# Patient Record
Sex: Female | Born: 1960 | Race: White | Hispanic: No | Smoking: Current every day smoker
Health system: Southern US, Community
[De-identification: ages and names within clinical notes are randomized; demographics above are authoritative.]

## PROBLEM LIST (undated history)

## (undated) DIAGNOSIS — K7460 Unspecified cirrhosis of liver: Secondary | ICD-10-CM

## (undated) DIAGNOSIS — I509 Heart failure, unspecified: Secondary | ICD-10-CM

## (undated) DIAGNOSIS — J449 Chronic obstructive pulmonary disease, unspecified: Secondary | ICD-10-CM

## (undated) DIAGNOSIS — I10 Essential (primary) hypertension: Secondary | ICD-10-CM

## (undated) DIAGNOSIS — E119 Type 2 diabetes mellitus without complications: Secondary | ICD-10-CM

## (undated) DIAGNOSIS — B1920 Unspecified viral hepatitis C without hepatic coma: Secondary | ICD-10-CM

## (undated) DIAGNOSIS — I2510 Atherosclerotic heart disease of native coronary artery without angina pectoris: Secondary | ICD-10-CM

## (undated) HISTORY — PX: BACK SURGERY: SHX140

## (undated) HISTORY — PX: CARDIAC SURGERY: SHX584

---

## 2000-12-27 ENCOUNTER — Encounter: Payer: Self-pay | Admitting: Emergency Medicine

## 2000-12-27 ENCOUNTER — Emergency Department (HOSPITAL_COMMUNITY): Admission: EM | Admit: 2000-12-27 | Discharge: 2000-12-27 | Payer: Self-pay | Admitting: Emergency Medicine

## 2001-01-12 ENCOUNTER — Encounter: Payer: Self-pay | Admitting: Neurosurgery

## 2001-01-12 ENCOUNTER — Ambulatory Visit (HOSPITAL_COMMUNITY): Admission: RE | Admit: 2001-01-12 | Discharge: 2001-01-12 | Payer: Self-pay | Admitting: Neurosurgery

## 2001-05-03 ENCOUNTER — Emergency Department (HOSPITAL_COMMUNITY): Admission: EM | Admit: 2001-05-03 | Discharge: 2001-05-03 | Payer: Self-pay | Admitting: Emergency Medicine

## 2003-03-03 ENCOUNTER — Other Ambulatory Visit: Payer: Self-pay

## 2003-05-27 ENCOUNTER — Other Ambulatory Visit: Payer: Self-pay

## 2003-11-23 ENCOUNTER — Inpatient Hospital Stay (HOSPITAL_COMMUNITY): Admission: RE | Admit: 2003-11-23 | Discharge: 2003-11-24 | Payer: Self-pay | Admitting: Neurosurgery

## 2003-12-29 ENCOUNTER — Encounter: Admission: RE | Admit: 2003-12-29 | Discharge: 2003-12-29 | Payer: Self-pay | Admitting: Neurosurgery

## 2004-01-06 ENCOUNTER — Other Ambulatory Visit: Payer: Self-pay

## 2004-01-16 ENCOUNTER — Encounter: Admission: RE | Admit: 2004-01-16 | Discharge: 2004-01-16 | Payer: Self-pay | Admitting: Neurosurgery

## 2004-02-12 ENCOUNTER — Inpatient Hospital Stay (HOSPITAL_COMMUNITY): Admission: RE | Admit: 2004-02-12 | Discharge: 2004-02-14 | Payer: Self-pay | Admitting: Neurosurgery

## 2004-02-19 ENCOUNTER — Emergency Department: Payer: Self-pay | Admitting: Emergency Medicine

## 2004-04-05 ENCOUNTER — Encounter: Admission: RE | Admit: 2004-04-05 | Discharge: 2004-04-05 | Payer: Self-pay | Admitting: Neurosurgery

## 2004-04-26 ENCOUNTER — Emergency Department (HOSPITAL_COMMUNITY): Admission: EM | Admit: 2004-04-26 | Discharge: 2004-04-26 | Payer: Self-pay | Admitting: Emergency Medicine

## 2004-05-11 ENCOUNTER — Emergency Department: Payer: Self-pay | Admitting: Emergency Medicine

## 2004-05-30 ENCOUNTER — Encounter: Admission: RE | Admit: 2004-05-30 | Discharge: 2004-05-30 | Payer: Self-pay | Admitting: Neurosurgery

## 2004-06-28 ENCOUNTER — Ambulatory Visit: Payer: Self-pay | Admitting: Pain Medicine

## 2004-07-11 ENCOUNTER — Ambulatory Visit: Payer: Self-pay | Admitting: Internal Medicine

## 2004-07-16 ENCOUNTER — Emergency Department: Payer: Self-pay | Admitting: Internal Medicine

## 2004-08-16 ENCOUNTER — Ambulatory Visit: Payer: Self-pay | Admitting: Unknown Physician Specialty

## 2004-08-18 ENCOUNTER — Ambulatory Visit: Payer: Self-pay | Admitting: Unknown Physician Specialty

## 2004-10-02 ENCOUNTER — Encounter: Admission: RE | Admit: 2004-10-02 | Discharge: 2004-10-02 | Payer: Self-pay | Admitting: Neurosurgery

## 2004-10-06 ENCOUNTER — Emergency Department: Payer: Self-pay | Admitting: Emergency Medicine

## 2004-10-18 ENCOUNTER — Ambulatory Visit: Payer: Self-pay | Admitting: Gastroenterology

## 2004-10-24 ENCOUNTER — Encounter: Admission: RE | Admit: 2004-10-24 | Discharge: 2004-10-24 | Payer: Self-pay | Admitting: Neurosurgery

## 2005-02-21 ENCOUNTER — Emergency Department: Payer: Self-pay | Admitting: Emergency Medicine

## 2005-03-11 ENCOUNTER — Emergency Department: Payer: Self-pay | Admitting: Emergency Medicine

## 2005-03-14 ENCOUNTER — Emergency Department: Payer: Self-pay | Admitting: Internal Medicine

## 2005-06-05 ENCOUNTER — Emergency Department: Payer: Self-pay | Admitting: Emergency Medicine

## 2005-06-06 ENCOUNTER — Emergency Department: Payer: Self-pay | Admitting: Unknown Physician Specialty

## 2005-06-06 ENCOUNTER — Other Ambulatory Visit: Payer: Self-pay

## 2005-08-21 ENCOUNTER — Ambulatory Visit: Payer: Self-pay | Admitting: Internal Medicine

## 2005-11-07 ENCOUNTER — Emergency Department: Payer: Self-pay | Admitting: Emergency Medicine

## 2006-03-13 ENCOUNTER — Emergency Department: Payer: Self-pay | Admitting: Emergency Medicine

## 2006-04-11 ENCOUNTER — Ambulatory Visit: Payer: Self-pay | Admitting: Unknown Physician Specialty

## 2006-04-11 ENCOUNTER — Emergency Department: Payer: Self-pay | Admitting: Emergency Medicine

## 2006-04-23 ENCOUNTER — Ambulatory Visit: Payer: Self-pay | Admitting: General Surgery

## 2006-04-25 ENCOUNTER — Emergency Department: Payer: Self-pay | Admitting: Unknown Physician Specialty

## 2006-04-25 ENCOUNTER — Other Ambulatory Visit: Payer: Self-pay

## 2006-04-26 ENCOUNTER — Ambulatory Visit: Payer: Self-pay | Admitting: General Surgery

## 2006-05-26 ENCOUNTER — Emergency Department: Payer: Self-pay | Admitting: Emergency Medicine

## 2006-06-19 ENCOUNTER — Inpatient Hospital Stay: Payer: Self-pay

## 2007-04-16 ENCOUNTER — Ambulatory Visit: Payer: Self-pay | Admitting: Internal Medicine

## 2007-07-11 ENCOUNTER — Other Ambulatory Visit: Payer: Self-pay

## 2007-07-11 ENCOUNTER — Emergency Department: Payer: Self-pay | Admitting: Emergency Medicine

## 2007-09-29 ENCOUNTER — Emergency Department: Payer: Self-pay | Admitting: Emergency Medicine

## 2007-12-24 ENCOUNTER — Ambulatory Visit: Payer: Self-pay | Admitting: Family Medicine

## 2008-02-25 ENCOUNTER — Emergency Department: Payer: Self-pay | Admitting: Emergency Medicine

## 2008-10-08 ENCOUNTER — Inpatient Hospital Stay: Payer: Self-pay | Admitting: Internal Medicine

## 2008-10-08 ENCOUNTER — Ambulatory Visit: Payer: Self-pay | Admitting: Family Medicine

## 2008-12-11 ENCOUNTER — Ambulatory Visit: Payer: Self-pay | Admitting: Specialist

## 2009-01-15 ENCOUNTER — Ambulatory Visit: Payer: Self-pay | Admitting: Family Medicine

## 2009-03-22 ENCOUNTER — Ambulatory Visit: Payer: Self-pay | Admitting: Family Medicine

## 2009-04-28 ENCOUNTER — Emergency Department: Payer: Self-pay | Admitting: Emergency Medicine

## 2009-05-18 ENCOUNTER — Ambulatory Visit: Payer: Self-pay | Admitting: Gastroenterology

## 2009-10-06 ENCOUNTER — Emergency Department: Payer: Self-pay | Admitting: Emergency Medicine

## 2010-02-06 ENCOUNTER — Emergency Department: Payer: Self-pay | Admitting: Emergency Medicine

## 2010-03-23 ENCOUNTER — Ambulatory Visit: Payer: Self-pay | Admitting: Family Medicine

## 2010-03-29 ENCOUNTER — Ambulatory Visit: Payer: Self-pay | Admitting: Family Medicine

## 2010-05-22 ENCOUNTER — Encounter: Payer: Self-pay | Admitting: Neurosurgery

## 2010-06-17 ENCOUNTER — Emergency Department: Payer: Self-pay | Admitting: Emergency Medicine

## 2010-07-04 DIAGNOSIS — R1310 Dysphagia, unspecified: Secondary | ICD-10-CM | POA: Insufficient documentation

## 2010-07-20 ENCOUNTER — Emergency Department: Payer: Self-pay | Admitting: Emergency Medicine

## 2010-08-27 ENCOUNTER — Emergency Department: Payer: Self-pay | Admitting: Emergency Medicine

## 2010-09-15 ENCOUNTER — Ambulatory Visit: Payer: Self-pay | Admitting: Internal Medicine

## 2010-10-31 ENCOUNTER — Ambulatory Visit: Payer: Self-pay | Admitting: Family Medicine

## 2010-12-06 ENCOUNTER — Emergency Department: Payer: Self-pay | Admitting: Emergency Medicine

## 2010-12-15 DIAGNOSIS — F1011 Alcohol abuse, in remission: Secondary | ICD-10-CM | POA: Insufficient documentation

## 2010-12-15 DIAGNOSIS — F1411 Cocaine abuse, in remission: Secondary | ICD-10-CM | POA: Insufficient documentation

## 2011-06-14 DIAGNOSIS — F419 Anxiety disorder, unspecified: Secondary | ICD-10-CM | POA: Insufficient documentation

## 2011-08-26 ENCOUNTER — Emergency Department: Payer: Self-pay | Admitting: Emergency Medicine

## 2011-09-17 ENCOUNTER — Emergency Department: Payer: Self-pay | Admitting: Emergency Medicine

## 2012-01-31 ENCOUNTER — Emergency Department: Payer: Self-pay | Admitting: Emergency Medicine

## 2012-01-31 LAB — CK TOTAL AND CKMB (NOT AT ARMC)
CK, Total: 70 U/L (ref 21–215)
CK-MB: 0.7 ng/mL (ref 0.5–3.6)

## 2012-01-31 LAB — CBC
HGB: 15.7 g/dL (ref 12.0–16.0)
MCH: 29.8 pg (ref 26.0–34.0)
MCHC: 34.8 g/dL (ref 32.0–36.0)
RDW: 14 % (ref 11.5–14.5)

## 2012-01-31 LAB — COMPREHENSIVE METABOLIC PANEL
Bilirubin,Total: 0.4 mg/dL (ref 0.2–1.0)
Chloride: 106 mmol/L (ref 98–107)
Co2: 24 mmol/L (ref 21–32)
Creatinine: 0.84 mg/dL (ref 0.60–1.30)
EGFR (African American): >60
EGFR (Non-African Amer.): >60
Osmolality: 278 (ref 275–301)
SGPT (ALT): 137 U/L — ABNORMAL HIGH (ref 12–78)
Sodium: 140 mmol/L (ref 136–145)

## 2012-03-06 ENCOUNTER — Emergency Department: Payer: Self-pay | Admitting: Internal Medicine

## 2012-04-25 ENCOUNTER — Emergency Department: Payer: Self-pay | Admitting: Emergency Medicine

## 2012-05-09 ENCOUNTER — Ambulatory Visit: Payer: Self-pay | Admitting: Orthopedic Surgery

## 2013-02-07 ENCOUNTER — Emergency Department: Payer: Self-pay | Admitting: Emergency Medicine

## 2013-06-07 ENCOUNTER — Emergency Department: Payer: Self-pay | Admitting: Emergency Medicine

## 2013-08-31 ENCOUNTER — Emergency Department: Payer: Self-pay | Admitting: Emergency Medicine

## 2014-01-20 ENCOUNTER — Ambulatory Visit: Payer: Self-pay | Admitting: Internal Medicine

## 2015-05-24 ENCOUNTER — Emergency Department: Payer: Medicare HMO

## 2015-05-24 ENCOUNTER — Encounter: Payer: Self-pay | Admitting: *Deleted

## 2015-05-24 ENCOUNTER — Emergency Department
Admission: EM | Admit: 2015-05-24 | Discharge: 2015-05-24 | Disposition: A | Discharge status: Home / Self Care | Payer: Medicare HMO | Attending: Emergency Medicine | Admitting: Emergency Medicine

## 2015-05-24 DIAGNOSIS — Z7902 Long term (current) use of antithrombotics/antiplatelets: Secondary | ICD-10-CM | POA: Diagnosis not present

## 2015-05-24 DIAGNOSIS — R51 Headache: Secondary | ICD-10-CM | POA: Insufficient documentation

## 2015-05-24 DIAGNOSIS — E119 Type 2 diabetes mellitus without complications: Secondary | ICD-10-CM | POA: Diagnosis not present

## 2015-05-24 DIAGNOSIS — I10 Essential (primary) hypertension: Secondary | ICD-10-CM | POA: Insufficient documentation

## 2015-05-24 DIAGNOSIS — R42 Dizziness and giddiness: Secondary | ICD-10-CM | POA: Insufficient documentation

## 2015-05-24 DIAGNOSIS — Z91040 Latex allergy status: Secondary | ICD-10-CM | POA: Diagnosis not present

## 2015-05-24 DIAGNOSIS — F17210 Nicotine dependence, cigarettes, uncomplicated: Secondary | ICD-10-CM | POA: Diagnosis not present

## 2015-05-24 DIAGNOSIS — R079 Chest pain, unspecified: Secondary | ICD-10-CM

## 2015-05-24 DIAGNOSIS — Z79899 Other long term (current) drug therapy: Secondary | ICD-10-CM | POA: Insufficient documentation

## 2015-05-24 DIAGNOSIS — J441 Chronic obstructive pulmonary disease with (acute) exacerbation: Secondary | ICD-10-CM | POA: Insufficient documentation

## 2015-05-24 DIAGNOSIS — I509 Heart failure, unspecified: Secondary | ICD-10-CM | POA: Diagnosis not present

## 2015-05-24 HISTORY — DX: Heart failure, unspecified: I50.9

## 2015-05-24 HISTORY — DX: Type 2 diabetes mellitus without complications: E11.9

## 2015-05-24 HISTORY — DX: Essential (primary) hypertension: I10

## 2015-05-24 HISTORY — DX: Chronic obstructive pulmonary disease, unspecified: J44.9

## 2015-05-24 LAB — CBC
HCT: 36.8 % (ref 35.0–47.0)
Hemoglobin: 12.2 g/dL (ref 12.0–16.0)
MCH: 26.8 pg (ref 26.0–34.0)
MCHC: 33 g/dL (ref 32.0–36.0)
MCV: 81.2 fL (ref 80.0–100.0)
PLATELETS: 112 10*3/uL — AB (ref 150–440)
RBC: 4.53 MIL/uL (ref 3.80–5.20)
RDW: 15.7 % — AB (ref 11.5–14.5)
WBC: 7.5 10*3/uL (ref 3.6–11.0)

## 2015-05-24 LAB — TROPONIN I

## 2015-05-24 LAB — BASIC METABOLIC PANEL
Anion gap: 5 (ref 5–15)
BUN: 15 mg/dL (ref 6–20)
CALCIUM: 8 mg/dL — AB (ref 8.9–10.3)
CHLORIDE: 107 mmol/L (ref 101–111)
CO2: 23 mmol/L (ref 22–32)
CREATININE: 0.76 mg/dL (ref 0.44–1.00)
GFR calc non Af Amer: >60 mL/min (ref >60)
Glucose, Bld: 127 mg/dL — ABNORMAL HIGH (ref 65–99)
Potassium: 3.7 mmol/L (ref 3.5–5.1)
SODIUM: 135 mmol/L (ref 135–145)

## 2015-05-24 LAB — PROTIME-INR
INR: 1.19
Prothrombin Time: 15.3 seconds — ABNORMAL HIGH (ref 11.4–15.0)

## 2015-05-24 MED ORDER — MORPHINE SULFATE (PF) 4 MG/ML IV SOLN
4.0000 mg | Freq: Once | INTRAVENOUS | Status: AC
  Administered 2015-05-24: 4 mg via INTRAVENOUS
  Filled 2015-05-24: qty 1

## 2015-05-24 MED ORDER — MORPHINE SULFATE (PF) 4 MG/ML IV SOLN
4.0000 mg | Freq: Once | INTRAVENOUS | Status: AC
Start: 2015-05-24 — End: 2015-05-24
  Administered 2015-05-24: 4 mg via INTRAVENOUS
  Filled 2015-05-24: qty 1

## 2015-05-24 MED ORDER — SODIUM CHLORIDE 0.9 % IV BOLUS (SEPSIS)
500.0000 mL | Freq: Once | INTRAVENOUS | Status: AC
  Administered 2015-05-24: 500 mL via INTRAVENOUS

## 2015-05-24 MED ORDER — IOHEXOL 350 MG/ML SOLN
100.0000 mL | Freq: Once | INTRAVENOUS | Status: AC | PRN
Start: 2015-05-24 — End: 2015-05-24
  Administered 2015-05-24: 100 mL via INTRAVENOUS

## 2015-05-24 MED ORDER — ONDANSETRON HCL 4 MG/2ML IJ SOLN
4.0000 mg | Freq: Once | INTRAMUSCULAR | Status: AC
  Administered 2015-05-24: 4 mg via INTRAVENOUS
  Filled 2015-05-24: qty 2

## 2015-05-24 MED ORDER — ACETAMINOPHEN 500 MG PO TABS
1000.0000 mg | ORAL_TABLET | Freq: Once | ORAL | Status: DC
  Filled 2015-05-24: qty 2

## 2015-05-24 MED ORDER — LORAZEPAM 0.5 MG PO TABS
0.5000 mg | ORAL_TABLET | Freq: Once | ORAL | Status: AC
  Administered 2015-05-24: 0.5 mg via ORAL
  Filled 2015-05-24: qty 1

## 2015-05-24 NOTE — ED Notes (Signed)
Pt is a somewhat poor historian but states she started having chest pain Sat. A.m., resolved w/o intervention. Pt woken from sleep tonight at 0100 w/ chest pain.

## 2015-05-24 NOTE — ED Provider Notes (Signed)
  IMPRESSION: No demonstrable pulmonary embolus. No thoracic aortic aneurysm or dissection.  Patchy bibasilar atelectasis without frank edema or consolidation.  No adenopathy.  Evidence of hepatic cirrhosis. Spleen appears enlarged although incompletely visualized on this current examination.  There are foci of coronary artery calcification.  CT findings as dictated above. Patient will be referred to her primary care doctor for follow-up.  Emily Filbert, MD 05/24/15 [phone]

## 2015-05-24 NOTE — ED Provider Notes (Signed)
[facility] Emergency Department Provider Note  ____________________________________________  Time seen: Approximately 3:30 AM  I have reviewed the triage vital signs and the nursing notes.   HISTORY  Chief Complaint Chest Pain    HPI Meghan Jimenez is a 55 y.o. female who comes into the hospital today with chest pain. The patient reports that the pain started around 9:52 PM. She reports that she was at her daughter's and lay down because she didn't feel well when it just started hurting. She reports that the pain is in the left side of her chest with sharp pain and heaviness. The patient endorses some shortness of breath, sweats and nausea with some dizziness as well. The patient reports that she does not use chronic oxygen but did a few years ago. She is unsure when her last catheter was but has had an MI in the past. She also complains of some occasional numbness in her leg for which she was supposed to receive some studies that have not. The patient reports that due to other circumstances in her life she has been stressed and her pain is currently 8-9 out of 10 in intensity. Patient also complains of a headache at this time. The patient is here for evaluation.   Past Medical History  Diagnosis Date  . Diabetes mellitus without complication (HCC)   . Hypertension   . COPD (chronic obstructive pulmonary disease) (HCC)   . CHF (congestive heart failure) (HCC)     There are no active problems to display for this patient.   Past Surgical History  Procedure Laterality Date  . Cardiac surgery    . Back surgery      Current Outpatient Rx  Name  Route  Sig  Dispense  Refill  . ascorbic acid (VITAMIN C) 1000 MG tablet   Oral   Take 1,000 mg by mouth daily.         . clopidogrel (PLAVIX) 75 MG tablet   Oral   Take 75 mg by mouth daily.         Marland Kitchen docusate sodium (COLACE) 100 MG capsule   Oral   Take 100 mg by mouth 2 (two) times daily.          Marland Kitchen gabapentin (NEURONTIN) 300 MG capsule   Oral   Take 300 mg by mouth 3 (three) times daily.         . hydrOXYzine (ATARAX/VISTARIL) 10 MG tablet   Oral   Take 10 mg by mouth 3 (three) times daily as needed.         . venlafaxine XR (EFFEXOR-XR) 150 MG 24 hr capsule   Oral   Take 150 mg by mouth daily with breakfast.           Allergies Aspirin; Tramadol; and Latex  History reviewed. No pertinent family history.  Social History Social History  Substance Use Topics  . Smoking status: Current Every Day Smoker    Types: Cigarettes  . Smokeless tobacco: Never Used  . Alcohol Use: No    Review of Systems Constitutional: No fever/chills Eyes: No visual changes. ENT: No sore throat. Cardiovascular:  chest pain. Respiratory:  shortness of breath. Gastrointestinal: Nausea with no vomiting or abdominal pain Genitourinary: Negative for dysuria. Musculoskeletal: Negative for back pain. Skin: Negative for rash. Neurological: Headache and dizziness  10-point ROS otherwise negative.  ____________________________________________   PHYSICAL EXAM:  VITAL SIGNS: ED Triage Vitals  Enc Vitals Group     BP 05/24/15 0248  125/89 mmHg     Pulse Rate 05/24/15 0248 101     Resp 05/24/15 0248 15     Temp 05/24/15 0248 98.5 F (36.9 C)     Temp Source 05/24/15 0248 Oral     SpO2 05/24/15 0238 99 %     Weight 05/24/15 0248 200 lb (90.719 kg)     Height 05/24/15 0248  (1.626 m)     Head Cir --      Peak Flow --      Pain Score 05/24/15 0249 8     Pain Loc --      Pain Edu? --      Excl. in GC? --     Constitutional: Alert and oriented. Well appearing and in mild distress. Eyes: Conjunctivae are normal. PERRL. EOMI. Head: Atraumatic. Nose: No congestion/rhinnorhea. Mouth/Throat: Mucous membranes are moist.  Oropharynx non-erythematous. Cardiovascular: Normal rate, regular rhythm. Grossly normal heart sounds.  Good peripheral circulation. Respiratory: Normal  respiratory effort.  No retractions. Lungs CTAB. Gastrointestinal: Soft and nontender. No distention. Positive bowel sounds Musculoskeletal: No lower extremity tenderness nor edema.   Neurologic:  Normal speech and language.  Skin:  Skin is warm, dry and intact.  Psychiatric: Mood and affect are normal.   ____________________________________________   LABS (all labs ordered are listed, but only abnormal results are displayed)  Labs Reviewed  BASIC METABOLIC PANEL - Abnormal; Notable for the following:    Glucose, Bld 127 (*)    Calcium 8.0 (*)    All other components within normal limits  CBC - Abnormal; Notable for the following:    RDW 15.7 (*)    Platelets 112 (*)    All other components within normal limits  PROTIME-INR - Abnormal; Notable for the following:    Prothrombin Time 15.3 (*)    All other components within normal limits  TROPONIN I  TROPONIN I   ____________________________________________  EKG  ED ECG REPORT I, [name], the attending physician, personally viewed and interpreted this ECG.   Date: 05/24/2015  EKG Time: 245  Rate: 100  Rhythm: normal sinus rhythm  Axis: Normal  Intervals:right bundle branch block  ST&T Change: None  ____________________________________________  RADIOLOGY  Chest x-ray: No active cardiopulmonary disease ____________________________________________   PROCEDURES  Procedure(s) performed: None  Critical Care performed: No  ____________________________________________   INITIAL IMPRESSION / ASSESSMENT AND PLAN / ED COURSE  Pertinent labs & imaging results that were available during my care of the patient were reviewed by me and considered in my medical decision making (see chart for details).  This is a 25 rolled female who comes into the hospital today with some chest pain. We will check some blood work and I'll give her dose of morphine and Zofran as she does have a Nitropaste on which is not helping  her pain. I will reassess the patient once I received her blood work as well as repeated her troponin.  The patient continues to have some pain and reports to the nurse that the pain goes to her back. I will perform a CT angios of the patient's chest and have her dispositioned by Dr. Mayford Knife. ____________________________________________   FINAL CLINICAL IMPRESSION(S) / ED DIAGNOSES  Final diagnoses:  Chest pain, unspecified chest pain type      [name], MD 05/24/15 [phone]

## 2015-05-24 NOTE — ED Notes (Signed)
Report received from rachel RN

## 2015-05-24 NOTE — ED Notes (Signed)
Pt to CT scan at this time.

## 2015-05-24 NOTE — Discharge Instructions (Signed)
Nonspecific Chest Pain  °Chest pain can be caused by many different conditions. There is always a chance that your pain could be related to something serious, such as a heart attack or a blood clot in your lungs. Chest pain can also be caused by conditions that are not life-threatening. If you have chest pain, it is very important to follow up with your health care provider. °CAUSES  °Chest pain can be caused by: °· Heartburn. °· Pneumonia or bronchitis. °· Anxiety or stress. °· Inflammation around your heart (pericarditis) or lung (pleuritis or pleurisy). °· A blood clot in your lung. °· A collapsed lung (pneumothorax). It can develop suddenly on its own (spontaneous pneumothorax) or from trauma to the chest. °· Shingles infection (varicella-zoster virus). °· Heart attack. °· Damage to the bones, muscles, and cartilage that make up your chest wall. This can include: °¨ Bruised bones due to injury. °¨ Strained muscles or cartilage due to frequent or repeated coughing or overwork. °¨ Fracture to one or more ribs. °¨ Sore cartilage due to inflammation (costochondritis). °RISK FACTORS  °Risk factors for chest pain may include: °· Activities that increase your risk for trauma or injury to your chest. °· Respiratory infections or conditions that cause frequent coughing. °· Medical conditions or overeating that can cause heartburn. °· Heart disease or family history of heart disease. °· Conditions or health behaviors that increase your risk of developing a blood clot. °· Having had chicken pox (varicella zoster). °SIGNS AND SYMPTOMS °Chest pain can feel like: °· Burning or tingling on the surface of your chest or deep in your chest. °· Crushing, pressure, aching, or squeezing pain. °· Dull or sharp pain that is worse when you move, cough, or take a deep breath. °· Pain that is also felt in your back, neck, shoulder, or arm, or pain that spreads to any of these areas. °Your chest pain may come and go, or it may stay  constant. °DIAGNOSIS °Lab tests or other studies may be needed to find the cause of your pain. Your health care provider may have you take a test called an ambulatory ECG (electrocardiogram). An ECG records your heartbeat patterns at the time the test is performed. You may also have other tests, such as: °· Transthoracic echocardiogram (TTE). During echocardiography, sound waves are used to create a picture of all of the heart structures and to look at how blood flows through your heart. °· Transesophageal echocardiogram (TEE). This is a more advanced imaging test that obtains images from inside your body. It allows your health care provider to see your heart in finer detail. °· Cardiac monitoring. This allows your health care provider to monitor your heart rate and rhythm in real time. °· Holter monitor. This is a portable device that records your heartbeat and can help to diagnose abnormal heartbeats. It allows your health care provider to track your heart activity for several days, if needed. °· Stress tests. These can be done through exercise or by taking medicine that makes your heart beat more quickly. °· Blood tests. °· Imaging tests. °TREATMENT  °Your treatment depends on what is causing your chest pain. Treatment may include: °· Medicines. These may include: °¨ Acid blockers for heartburn. °¨ Anti-inflammatory medicine. °¨ Pain medicine for inflammatory conditions. °¨ Antibiotic medicine, if an infection is present. °¨ Medicines to dissolve blood clots. °¨ Medicines to treat coronary artery disease. °· Supportive care for conditions that do not require medicines. This may include: °¨ Resting. °¨ Applying heat   or cold packs to injured areas. °¨ Limiting activities until pain decreases. °HOME CARE INSTRUCTIONS °· If you were prescribed an antibiotic medicine, finish it all even if you start to feel better. °· Avoid any activities that bring on chest pain. °· Do not use any tobacco products, including  cigarettes, chewing tobacco, or electronic cigarettes. If you need help quitting, ask your health care provider. °· Do not drink alcohol. °· Take medicines only as directed by your health care provider. °· Keep all follow-up visits as directed by your health care provider. This is important. This includes any further testing if your chest pain does not go away. °· If heartburn is the cause for your chest pain, you may be told to keep your head raised (elevated) while sleeping. This reduces the chance that acid will go from your stomach into your esophagus. °· Make lifestyle changes as directed by your health care provider. These may include: °¨ Getting regular exercise. Ask your health care provider to suggest some activities that are safe for you. °¨ Eating a heart-healthy diet. A registered dietitian can help you to learn healthy eating options. °¨ Maintaining a healthy weight. °¨ Managing diabetes, if necessary. °¨ Reducing stress. °SEEK MEDICAL CARE IF: °· Your chest pain does not go away after treatment. °· You have a rash with blisters on your chest. °· You have a fever. °SEEK IMMEDIATE MEDICAL CARE IF:  °· Your chest pain is worse. °· You have an increasing cough, or you cough up blood. °· You have severe abdominal pain. °· You have severe weakness. °· You faint. °· You have chills. °· You have sudden, unexplained chest discomfort. °· You have sudden, unexplained discomfort in your arms, back, neck, or jaw. °· You have shortness of breath at any time. °· You suddenly start to sweat, or your skin gets clammy. °· You feel nauseous or you vomit. °· You suddenly feel light-headed or dizzy. °· Your heart begins to beat quickly, or it feels like it is skipping beats. °These symptoms may represent a serious problem that is an emergency. Do not wait to see if the symptoms will go away. Get medical help right away. Call your local emergency services (911 in the [place] Do not drive yourself to the hospital. °  °This  information is not intended to replace advice given to you by your health care provider. Make sure you discuss any questions you have with your health care provider. °  °Document Released: 01/25/2005 Document Revised: 05/08/2014 Document Reviewed: 11/21/2013 °Elsevier Interactive Patient Education ©2016 Elsevier Inc. ° °

## 2015-05-24 NOTE — ED Notes (Signed)
Pt informed of her pending discharge to home  - I have provided her with a sandwich tray and a phone to call for a ride  Pt states  "I don't want to go - I want to stay here."  Pt educated about troponin and that the doctor has discharge instructions ready   Pt attempting to call and get transportation

## 2015-08-11 ENCOUNTER — Encounter: Payer: Self-pay | Admitting: *Deleted

## 2015-08-11 ENCOUNTER — Emergency Department: Payer: Medicare HMO

## 2015-08-11 ENCOUNTER — Emergency Department
Admission: EM | Admit: 2015-08-11 | Discharge: 2015-08-12 | Disposition: A | Discharge status: Home / Self Care | Payer: Medicare HMO | Attending: Emergency Medicine | Admitting: Emergency Medicine

## 2015-08-11 DIAGNOSIS — I509 Heart failure, unspecified: Secondary | ICD-10-CM | POA: Insufficient documentation

## 2015-08-11 DIAGNOSIS — E119 Type 2 diabetes mellitus without complications: Secondary | ICD-10-CM | POA: Insufficient documentation

## 2015-08-11 DIAGNOSIS — F17210 Nicotine dependence, cigarettes, uncomplicated: Secondary | ICD-10-CM | POA: Insufficient documentation

## 2015-08-11 DIAGNOSIS — Z91040 Latex allergy status: Secondary | ICD-10-CM | POA: Insufficient documentation

## 2015-08-11 DIAGNOSIS — I110 Hypertensive heart disease with heart failure: Secondary | ICD-10-CM | POA: Insufficient documentation

## 2015-08-11 DIAGNOSIS — R0789 Other chest pain: Secondary | ICD-10-CM | POA: Diagnosis present

## 2015-08-11 DIAGNOSIS — R51 Headache: Secondary | ICD-10-CM | POA: Insufficient documentation

## 2015-08-11 DIAGNOSIS — Z7902 Long term (current) use of antithrombotics/antiplatelets: Secondary | ICD-10-CM | POA: Diagnosis not present

## 2015-08-11 DIAGNOSIS — J449 Chronic obstructive pulmonary disease, unspecified: Secondary | ICD-10-CM | POA: Insufficient documentation

## 2015-08-11 DIAGNOSIS — R079 Chest pain, unspecified: Secondary | ICD-10-CM

## 2015-08-11 NOTE — ED Notes (Signed)
Patient transported to X-ray 

## 2015-08-11 NOTE — ED Notes (Addendum)
Pt tearful throughout assessment and reports she "asked God to come for me."

## 2015-08-12 LAB — BASIC METABOLIC PANEL
Anion gap: 6 (ref 5–15)
BUN: 8 mg/dL (ref 6–20)
CHLORIDE: 104 mmol/L (ref 101–111)
CO2: 23 mmol/L (ref 22–32)
CREATININE: 0.59 mg/dL (ref 0.44–1.00)
Calcium: 9 mg/dL (ref 8.9–10.3)
GFR calc Af Amer: >60 mL/min (ref >60)
GFR calc non Af Amer: >60 mL/min (ref >60)
GLUCOSE: 86 mg/dL (ref 65–99)
Potassium: 3.5 mmol/L (ref 3.5–5.1)
Sodium: 133 mmol/L — ABNORMAL LOW (ref 135–145)

## 2015-08-12 LAB — CBC
HEMATOCRIT: 42.4 % (ref 35.0–47.0)
Hemoglobin: 14.3 g/dL (ref 12.0–16.0)
MCH: 25.7 pg — AB (ref 26.0–34.0)
MCHC: 33.7 g/dL (ref 32.0–36.0)
MCV: 76.4 fL — AB (ref 80.0–100.0)
PLATELETS: 101 10*3/uL — AB (ref 150–440)
RBC: 5.55 MIL/uL — ABNORMAL HIGH (ref 3.80–5.20)
RDW: 17.6 % — AB (ref 11.5–14.5)
WBC: 8.2 10*3/uL (ref 3.6–11.0)

## 2015-08-12 LAB — TROPONIN I: Troponin I: <0.03 ng/mL (ref <0.031)

## 2015-08-12 MED ORDER — OXYCODONE-ACETAMINOPHEN 5-325 MG PO TABS
1.0000 | ORAL_TABLET | Freq: Once | ORAL | Status: AC
  Administered 2015-08-12: 1 via ORAL

## 2015-08-12 MED ORDER — OXYCODONE-ACETAMINOPHEN 5-325 MG PO TABS
ORAL_TABLET | ORAL | Status: AC
  Administered 2015-08-12: 1 via ORAL
  Filled 2015-08-12: qty 1

## 2015-08-12 NOTE — Discharge Instructions (Signed)
You have been seen in the emergency department today for chest pain. Your workup has shown normal results. As we discussed please follow-up with your primary care physician in the next 1-2 days for recheck. Return to the emergency department for any further chest pain, trouble breathing, or any other symptom personally concerning to yourself. Please call the number provided for your cardiologist obtain a follow-up appointment as soon as possible for consideration of a stress test.  Nonspecific Chest Pain It is often hard to find the cause of chest pain. There is always a chance that your pain could be related to something serious, such as a heart attack or a blood clot in your lungs. Chest pain can also be caused by conditions that are not life-threatening. If you have chest pain, it is very important to follow up with your doctor.  HOME CARE  If you were prescribed an antibiotic medicine, finish it all even if you start to feel better.  Avoid any activities that cause chest pain.  Do not use any tobacco products, including cigarettes, chewing tobacco, or electronic cigarettes. If you need help quitting, ask your doctor.  Do not drink alcohol.  Take medicines only as told by your doctor.  Keep all follow-up visits as told by your doctor. This is important. This includes any further testing if your chest pain does not go away.  Your doctor may tell you to keep your head raised (elevated) while you sleep.  Make lifestyle changes as told by your doctor. These may include:  Getting regular exercise. Ask your doctor to suggest some activities that are safe for you.  Eating a heart-healthy diet. Your doctor or a diet specialist (dietitian) can help you to learn healthy eating options.  Maintaining a healthy weight.  Managing diabetes, if necessary.  Reducing stress. GET HELP IF:  Your chest pain does not go away, even after treatment.  You have a rash with blisters on your chest.  You  have a fever. GET HELP RIGHT AWAY IF:  Your chest pain is worse.  You have an increasing cough, or you cough up blood.  You have severe belly (abdominal) pain.  You feel extremely weak.  You pass out (faint).  You have chills.  You have sudden, unexplained chest discomfort.  You have sudden, unexplained discomfort in your arms, back, neck, or jaw.  You have shortness of breath at any time.  You suddenly start to sweat, or your skin gets clammy.  You feel nauseous.  You vomit.  You suddenly feel light-headed or dizzy.  Your heart begins to beat quickly, or it feels like it is skipping beats. These symptoms may be an emergency. Do not wait to see if the symptoms will go away. Get medical help right away. Call your local emergency services (911 in the [place] Do not drive yourself to the hospital.   This information is not intended to replace advice given to you by your health care provider. Make sure you discuss any questions you have with your health care provider.   Document Released: 10/04/2007 Document Revised: 05/08/2014 Document Reviewed: 11/21/2013 Elsevier Interactive Patient Education [employer]2016 Else[employer] Inc.

## 2015-08-12 NOTE — ED Provider Notes (Signed)
[facility] Emergency Department Provider Note  Time seen: 12:42 AM  I have reviewed the triage vital signs and the nursing notes.   HISTORY  Chief Complaint Chest Pain and Headache    HPI Meghan Jimenez is a 55 y.o. female with a past medical history of diabetes, hypertension, COPD, CHF, presents the emergency department with intermittent chest pain, back pain, headaches, joint pain. According to the patient for the past 4 days she has been having intermittent chest pain which she describes as a pins and needle tingling sensation in the center of her chest. Denies any chest pain currently. Denies any increased shortness of breath over her baseline, diaphoresis or nausea. Patient is also complaining of back pain and neck pain which she states is due to arthritis. She is also complaining of bilateral knee pain, she states she was told at one time she needs bilateral knee replacements but has not followed up with an orthopedic. These issues do not appear acute. Describes the chest discomfort is moderate when it occurs, gone currently. Patient sees Dr.callwood as her cardiologist, but she has not followed up in some time per patient.     Past Medical History  Diagnosis Date  . Diabetes mellitus without complication (HCC)   . Hypertension   . COPD (chronic obstructive pulmonary disease) (HCC)   . CHF (congestive heart failure) (HCC)     There are no active problems to display for this patient.   Past Surgical History  Procedure Laterality Date  . Cardiac surgery    . Back surgery      Current Outpatient Rx  Name  Route  Sig  Dispense  Refill  . ascorbic acid (VITAMIN C) 1000 MG tablet   Oral   Take 1,000 mg by mouth daily.         . clopidogrel (PLAVIX) 75 MG tablet   Oral   Take 75 mg by mouth daily.         Marland Kitchen docusate sodium (COLACE) 100 MG capsule   Oral   Take 100 mg by mouth 2 (two) times daily.         Marland Kitchen gabapentin (NEURONTIN) 300 MG  capsule   Oral   Take 300 mg by mouth 3 (three) times daily.         . hydrOXYzine (ATARAX/VISTARIL) 10 MG tablet   Oral   Take 10 mg by mouth 3 (three) times daily as needed.         . venlafaxine XR (EFFEXOR-XR) 150 MG 24 hr capsule   Oral   Take 150 mg by mouth daily with breakfast.           Allergies Aspirin; Tramadol; and Latex  History reviewed. No pertinent family history.  Social History Social History  Substance Use Topics  . Smoking status: Current Every Day Smoker    Types: Cigarettes  . Smokeless tobacco: Never Used  . Alcohol Use: No    Review of Systems Constitutional: Negative for fever. Cardiovascular: Intermittent chest pain 4 days Respiratory: No increased shortness of breath Gastrointestinal: Negative for abdominal pain, vomiting and diarrhea. Genitourinary: Negative for dysuria. Musculoskeletal: Positive for back pain, neck pain, bilateral knee pain Neurological: Intermittent headaches. 10-point ROS otherwise negative.  ____________________________________________   PHYSICAL EXAM:  VITAL SIGNS: ED Triage Vitals  Enc Vitals Group     BP 08/11/15 2340 154/83 mmHg     Pulse Rate 08/11/15 2340 97     Resp 08/11/15 2340 21  Temp 08/11/15 2340 98.2 F (36.8 C)     Temp Source 08/11/15 2340 Oral     SpO2 08/11/15 2340 96 %     Weight --      Height --      Head Cir --      Peak Flow --      Pain Score 08/11/15 2336 10     Pain Loc --      Pain Edu? --      Excl. in GC? --     Constitutional: Alert and oriented. Well appearing and in no distress. Eyes: Normal exam ENT   Head: Normocephalic and atraumatic.   Mouth/Throat: Mucous membranes are moist. Cardiovascular: Normal rate, regular rhythm. No murmur Respiratory: Normal respiratory effort without tachypnea nor retractions. Breath sounds are clear  Gastrointestinal: Soft and nontender. No distention.   Musculoskeletal: Nontender with normal range of motion in all  extremities.  Neurologic:  Normal speech and language. No gross focal neurologic deficits  Skin:  Skin is warm, dry and intact.  Psychiatric: Mood and affect are normal.   ____________________________________________    EKG  EKG reviewed and interpreted, so shows normal sinus rhythm at 91 bpm, widened QRS, normal axis, RSR pattern consistent with right bundle branch block. Nonspecific ST changes. No ST elevations.  ____________________________________________    RADIOLOGY  Chest x-ray negative   INITIAL IMPRESSION / ASSESSMENT AND PLAN / ED COURSE  Pertinent labs & imaging results that were available during my care of the patient were reviewed by me and considered in my medical decision making (see chart for details).  Patient presents with various complaints over the most concerning which is 4 days of intermittent chest pain which she describes as a pins and needle sensation. She is also complaining of arthritis type pains to her knees, neck, back, hips. She is asking for pain medication. Patient is not prescribed any chronic pain medication. We'll dose a one-time dose of Percocet in the emergency department while awaiting lab results.  Labs have resulted showing a negative troponin, labs within normal limits. EKG does not appear to show any acute findings per chest x-ray is negative. We will have the patient follow-up with cardiology. Patient is agreeable to plan.  ____________________________________________   FINAL CLINICAL IMPRESSION(S) / ED DIAGNOSES  Chest pain   Minna AntisKevin Ainslee Sou, MD 08/12/15 [phone]

## 2015-11-11 ENCOUNTER — Inpatient Hospital Stay
Admission: EM | Admit: 2015-11-11 | Discharge: 2015-11-12 | DRG: 303 | Disposition: A | Discharge status: Home / Self Care | Payer: Medicare HMO | Attending: Internal Medicine | Admitting: Internal Medicine

## 2015-11-11 DIAGNOSIS — J449 Chronic obstructive pulmonary disease, unspecified: Secondary | ICD-10-CM | POA: Diagnosis present

## 2015-11-11 DIAGNOSIS — F17200 Nicotine dependence, unspecified, uncomplicated: Secondary | ICD-10-CM | POA: Diagnosis present

## 2015-11-11 DIAGNOSIS — K7460 Unspecified cirrhosis of liver: Secondary | ICD-10-CM | POA: Diagnosis present

## 2015-11-11 DIAGNOSIS — Z8249 Family history of ischemic heart disease and other diseases of the circulatory system: Secondary | ICD-10-CM

## 2015-11-11 DIAGNOSIS — K219 Gastro-esophageal reflux disease without esophagitis: Secondary | ICD-10-CM | POA: Diagnosis present

## 2015-11-11 DIAGNOSIS — I25110 Atherosclerotic heart disease of native coronary artery with unstable angina pectoris: Principal | ICD-10-CM | POA: Diagnosis present

## 2015-11-11 DIAGNOSIS — G894 Chronic pain syndrome: Secondary | ICD-10-CM | POA: Diagnosis present

## 2015-11-11 DIAGNOSIS — Z886 Allergy status to analgesic agent status: Secondary | ICD-10-CM | POA: Diagnosis not present

## 2015-11-11 DIAGNOSIS — Z765 Malingerer [conscious simulation]: Secondary | ICD-10-CM | POA: Diagnosis not present

## 2015-11-11 DIAGNOSIS — R072 Precordial pain: Secondary | ICD-10-CM

## 2015-11-11 DIAGNOSIS — I4510 Unspecified right bundle-branch block: Secondary | ICD-10-CM | POA: Diagnosis present

## 2015-11-11 DIAGNOSIS — Z91040 Latex allergy status: Secondary | ICD-10-CM

## 2015-11-11 DIAGNOSIS — Z9889 Other specified postprocedural states: Secondary | ICD-10-CM | POA: Diagnosis not present

## 2015-11-11 DIAGNOSIS — Z955 Presence of coronary angioplasty implant and graft: Secondary | ICD-10-CM

## 2015-11-11 DIAGNOSIS — Z833 Family history of diabetes mellitus: Secondary | ICD-10-CM | POA: Diagnosis not present

## 2015-11-11 DIAGNOSIS — I200 Unstable angina: Secondary | ICD-10-CM | POA: Diagnosis present

## 2015-11-11 DIAGNOSIS — Z885 Allergy status to narcotic agent status: Secondary | ICD-10-CM

## 2015-11-11 DIAGNOSIS — E119 Type 2 diabetes mellitus without complications: Secondary | ICD-10-CM | POA: Diagnosis present

## 2015-11-11 DIAGNOSIS — Z801 Family history of malignant neoplasm of trachea, bronchus and lung: Secondary | ICD-10-CM

## 2015-11-11 DIAGNOSIS — I110 Hypertensive heart disease with heart failure: Secondary | ICD-10-CM | POA: Diagnosis present

## 2015-11-11 DIAGNOSIS — I509 Heart failure, unspecified: Secondary | ICD-10-CM | POA: Diagnosis present

## 2015-11-11 DIAGNOSIS — R079 Chest pain, unspecified: Secondary | ICD-10-CM

## 2015-11-11 HISTORY — DX: Unspecified viral hepatitis C without hepatic coma: B19.20

## 2015-11-11 HISTORY — DX: Unspecified cirrhosis of liver: K74.60

## 2015-11-11 LAB — URINE DRUG SCREEN, QUALITATIVE (ARMC ONLY)
AMPHETAMINES, UR SCREEN: NOT DETECTED
Barbiturates, Ur Screen: NOT DETECTED
Benzodiazepine, Ur Scrn: POSITIVE — AB
CANNABINOID 50 NG, UR ~~LOC~~: NOT DETECTED
COCAINE METABOLITE, UR ~~LOC~~: NOT DETECTED
MDMA (ECSTASY) UR SCREEN: NOT DETECTED
Methadone Scn, Ur: NOT DETECTED
OPIATE, UR SCREEN: NOT DETECTED
PHENCYCLIDINE (PCP) UR S: NOT DETECTED
Tricyclic, Ur Screen: NOT DETECTED

## 2015-11-11 LAB — COMPREHENSIVE METABOLIC PANEL
ALBUMIN: 3.3 g/dL — AB (ref 3.5–5.0)
ALT: 91 U/L — ABNORMAL HIGH (ref 14–54)
ANION GAP: 6 (ref 5–15)
AST: 120 U/L — ABNORMAL HIGH (ref 15–41)
Alkaline Phosphatase: 198 U/L — ABNORMAL HIGH (ref 38–126)
BILIRUBIN TOTAL: 0.8 mg/dL (ref 0.3–1.2)
BUN: 14 mg/dL (ref 6–20)
CALCIUM: 8.9 mg/dL (ref 8.9–10.3)
CO2: 24 mmol/L (ref 22–32)
Chloride: 105 mmol/L (ref 101–111)
Creatinine, Ser: 0.75 mg/dL (ref 0.44–1.00)
GFR calc non Af Amer: >60 mL/min (ref >60)
GLUCOSE: 74 mg/dL (ref 65–99)
POTASSIUM: 3.9 mmol/L (ref 3.5–5.1)
SODIUM: 135 mmol/L (ref 135–145)
TOTAL PROTEIN: 7.6 g/dL (ref 6.5–8.1)

## 2015-11-11 LAB — GLUCOSE, CAPILLARY: Glucose-Capillary: 190 mg/dL — ABNORMAL HIGH (ref 65–99)

## 2015-11-11 LAB — URINALYSIS COMPLETE WITH MICROSCOPIC (ARMC ONLY)
BILIRUBIN URINE: NEGATIVE
Bacteria, UA: NONE SEEN
Glucose, UA: NEGATIVE mg/dL
Hgb urine dipstick: NEGATIVE
KETONES UR: NEGATIVE mg/dL
Nitrite: NEGATIVE
PROTEIN: NEGATIVE mg/dL
SPECIFIC GRAVITY, URINE: 1.008 (ref 1.005–1.030)
pH: 6 (ref 5.0–8.0)

## 2015-11-11 LAB — CBC WITH DIFFERENTIAL/PLATELET
BASOS ABS: 0.1 10*3/uL (ref 0–0.1)
BASOS PCT: 1 %
EOS PCT: 3 %
Eosinophils Absolute: 0.2 10*3/uL (ref 0–0.7)
HCT: 42.3 % (ref 35.0–47.0)
Hemoglobin: 14.6 g/dL (ref 12.0–16.0)
LYMPHS PCT: 23 %
Lymphs Abs: 1.8 10*3/uL (ref 1.0–3.6)
MCH: 27.8 pg (ref 26.0–34.0)
MCHC: 34.4 g/dL (ref 32.0–36.0)
MCV: 80.9 fL (ref 80.0–100.0)
MONO ABS: 0.6 10*3/uL (ref 0.2–0.9)
MONOS PCT: 7 %
Neutro Abs: 5.2 10*3/uL (ref 1.4–6.5)
Neutrophils Relative %: 66 %
PLATELETS: 129 10*3/uL — AB (ref 150–440)
RBC: 5.23 MIL/uL — ABNORMAL HIGH (ref 3.80–5.20)
RDW: 15.9 % — AB (ref 11.5–14.5)
WBC: 7.9 10*3/uL (ref 3.6–11.0)

## 2015-11-11 LAB — PROTIME-INR
INR: 1.14
Prothrombin Time: 14.8 seconds (ref 11.4–15.0)

## 2015-11-11 LAB — TROPONIN I

## 2015-11-11 LAB — MRSA PCR SCREENING: MRSA BY PCR: NEGATIVE

## 2015-11-11 LAB — ETHANOL

## 2015-11-11 LAB — LIPASE, BLOOD: Lipase: 43 U/L (ref 11–51)

## 2015-11-11 LAB — APTT: aPTT: 32 seconds (ref 24–36)

## 2015-11-11 MED ORDER — DOCUSATE SODIUM 100 MG PO CAPS
100.0000 mg | ORAL_CAPSULE | Freq: Two times a day (BID) | ORAL | Status: DC
  Administered 2015-11-11 – 2015-11-12 (×2): 100 mg via ORAL
  Filled 2015-11-11 (×2): qty 1

## 2015-11-11 MED ORDER — HEPARIN BOLUS VIA INFUSION
4000.0000 [IU] | Freq: Once | INTRAVENOUS | Status: AC
  Administered 2015-11-11: 4000 [IU] via INTRAVENOUS
  Filled 2015-11-11: qty 4000

## 2015-11-11 MED ORDER — GABAPENTIN 300 MG PO CAPS
300.0000 mg | ORAL_CAPSULE | Freq: Three times a day (TID) | ORAL | Status: DC
  Administered 2015-11-11 – 2015-11-12 (×3): 300 mg via ORAL
  Filled 2015-11-11 (×3): qty 1

## 2015-11-11 MED ORDER — HYDROXYZINE HCL 10 MG PO TABS
10.0000 mg | ORAL_TABLET | Freq: Three times a day (TID) | ORAL | Status: DC | PRN
  Administered 2015-11-11: 10 mg via ORAL
  Filled 2015-11-11 (×2): qty 1

## 2015-11-11 MED ORDER — MORPHINE SULFATE (PF) 2 MG/ML IV SOLN
2.0000 mg | INTRAVENOUS | Status: DC | PRN
  Administered 2015-11-11 – 2015-11-12 (×5): 2 mg via INTRAVENOUS
  Filled 2015-11-11 (×5): qty 1

## 2015-11-11 MED ORDER — CLOPIDOGREL BISULFATE 75 MG PO TABS
75.0000 mg | ORAL_TABLET | Freq: Every day | ORAL | Status: DC
  Administered 2015-11-12: 75 mg via ORAL
  Filled 2015-11-11: qty 1

## 2015-11-11 MED ORDER — NITROGLYCERIN IN D5W 200-5 MCG/ML-% IV SOLN
0.0000 ug/min | INTRAVENOUS | Status: DC
  Administered 2015-11-12: 10 ug/min via INTRAVENOUS

## 2015-11-11 MED ORDER — NITROGLYCERIN IN D5W 200-5 MCG/ML-% IV SOLN
0.0000 ug/min | Freq: Once | INTRAVENOUS | Status: AC
Start: 2015-11-11 — End: 2015-11-11
  Administered 2015-11-11: 20 ug/min via INTRAVENOUS
  Filled 2015-11-11: qty 250

## 2015-11-11 MED ORDER — SODIUM CHLORIDE 0.9 % IV BOLUS (SEPSIS)
1000.0000 mL | Freq: Once | INTRAVENOUS | Status: AC
  Administered 2015-11-11: 1000 mL via INTRAVENOUS

## 2015-11-11 MED ORDER — DIPHENHYDRAMINE HCL 25 MG PO CAPS
50.0000 mg | ORAL_CAPSULE | Freq: Four times a day (QID) | ORAL | Status: DC | PRN
  Administered 2015-11-11 – 2015-11-12 (×2): 50 mg via ORAL
  Filled 2015-11-11 (×2): qty 2

## 2015-11-11 MED ORDER — INSULIN ASPART 100 UNIT/ML ~~LOC~~ SOLN: 0.0000 [IU] | Freq: Every day | SUBCUTANEOUS | Status: DC

## 2015-11-11 MED ORDER — OXYCODONE HCL 5 MG PO TABS
5.0000 mg | ORAL_TABLET | ORAL | Status: DC | PRN
  Administered 2015-11-11 – 2015-11-12 (×3): 5 mg via ORAL
  Filled 2015-11-11 (×3): qty 1

## 2015-11-11 MED ORDER — VENLAFAXINE HCL ER 75 MG PO CP24
150.0000 mg | ORAL_CAPSULE | Freq: Every day | ORAL | Status: DC
  Administered 2015-11-12: 150 mg via ORAL
  Filled 2015-11-11: qty 2
  Filled 2015-11-11: qty 1

## 2015-11-11 MED ORDER — VITAMIN C 500 MG PO TABS
1000.0000 mg | ORAL_TABLET | Freq: Every day | ORAL | Status: DC
  Administered 2015-11-12: 1000 mg via ORAL
  Filled 2015-11-11: qty 2

## 2015-11-11 MED ORDER — INSULIN ASPART 100 UNIT/ML ~~LOC~~ SOLN: 0.0000 [IU] | Freq: Three times a day (TID) | SUBCUTANEOUS | Status: DC

## 2015-11-11 MED ORDER — SODIUM CHLORIDE 0.9% FLUSH
3.0000 mL | Freq: Two times a day (BID) | INTRAVENOUS | Status: DC
  Administered 2015-11-12: 14:00:00 via INTRAVENOUS

## 2015-11-11 MED ORDER — METOPROLOL TARTRATE 25 MG PO TABS
12.5000 mg | ORAL_TABLET | Freq: Two times a day (BID) | ORAL | Status: DC
  Administered 2015-11-11: 12.5 mg via ORAL
  Filled 2015-11-11: qty 1

## 2015-11-11 MED ORDER — CETYLPYRIDINIUM CHLORIDE 0.05 % MT LIQD
7.0000 mL | Freq: Two times a day (BID) | OROMUCOSAL | Status: DC
  Administered 2015-11-12: 7 mL via OROMUCOSAL

## 2015-11-11 MED ORDER — HEPARIN (PORCINE) IN NACL 100-0.45 UNIT/ML-% IJ SOLN
850.0000 [IU]/h | INTRAMUSCULAR | Status: DC
  Administered 2015-11-11: 850 [IU]/h via INTRAVENOUS
  Filled 2015-11-11 (×2): qty 250

## 2015-11-11 NOTE — Progress Notes (Signed)
ANTICOAGULATION CONSULT NOTE - Initial Consult  Pharmacy Consult for heparin drip Indication: chest pain/ACS  Allergies  Allergen Reactions  . Aspirin Anaphylaxis and Palpitations  . Tramadol Anaphylaxis  . Latex   . Vicodin [Hydrocodone-Acetaminophen] Palpitations   Patient Measurements: Heparin Dosing Weight: 73kg  (actualy weight 85 kg)  Vital Signs: Temp: 98 F (36.7 C) (07/13 1640) Temp Source: Oral (07/13 1640) BP: 134/91 mmHg (07/13 1640) Pulse Rate: 84 (07/13 1640)  Labs:  Recent Labs  11/11/15 1702  HGB 14.6  HCT 42.3  PLT 129*    CrCl cannot be calculated (Unknown ideal weight.).   Medical History: Past Medical History  Diagnosis Date  . Diabetes mellitus without complication (HCC)   . Hypertension   . COPD (chronic obstructive pulmonary disease) (HCC)   . CHF (congestive heart failure) (HCC)     Assessment: 55 yo female with chest pain. Pharmacy consulted for dosing and monitoring of heparin.   Goal of Therapy:  Heparin level 0.3-0.7 units/ml Monitor platelets by anticoagulation protocol: Yes   Plan:  Give 4000 units bolus x 1 Start heparin infusion at 850 units/hr Check anti-Xa level in 6 hours and daily while on heparin Continue to monitor H&H and platelets  Cher NakaiSheema Diania Co, PharmD Clinical Pharmacist 11/11/2015 5:43 PM

## 2015-11-11 NOTE — ED Provider Notes (Addendum)
[facility] Emergency Department Provider Note  ____________________________________________  Time seen: 4:35 PM  I have reviewed the triage vital signs and the nursing notes.   HISTORY  Chief Complaint Chest Pain    HPI Meghan Jimenez is a 55 y.o. female sent to ED from primary care office for chest pain. Patient notes that the last 2 days she's been having intermittent aching pain in the left side of the chest radiating down the left arm associated with shortness of breath and diaphoresis. Feels like chest pain she had a year ago when she had an heart attack requiring a stent. She has been compliant with Plavix. He reports that the pain has been worsened by exertion over the last few days. She reports being under a lot of stress recently due to the death of her nephew. Pain is severe in intensity. Primary care gave 2 sublingual nitroglycerin without any effect. Patient has a documented anaphylactic reaction to aspirin so none was given. She reports compliance with her Plavix..     Past Medical History  Diagnosis Date  . Diabetes mellitus without complication (HCC)   . Hypertension   . COPD (chronic obstructive pulmonary disease) (HCC)   . CHF (congestive heart failure) (HCC)      There are no active problems to display for this patient.    Past Surgical History  Procedure Laterality Date  . Cardiac surgery    . Back surgery       Current Outpatient Rx  Name  Route  Sig  Dispense  Refill  . ascorbic acid (VITAMIN C) 1000 MG tablet   Oral   Take 1,000 mg by mouth daily.         . clopidogrel (PLAVIX) 75 MG tablet   Oral   Take 75 mg by mouth daily.         . diphenhydrAMINE (BENADRYL) 50 MG capsule   Oral   Take 50 mg by mouth every 6 (six) hours as needed.         . docusate sodium (COLACE) 100 MG capsule   Oral   Take 100 mg by mouth 2 (two) times daily.         Marland Kitchen gabapentin (NEURONTIN) 300 MG capsule   Oral   Take 300  mg by mouth 3 (three) times daily.         . hydrOXYzine (ATARAX/VISTARIL) 10 MG tablet   Oral   Take 10 mg by mouth 3 (three) times daily as needed.         Marland Kitchen ibuprofen (ADVIL,MOTRIN) 200 MG tablet   Oral   Take 200 mg by mouth every 6 (six) hours as needed.         . venlafaxine XR (EFFEXOR-XR) 150 MG 24 hr capsule   Oral   Take 150 mg by mouth daily with breakfast.            Allergies Aspirin; Tramadol; Latex; and Vicodin   No family history on file.  Social History Social History  Substance Use Topics  . Smoking status: Current Every Day Smoker    Types: Cigarettes  . Smokeless tobacco: Never Used  . Alcohol Use: No    Review of Systems  Constitutional:   No fever or chills.  ENT:   No sore throat. No rhinorrhea. Cardiovascular:   Positive as above chest pain. Respiratory:   Positive shortness of breath with the chest pain. Gastrointestinal:   Negative for abdominal pain, vomiting and diarrhea.  Genitourinary:   Negative for dysuria or difficulty urinating. Musculoskeletal:   Negative for focal pain or swelling Neurological:   Negative for headaches 10-point ROS otherwise negative.  ____________________________________________   PHYSICAL EXAM:  VITAL SIGNS: ED Triage Vitals  Enc Vitals Group     BP 11/11/15 1640 134/91 mmHg     Pulse Rate 11/11/15 1640 84     Resp 11/11/15 1640 16     Temp 11/11/15 1640 98 F (36.7 C)     Temp Source 11/11/15 1640 Oral     SpO2 11/11/15 1640 97 %     Weight 11/11/15 1742 187 lb (84.823 kg)     Height --      Head Cir --      Peak Flow --      Pain Score 11/11/15 1752 9     Pain Loc --      Pain Edu? --      Excl. in GC? --     Vital signs reviewed, nursing assessments reviewed.   Constitutional:   Alert and oriented. Well appearing and in no distress. Eyes:   No scleral icterus. No conjunctival pallor. PERRL. EOMI.  No nystagmus. ENT   Head:   Normocephalic and atraumatic.   Nose:   No  congestion/rhinnorhea. No septal hematoma   Mouth/Throat:   MMM, no pharyngeal erythema. No peritonsillar mass.    Neck:   No stridor. No SubQ emphysema. No meningismus. Hematological/Lymphatic/Immunilogical:   No cervical lymphadenopathy. Cardiovascular:   RRR. Symmetric bilateral radial and DP pulses.  No murmurs.  Respiratory:   Normal respiratory effort without tachypnea nor retractions. Breath sounds are clear and equal bilaterally. No wheezes/rales/rhonchi. Gastrointestinal:   Soft and nontender. Non distended. There is no CVA tenderness.  No rebound, rigidity, or guarding. Genitourinary:   deferred Musculoskeletal:   Nontender with normal range of motion in all extremities. No joint effusions.  No lower extremity tenderness.  No edema.Chest pain not reproducible with chest wall palpation Neurologic:   Normal speech and language.  CN 2-10 normal. Motor grossly intact. No gross focal neurologic deficits are appreciated.  Skin:    Skin is warm, dry and intact. No rash noted.  No petechiae, purpura, or bullae.  ____________________________________________    LABS (pertinent positives/negatives) (all labs ordered are listed, but only abnormal results are displayed) Labs Reviewed  COMPREHENSIVE METABOLIC PANEL - Abnormal; Notable for the following:    Albumin 3.3 (*)    AST 120 (*)    ALT 91 (*)    Alkaline Phosphatase 198 (*)    All other components within normal limits  URINALYSIS COMPLETEWITH MICROSCOPIC ([facility] ONLY) - Abnormal; Notable for the following:    Color, Urine YELLOW (*)    APPearance CLEAR (*)    Leukocytes, UA 2+ (*)    Squamous Epithelial / LPF 0-5 (*)    All other components within normal limits  URINE DRUG SCREEN, QUALITATIVE ([facility] ONLY) - Abnormal; Notable for the following:    Benzodiazepine, Ur Scrn POSITIVE (*)    All other components within normal limits  CBC WITH DIFFERENTIAL/PLATELET - Abnormal; Notable for the following:    RBC 5.23 (*)    RDW  15.9 (*)    Platelets 129 (*)    All other components within normal limits  LIPASE, BLOOD  ETHANOL  TROPONIN I  APTT  PROTIME-INR  HEPARIN LEVEL (UNFRACTIONATED)   ____________________________________________   EKG  Interpreted by me ED EKG at 4:48 PM Sinus rhythm rate of  84, normal axis and intervals. Right bundle branch block. Inferior Q waves. No acute ST or T-wave changes. Isolated T-wave inversion in aVL which is nonspecific.  EKG performed by EMS at 4:02 PM not significantly different from ED EKG  EKG performed by primary care clinic at 2:39 PM not significantly different from ED EKG  Prior EKG provided by primary care clinic, dated 12/17/2013, not significantly different.  ____________________________________________    RADIOLOGY    ____________________________________________   PROCEDURES CRITICAL CARE Performed by: Scotty Court, Enna Warwick   Total critical care time: 35 minutes  Critical care time was exclusive of separately billable procedures and treating other patients.  Critical care was necessary to treat or prevent imminent or life-threatening deterioration.  Critical care was time spent personally by me on the following activities: development of treatment plan with patient and/or surrogate as well as nursing, discussions with consultants, evaluation of patient's response to treatment, examination of patient, obtaining history from patient or surrogate, ordering and performing treatments and interventions, ordering and review of laboratory studies, ordering and review of radiographic studies, pulse oximetry and re-evaluation of patient's condition.   ____________________________________________   INITIAL IMPRESSION / ASSESSMENT AND PLAN / ED COURSE  Pertinent labs & imaging results that were available during my care of the patient were reviewed by me and considered in my medical decision making (see chart for details).  Patient presents with  exertional chest pain, accelerating pattern, ongoing despite sublingual nitroglycerin. Due to history of MI and stent and other comorbidities, patient is high risk for ACS given this symptom pattern. Patient started on nitroglycerin infusion to titrate to chest pain-free, as well as heparin infusion. We'll plan for admission to the hospital for further monitoring.   ____________________________________________   FINAL CLINICAL IMPRESSION(S) / ED DIAGNOSES  Final diagnoses:  Precordial chest pain  Unstable angina (HCC)       Portions of this note were generated with dragon dictation software. Dictation errors may occur despite best attempts at proofreading.   Sharman Cheek, MD 11/11/15 1807  Sharman Cheek, MD 11/11/15 [phone]

## 2015-11-11 NOTE — Care Management Note (Signed)
Meghan Jimenez stated that she was still in intense pain but welcomed and appreciated prayer and counseling. Chaplain spent some time with her and answered some of her concerns in relation to mortality and spirituality. Meghan Jimenez seems to have peace in regards to her current state and potential outcomes. Chaplain will continue to visit with her throughout her time in our care.

## 2015-11-11 NOTE — H&P (Addendum)
[facility] [place] at [facility]   PATIENT NAME: Meghan Jimenez    MR#:  [id number]  DATE OF BIRTH:  12/23/1960  DATE OF ADMISSION:  11/11/2015  PRIMARY CARE PHYSICIAN: [facility]   REQUESTING/REFERRING PHYSICIAN: Sharman Cheek  CHIEF COMPLAINT:   Chief Complaint  Patient presents with  . Chest Pain    HISTORY OF PRESENT ILLNESS:  Meghan Jimenez  is a 55 y.o. female with a known history of Coronary artery disease [place] She presents with 2 days of chest pain on and off lasting about 30 minutes at a time. She describes the pain as knifelike 8-9 out of 10 in intensity. Also gets hot and sweaty nauseous and short of breath. He radiates to her left arm and her left arm is numb. Last night she took a anxiety pill to sleep. She's been under a lot of stress lately. The first troponin in the ER was negative and ER physician put on a heparin drip and nitroglycerin drip. The patient still complains of a nagging pain in the center of her chest. Of note, she did have a cardiac MRI on 06/17/2014 that was negative.  PAST MEDICAL HISTORY:   Past Medical History  Diagnosis Date  . Diabetes mellitus without complication (HCC)   . Hypertension   . COPD (chronic obstructive pulmonary disease) (HCC)   . CHF (congestive heart failure) (HCC)     PAST SURGICAL HISTORY:   Past Surgical History  Procedure Laterality Date  . Cardiac surgery    . Back surgery      SOCIAL HISTORY:   Social History  Substance Use Topics  . Smoking status: Current Every Day Smoker    Types: Cigarettes  . Smokeless tobacco: Never Used  . Alcohol Use: No    FAMILY HISTORY:   Family History  Problem Relation Age of Onset  . CAD Mother   . Diabetes Mother   . Lung cancer Father     DRUG ALLERGIES:   Allergies  Allergen Reactions  . Aspirin Anaphylaxis and Palpitations  . Tramadol Anaphylaxis  . Latex   . Vicodin  [Hydrocodone-Acetaminophen] Palpitations    REVIEW OF SYSTEMS:  CONSTITUTIONAL: No fever. Positive for chills and sweats. Positive for fatigue. Positive for weight gain 50 pounds. EYES: Decreased vision EARS, NOSE, AND THROAT: No tinnitus or ear pain. No sore throat. Decreased hearing. Dysphagia to liquids. RESPIRATORY: No cough, positive for shortness of breath, no wheezing or hemoptysis.  CARDIOVASCULAR: Positive for chest pain. No orthopnea, edema.  GASTROINTESTINAL: Positive for nausea. No vomiting, diarrhea. Occasional abdominal pain. No blood in bowel movements. History of constipation GENITOURINARY: No dysuria. Occasional hematuria.  ENDOCRINE: No polyuria, nocturia,  HEMATOLOGY: No anemia, easy bruising or bleeding SKIN: No rash or lesion. Positive for itching MUSCULOSKELETAL: Positive for low back pain.   NEUROLOGIC: Numbness left arm. Lightheaded feeling PSYCHIATRY: Positive for anxiety and depression.   MEDICATIONS AT HOME:   Prior to Admission medications   Medication Sig Start Date End Date Taking? Authorizing Provider  ascorbic acid (VITAMIN C) 1000 MG tablet Take 1,000 mg by mouth daily.   Yes Historical Provider, MD  clopidogrel (PLAVIX) 75 MG tablet Take 75 mg by mouth daily.   Yes Historical Provider, MD  diphenhydrAMINE (BENADRYL) 50 MG capsule Take 50 mg by mouth every 6 (six) hours as needed.   Yes Historical Provider, MD  docusate sodium (COLACE) 100 MG capsule Take 100 mg  by mouth 2 (two) times daily.   Yes Historical Provider, MD  gabapentin (NEURONTIN) 300 MG capsule Take 300 mg by mouth 3 (three) times daily.   Yes Historical Provider, MD  hydrOXYzine (ATARAX/VISTARIL) 10 MG tablet Take 10 mg by mouth 3 (three) times daily as needed.   Yes Historical Provider, MD  ibuprofen (ADVIL,MOTRIN) 200 MG tablet Take 200 mg by mouth every 6 (six) hours as needed.   Yes Historical Provider, MD  venlafaxine XR (EFFEXOR-XR) 150 MG 24 hr capsule Take 150 mg by mouth daily  with breakfast.   Yes Historical Provider, MD      VITAL SIGNS:  Blood pressure 113/90, pulse 80, temperature 98 F (36.7 C), temperature source Oral, resp. rate 18, weight 84.823 kg (187 lb), SpO2 97 %.  PHYSICAL EXAMINATION:  GENERAL:  55 y.o.-year-old patient lying in the bed with no acute distress.  EYES: Pupils equal, round, reactive to light and accommodation. No scleral icterus. Extraocular muscles intact.  HEENT: Head atraumatic, normocephalic. Oropharynx and nasopharynx clear.  NECK:  Supple, no jugular venous distention. No thyroid enlargement, no tenderness.  LUNGS: Normal breath sounds bilaterally, no wheezing, rales,rhonchi or crepitation. No use of accessory muscles of respiration.  CARDIOVASCULAR: S1, S2 normal. No murmurs, rubs, or gallops. Some pain to palpation over the chest wall ABDOMEN: Soft, slight epigastric tenderness, nondistended. Bowel sounds present. No organomegaly or mass.  EXTREMITIES: No pedal edema, cyanosis, or clubbing.  NEUROLOGIC: Cranial nerves II through XII are intact. Muscle strength 5/5 in all extremities. Sensation intact. Gait not checked.  PSYCHIATRIC: The patient is alert and oriented x 3.  SKIN: No rash, lesion, or ulcer.   LABORATORY PANEL:   CBC  Recent Labs Lab 11/11/15 1702  WBC 7.9  HGB 14.6  HCT 42.3  PLT 129*   ------------------------------------------------------------------------------------------------------------------  Chemistries   Recent Labs Lab 11/11/15 1702  NA 135  K 3.9  CL 105  CO2 24  GLUCOSE 74  BUN 14  CREATININE 0.75  CALCIUM 8.9  AST 120*  ALT 91*  ALKPHOS 198*  BILITOT 0.8   ------------------------------------------------------------------------------------------------------------------  Cardiac Enzymes  Recent Labs Lab 11/11/15 1702  TROPONINI <0.03   ------------------------------------------------------------------------------------------------------------------  RADIOLOGY:   Chest x-ray ordered by me  EKG:   Normal sinus rhythm 84 bpm right bundle branch block  IMPRESSION AND PLAN:   1. Chest pain concerning for unstable angina. ER physician started on heparin drip and nitroglycerin drip. The patient did have a negative cardiac MRI last year. Case discussed with Dr. Gwen PoundsKowalski covering for Dr. Juliann Paresallwood to see the patient in consultation and decide on further testing. We'll get serial cardiac enzymes, monitor on telemetry. Continue Plavix and start statin. Patient has an allergy listed to aspirin. 2. Type 2 diabetes mellitus. We'll put on sliding scale and check a hemoglobin A1c 3. Anxiety depression continue psychiatric medications 4. Liver cirrhosis seen on CAT scan of the chest previously. Hepatitis C, thrombocytopenia, elevated liver function tests. 5. Tobacco abuse. Patient states that she is allergic to nicotine patch. She smokes 1-1/2 packs per day. Smoking cessation counseling done 3 minutes by me.  All the records are reviewed and case discussed with ED provider. Management plans discussed with the patient, family and they are in agreement.  CODE STATUS: Full code  TOTAL TIME TAKING CARE OF THIS PATIENT: 50 minutes.    Alford HighlandWIETING, Jairon Ripberger M.D on 11/11/2015 at 7:04 PM  Between 7am to 6pm - Pager - [phone]  After 6pm call admission pager [phone]  [facility] Office  [phone]  CC: Primary care physician; [facility]

## 2015-11-11 NOTE — ED Notes (Signed)
Pt states some sob cp 10/10 left side. HA from nitro drip 10/10. Requesting something to eat. Will check orders for pain meds and something to eat.

## 2015-11-11 NOTE — ED Notes (Signed)
[facility] c/o chest pain for the past 3 days. Pt reports sharp pain in left side of chest traveling to left arm. Pt history of MI last year and had stent placed.

## 2015-11-12 ENCOUNTER — Inpatient Hospital Stay: Payer: Medicare HMO

## 2015-11-12 ENCOUNTER — Encounter: Payer: Self-pay | Admitting: Radiology

## 2015-11-12 ENCOUNTER — Inpatient Hospital Stay
Admit: 2015-11-12 | Discharge: 2015-11-12 | Disposition: A | Discharge status: Home / Self Care | Payer: Medicare HMO | Attending: Internal Medicine | Admitting: Internal Medicine

## 2015-11-12 LAB — CBC
HEMATOCRIT: 38 % (ref 35.0–47.0)
HEMOGLOBIN: 13.1 g/dL (ref 12.0–16.0)
MCH: 28 pg (ref 26.0–34.0)
MCHC: 34.4 g/dL (ref 32.0–36.0)
MCV: 81.3 fL (ref 80.0–100.0)
Platelets: 98 10*3/uL — ABNORMAL LOW (ref 150–440)
RBC: 4.68 MIL/uL (ref 3.80–5.20)
RDW: 16 % — AB (ref 11.5–14.5)
WBC: 7.5 10*3/uL (ref 3.6–11.0)

## 2015-11-12 LAB — NM MYOCAR MULTI W/SPECT W/WALL MOTION / EF
CHL CUP NUCLEAR SRS: 0
CHL CUP NUCLEAR SSS: 0
CHL CUP RESTING HR STRESS: 71 {beats}/min
CHL CUP STRESS STAGE 1 GRADE: 0 %
CHL CUP STRESS STAGE 2 GRADE: 0 %
CHL CUP STRESS STAGE 2 HR: 79 {beats}/min
CHL CUP STRESS STAGE 3 GRADE: 0 %
CHL CUP STRESS STAGE 3 HR: 83 {beats}/min
CHL CUP STRESS STAGE 4 SPEED: 0 mph
CSEPED: 1 min
CSEPEDS: 1 s
CSEPEW: 1 METS
CSEPPHR: 83 {beats}/min
LVDIAVOL: 63 mL (ref 46–106)
LVSYSVOL: 19 mL
NUC STRESS TID: 1.03
Percent of predicted max HR: 50 %
SDS: 0
Stage 1 HR: 80 {beats}/min
Stage 1 Speed: 0 mph
Stage 2 Speed: 0 mph
Stage 3 Speed: 0 mph
Stage 4 DBP: 51 mmHg
Stage 4 Grade: 0 %
Stage 4 HR: 84 {beats}/min
Stage 4 SBP: 107 mmHg

## 2015-11-12 LAB — BASIC METABOLIC PANEL
Anion gap: 4 — ABNORMAL LOW (ref 5–15)
BUN: 14 mg/dL (ref 6–20)
CHLORIDE: 106 mmol/L (ref 101–111)
CO2: 26 mmol/L (ref 22–32)
Calcium: 8.4 mg/dL — ABNORMAL LOW (ref 8.9–10.3)
Creatinine, Ser: 0.6 mg/dL (ref 0.44–1.00)
GFR calc Af Amer: >60 mL/min (ref >60)
GFR calc non Af Amer: >60 mL/min (ref >60)
GLUCOSE: 122 mg/dL — AB (ref 65–99)
POTASSIUM: 3.9 mmol/L (ref 3.5–5.1)
Sodium: 136 mmol/L (ref 135–145)

## 2015-11-12 LAB — GLUCOSE, CAPILLARY
GLUCOSE-CAPILLARY: 98 mg/dL (ref 65–99)
GLUCOSE-CAPILLARY: 72 mg/dL (ref 65–99)
Glucose-Capillary: 75 mg/dL (ref 65–99)
Glucose-Capillary: 85 mg/dL (ref 65–99)

## 2015-11-12 LAB — LIPID PANEL
Cholesterol: 167 mg/dL (ref 0–200)
HDL: 32 mg/dL — AB (ref >40)
LDL CALC: 112 mg/dL — AB (ref 0–99)
Total CHOL/HDL Ratio: 5.2 RATIO
Triglycerides: 113 mg/dL (ref <150)
VLDL: 23 mg/dL (ref 0–40)

## 2015-11-12 LAB — HEMOGLOBIN A1C: HEMOGLOBIN A1C: 5.5 % (ref 4.0–6.0)

## 2015-11-12 LAB — HEPARIN LEVEL (UNFRACTIONATED)
HEPARIN UNFRACTIONATED: 0.3 [IU]/mL (ref 0.30–0.70)
Heparin Unfractionated: 0.34 IU/mL (ref 0.30–0.70)

## 2015-11-12 LAB — TROPONIN I
Troponin I: <0.03 ng/mL (ref <0.03)
Troponin I: <0.03 ng/mL (ref <0.03)

## 2015-11-12 MED ORDER — IBUPROFEN 200 MG PO TABS: 200.0000 mg | ORAL_TABLET | Freq: Four times a day (QID) | ORAL | Status: DC | PRN

## 2015-11-12 MED ORDER — MORPHINE SULFATE (PF) 2 MG/ML IV SOLN
2.0000 mg | INTRAVENOUS | Status: DC | PRN
  Administered 2015-11-12: 2 mg via INTRAVENOUS
  Filled 2015-11-12: qty 1

## 2015-11-12 MED ORDER — ENOXAPARIN SODIUM 40 MG/0.4ML ~~LOC~~ SOLN: 40.0000 mg | SUBCUTANEOUS | Status: DC

## 2015-11-12 MED ORDER — REGADENOSON 0.4 MG/5ML IV SOLN
0.4000 mg | Freq: Once | INTRAVENOUS | Status: AC
  Administered 2015-11-12: 0.4 mg via INTRAVENOUS

## 2015-11-12 MED ORDER — IBUPROFEN 400 MG PO TABS: 400.0000 mg | ORAL_TABLET | Freq: Four times a day (QID) | ORAL | Status: DC | PRN

## 2015-11-12 MED ORDER — TECHNETIUM TC 99M TETROFOSMIN IV KIT
30.7300 | PACK | Freq: Once | INTRAVENOUS | Status: AC | PRN
  Administered 2015-11-12: 30.73 via INTRAVENOUS

## 2015-11-12 MED ORDER — KETOROLAC TROMETHAMINE 15 MG/ML IJ SOLN
15.0000 mg | Freq: Four times a day (QID) | INTRAMUSCULAR | Status: DC | PRN
  Administered 2015-11-12: 15 mg via INTRAVENOUS
  Filled 2015-11-12: qty 1

## 2015-11-12 MED ORDER — TECHNETIUM TC 99M TETROFOSMIN IV KIT
13.0000 | PACK | Freq: Once | INTRAVENOUS | Status: AC | PRN
  Administered 2015-11-12: 12.95 via INTRAVENOUS

## 2015-11-12 NOTE — Progress Notes (Signed)
LCSW has collected data on patient and will complete assessment. LCSW will provide patient with transportation resources and follow up psychiatric walk in clinic to assist patient with her depression and MHI. Written resources provided. No further needs  Delta Air LinesClaudine Jonte Wollam LCSW [phone]

## 2015-11-12 NOTE — Progress Notes (Signed)
Patient back from stress test, co pain 10/10, medicated IV and po with very limited relief, Md paged and discussed plan, awaiting cardiology feedback

## 2015-11-12 NOTE — Progress Notes (Signed)
*  PRELIMINARY RESULTS* Echocardiogram 2D Echocardiogram has been performed.  Meghan Jimenez, Meghan Jimenez 11/12/2015, 12:40 PM

## 2015-11-12 NOTE — Progress Notes (Signed)
Md called and talked to pt on phone, diet ordered, [facility], gait steady, pain is easing, sat out of bed, awaiting food, VSS , sats 96 % on RA.

## 2015-11-12 NOTE — Progress Notes (Signed)
Myoview negative, troponins negative. Still has chest pain. Known narcotic seeking behaviour from prior outpatient notes. Also has chronic pain syndrome Appears comfortable, vitals are stable. Will monitor on telemetry. Use toradol and motrin only for pain. NO NARCOTICS. Ct chest ordered to r/o PE. Monitor on floor. Discharge likely tomorrow.

## 2015-11-12 NOTE — Progress Notes (Signed)
Pt states pain is better and now 5/10 and she wants to go home and rest, Boyfriend is here and is willing to take her and in agreement with plan , Ivs removed, belongings returned, VSS , discharged home

## 2015-11-12 NOTE — Progress Notes (Addendum)
[facility] [place] [facility]   PATIENT NAME: Meghan BurkittSherry Jimenez    MR#:  [id number]  DATE OF BIRTH:  Mar 24, 1961  SUBJECTIVE:  CHIEF COMPLAINT:   Chief Complaint  Patient presents with  . Chest Pain   - Was resting comfortably, when aroused, complains of 10/10 chest pain - troponins negative x 3, vitals stable - on heparin and nitro drips this morning  REVIEW OF SYSTEMS:  Review of Systems  Constitutional: Negative for fever, chills and malaise/fatigue.  HENT: Negative for ear discharge, ear pain and nosebleeds.   Eyes: Negative for blurred vision and double vision.  Respiratory: Negative for cough, shortness of breath and wheezing.   Cardiovascular: Positive for chest pain. Negative for palpitations and leg swelling.  Gastrointestinal: Positive for nausea. Negative for vomiting, abdominal pain, diarrhea and constipation.  Genitourinary: Negative for dysuria and urgency.  Musculoskeletal: Positive for myalgias, back pain and joint pain.  Neurological: Positive for weakness. Negative for dizziness, sensory change, speech change, focal weakness, seizures and headaches.  Psychiatric/Behavioral: Positive for depression.    DRUG ALLERGIES:   Allergies  Allergen Reactions  . Aspirin Anaphylaxis and Palpitations  . Tramadol Anaphylaxis  . Latex   . Vicodin [Hydrocodone-Acetaminophen] Palpitations    VITALS:  Blood pressure 133/64, pulse 73, temperature 97.2 F (36.2 C), temperature source Oral, resp. rate 14, height 5\' 4"  (1.626 m), weight 87 kg (191 lb 12.8 oz), SpO2 95 %.  PHYSICAL EXAMINATION:  Physical Exam  GENERAL:  55 y.o.-year-old patient lying in the bed with no acute distress.  EYES: Pupils equal, round, reactive to light and accommodation. No scleral icterus. Extraocular muscles intact.  HEENT: Head atraumatic, normocephalic. Oropharynx and nasopharynx clear.  NECK:  Supple, no jugular venous distention. No thyroid enlargement, no  tenderness.  LUNGS: Normal breath sounds bilaterally, no wheezing, rales,rhonchi or crepitation. No use of accessory muscles of respiration.  CARDIOVASCULAR: S1, S2 normal. No murmurs, rubs, or gallops. Left chest wall tenderness noted. ABDOMEN: Soft, nontender, nondistended. Bowel sounds present. No organomegaly or mass.  EXTREMITIES: No pedal edema, cyanosis, or clubbing.  NEUROLOGIC: Cranial nerves II through XII are intact. Muscle strength 5/5 in all extremities. Sensation intact. Gait not checked.  PSYCHIATRIC: The patient is alert and oriented x 3. No active depression signs SKIN: No obvious rash, lesion, or ulcer.    LABORATORY PANEL:   CBC  Recent Labs Lab 11/12/15 0014  WBC 7.5  HGB 13.1  HCT 38.0  PLT 98*   ------------------------------------------------------------------------------------------------------------------  Chemistries   Recent Labs Lab 11/11/15 1702 11/12/15 0014  NA 135 136  K 3.9 3.9  CL 105 106  CO2 24 26  GLUCOSE 74 122*  BUN 14 14  CREATININE 0.75 0.60  CALCIUM 8.9 8.4*  AST 120*  --   ALT 91*  --   ALKPHOS 198*  --   BILITOT 0.8  --    ------------------------------------------------------------------------------------------------------------------  Cardiac Enzymes  Recent Labs Lab 11/12/15 0631  TROPONINI <0.03   ------------------------------------------------------------------------------------------------------------------  RADIOLOGY:  No results found.  EKG:   Orders placed or performed during the hospital encounter of 11/11/15  . ED EKG  . ED EKG    ASSESSMENT AND PLAN:   55 year old female with past medical history significant for GERD, chronic pain syndrome, hypertension, COPD, and ongoing smoking, CAD presents to the hospital secondary to intractable chest pain.  #1 chest pain-patient has chronic angina. Please refer to outpatient cardiology notes from September 2015. Questionable drug-seeking  behavior. -Cardiac catheterization  done for severe chest pain showing only moderate LAD disease so medical management was opted. In spite of which patient was complaining of chest pain, so a cardiac MRI with exercise stress test was done in 2016 which did not show any ischemia. -Troponins are negative 3. -Chest pain is reproducible. Discontinue heparin and nitroglycerin drips. -Cardiology consult pending. Exercise stress test ordered. -If test is negative, will discharge home on Motrin as needed. No narcotics will be prescribed at discharge. -Continue other cardiac medications. Patient allergic to aspirin. Continue Plavix  #2 diabetes mellitus-on sliding scale insulin  #3 anxiety and depression-continue outpatient medications  #4 liver cirrhosis-monitor as outpatient.  #5 DVT prophylaxis-started on Lovenox as heparin drip is being discontinued   Possible discharge today if stress test is negative  All the records are reviewed and case discussed with Care Management/Social Workerr. Management plans discussed with the patient, family and they are in agreement.  CODE STATUS: Full code  TOTAL TIME TAKING CARE OF THIS PATIENT: 37 minutes.   POSSIBLE D/C today or tomorrow, DEPENDING ON CLINICAL CONDITION.   Enid Baas M.D on 11/12/2015 at 8:22 AM  Between 7am to 6pm - Pager - [phone]  After 6pm go to [email or web address] - password [email or web address] [facility]  [facility]  Office  [phone]  CC: Primary care physician; [facility]

## 2015-11-12 NOTE — Progress Notes (Signed)
Paged [facility] and spoke with Dr. Sheryle Hailiamond concerning pt's continuous complaints of chest pain.  PRN morphine and oxycodone have been given multiple times as noted [facility] the [facility].  Pt still complains of pain of a 10/10 and pt requested dilaudid.  Pt states that pain level will decrease to an 8/10 but always goes back to 10/10.  Per Dr. Sheryle Hailiamond, pt on protocol and at this time no new orders for pain med were given. Per Dr. Sheryle Hailiamond, get another troponin with the morning labs. Nitroglycerin drip now at 7910mcg/min due to orders to keep systolic >100.  Pt remains very fidgety and will nap off and on but not able to restfully sleep.  Vital signs are stable.  Will continue to monitor.

## 2015-11-12 NOTE — Progress Notes (Addendum)
ANTICOAGULATION CONSULT NOTE - Initial Consult  Pharmacy Consult for heparin drip Indication: chest pain/ACS  Allergies  Allergen Reactions  . Aspirin Anaphylaxis and Palpitations  . Tramadol Anaphylaxis  . Latex   . Vicodin [Hydrocodone-Acetaminophen] Palpitations   Patient Measurements: Heparin Dosing Weight: 73kg  (actualy weight 85 kg)  Vital Signs: Temp: 98.3 F (36.8 C) (07/13 2120) Temp Source: Oral (07/13 2120) BP: 118/67 mmHg (07/14 0000) Pulse Rate: 75 (07/14 0000)  Labs:  Recent Labs  11/11/15 1702 11/12/15 0009 11/12/15 0014  HGB 14.6  --  13.1  HCT 42.3  --  38.0  PLT 129*  --  98*  APTT 32  --   --   LABPROT 14.8  --   --   INR 1.14  --   --   HEPARINUNFRC  --  0.34  --   CREATININE 0.75  --  0.60  TROPONINI <0.03  --   --     Estimated Creatinine Clearance: 83.8 mL/min (by C-G formula based on Cr of 0.6).   Medical History: Past Medical History  Diagnosis Date  . Diabetes mellitus without complication (HCC)   . Hypertension   . COPD (chronic obstructive pulmonary disease) (HCC)   . CHF (congestive heart failure) (HCC)   . Cirrhosis (HCC)   . Hepatitis C     Assessment: 55 yo female with chest pain. Pharmacy consulted for dosing and monitoring of heparin.   Goal of Therapy:  Heparin level 0.3-0.7 units/ml Monitor platelets by anticoagulation protocol: Yes   Plan:  Give 4000 units bolus x 1 Start heparin infusion at 850 units/hr Check anti-Xa level in 6 hours and daily while on heparin Continue to monitor H&H and platelets   7/14 00:00 heparin level 0.34. Continue current regimen and recheck in 6 hours to confirm.  7/14 06:00 heparin level 0.30. Continue current regimen and recheck with tomorrow AM labs.  Cher NakaiSheema Hallaji, PharmD Clinical Pharmacist 11/12/2015 1:18 AM

## 2015-11-12 NOTE — Consult Note (Signed)
[facility] Cardiology Consultation Note  Patient ID: Meghan Jimenez, MRN: [id number], DOB/AGE: [birth year] 55 y.o. Admit date: 11/11/2015   Date of Consult: 11/12/2015 Primary Physician: [facility] Primary Cardiologist:     Chief Complaint:  Chief Complaint  Patient presents with  . Chest Pain   Reason for Consult: chest pain with known coronary artery disease  HPI: 55 y.o. female with known diabetes without complications essential hypertension makes hyperlipidemia and history of coronary artery disease status post previous stenting without evidence of previous myocardial infarction. The patient does have new onset of significant left chest pain increasing with the pressure on her chest and to the touch. The patient did not have any exacerbation with physical activity although this is waxing and waning requiring admission to the hospital. The patient since has EKG showing normal sinus rhythm with right bundle branch block and does not have any elevation of troponin or acute coronary syndrome at this time. Chest pain is still waxing and waning and not improved with nitroglycerin or heparin. The patient currently is stable hemodynamically  Past Medical History  Diagnosis Date  . Diabetes mellitus without complication (HCC)   . Hypertension   . COPD (chronic obstructive pulmonary disease) (HCC)   . CHF (congestive heart failure) (HCC)   . Cirrhosis (HCC)   . Hepatitis C       Surgical History:  Past Surgical History  Procedure Laterality Date  . Cardiac surgery    . Back surgery       Home Meds: Prior to Admission medications   Medication Sig Start Date End Date Taking? Authorizing Provider  ascorbic acid (VITAMIN C) 1000 MG tablet Take 1,000 mg by mouth daily.   Yes Historical Provider, MD  clopidogrel (PLAVIX) 75 MG tablet Take 75 mg by mouth daily.   Yes Historical Provider, MD  diphenhydrAMINE (BENADRYL) 50 MG capsule Take 50 mg by mouth every 6 (six)  hours as needed.   Yes Historical Provider, MD  docusate sodium (COLACE) 100 MG capsule Take 100 mg by mouth 2 (two) times daily.   Yes Historical Provider, MD  gabapentin (NEURONTIN) 300 MG capsule Take 300 mg by mouth 3 (three) times daily.   Yes Historical Provider, MD  hydrOXYzine (ATARAX/VISTARIL) 10 MG tablet Take 10 mg by mouth 3 (three) times daily as needed.   Yes Historical Provider, MD  venlafaxine XR (EFFEXOR-XR) 150 MG 24 hr capsule Take 150 mg by mouth daily with breakfast.   Yes Historical Provider, MD  ibuprofen (ADVIL,MOTRIN) 200 MG tablet Take 1 tablet (200 mg total) by mouth every 6 (six) hours as needed. X 5 days 11/12/15   Enid Baas, MD    Inpatient Medications:  . antiseptic oral rinse  7 mL Mouth Rinse BID  . clopidogrel  75 mg Oral Daily  . docusate sodium  100 mg Oral BID  . enoxaparin (LOVENOX) injection  40 mg Subcutaneous Q24H  . gabapentin  300 mg Oral TID  . insulin aspart  0-5 Units Subcutaneous QHS  . insulin aspart  0-9 Units Subcutaneous TID WC  . sodium chloride flush  3 mL Intravenous Q12H  . venlafaxine XR  150 mg Oral Q breakfast  . ascorbic acid  1,000 mg Oral Daily      Allergies:  Allergies  Allergen Reactions  . Aspirin Anaphylaxis and Palpitations  . Tramadol Anaphylaxis  . Latex   . Vicodin [Hydrocodone-Acetaminophen] Palpitations    Social History   Social History  .  Marital Status: Widowed    Spouse Name: N/A  . Number of Children: N/A  . Years of Education: N/A   Occupational History  . Not on file.   Social History Main Topics  . Smoking status: Current Every Day Smoker    Types: Cigarettes  . Smokeless tobacco: Never Used  . Alcohol Use: No  . Drug Use: No  . Sexual Activity: No   Other Topics Concern  . Not on file   Social History Narrative     Family History  Problem Relation Age of Onset  . CAD Mother   . Diabetes Mother   . Lung cancer Father      Review of Systems Positive for Chest  pain Negative for: General:  chills, fever, night sweats or weight changes.  Cardiovascular: PND orthopnea syncope dizziness  Dermatological skin lesions rashes Respiratory: Cough congestion Urologic: Frequent urination urination at night and hematuria Abdominal: negative for nausea, vomiting, diarrhea, bright red blood per rectum, melena, or hematemesis Neurologic: negative for visual changes, and/or hearing changes  All other systems reviewed and are otherwise negative except as noted above.  Labs:  Recent Labs  11/11/15 1702 11/12/15 0014 11/12/15 0631  TROPONINI <0.03 <0.03 <0.03   Lab Results  Component Value Date   WBC 7.5 11/12/2015   HGB 13.1 11/12/2015   HCT 38.0 11/12/2015   MCV 81.3 11/12/2015   PLT 98* 11/12/2015    Recent Labs Lab 11/11/15 1702 11/12/15 0014  NA 135 136  K 3.9 3.9  CL 105 106  CO2 24 26  BUN 14 14  CREATININE 0.75 0.60  CALCIUM 8.9 8.4*  PROT 7.6  --   BILITOT 0.8  --   ALKPHOS 198*  --   ALT 91*  --   AST 120*  --   GLUCOSE 74 122*   Lab Results  Component Value Date   CHOL 167 11/12/2015   HDL 32* 11/12/2015   LDLCALC 112* 11/12/2015   TRIG 113 11/12/2015   No results found for: DDIMER  Radiology/Studies:  Dg Chest Port 1 View  11/12/2015  CLINICAL DATA:  Two days of left-sided chest pain ; no report of injury; history of CHF, COPD, coronary artery disease, current smoker. EXAM: PORTABLE CHEST 1 VIEW COMPARISON:  PA and lateral chest x-ray of August 11, 2015 FINDINGS: The lungs are well-expanded. There is no focal infiltrate. There is no pleural effusion. The heart and pulmonary vascularity are normal. The mediastinum is normal in width. The bony thorax is unremarkable. IMPRESSION: There is no active cardiopulmonary disease. Electronically Signed   By: David  [place] M.D.   On: 11/12/2015 08:37    EKG: Normal sinus rhythm with right bundle branch block  Weights: Filed Weights   11/11/15 1742 11/11/15 2120  Weight: 187 lb  (84.823 kg) 191 lb 12.8 oz (87 kg)     Physical Exam: Blood pressure 133/64, pulse 73, temperature 97.2 F (36.2 C), temperature source Oral, resp. rate 14, height  (1.626 m), weight 191 lb 12.8 oz (87 kg), SpO2 95 %. Body mass index is 32.91 kg/(m^2). General: Well developed, well nourished, in no acute distress. Head eyes ears nose throat: Normocephalic, atraumatic, sclera non-icteric, no xanthomas, nares are without discharge. No apparent thyromegaly and/or mass  Lungs: Normal respiratory effort.  no wheezes, no rales, no rhonchi.  Heart: RRR with normal S1 S2. no murmur gallop, no rub, PMI is normal size and placement, carotid upstroke normal without bruit, jugular venous pressure is  normal Abdomen: Soft, non-tender, non-distended with normoactive bowel sounds. No hepatomegaly. No rebound/guarding. No obvious abdominal masses. Abdominal aorta is normal size without bruit Extremities: No edema. no cyanosis, no clubbing, no ulcers  Peripheral : 2+ bilateral upper extremity pulses, 2+ bilateral femoral pulses, 2+ bilateral dorsal pedal pulse Neuro: Alert and oriented. No facial asymmetry. No focal deficit. Moves all extremities spontaneously. Musculoskeletal: Normal muscle tone without kyphosis Psych:  Responds to questions appropriately with a normal affect.    Assessment: 55 year old female with coronary artery disease status post previous stenting diabetes without complications congestive heart failure history with an abnormal EKG and atypical chest pain without current evidence of myocardial infarction  Plan: 1. Begin ambulation and follow for worsening chest pain 2. Consider treadmill stress test to assess further myocardial ischemia and risk stratification of current issues listed above 3. Risk reduction cardiovascular event with high intensity cholesterol therapy 4. Hypertension control as necessary 5. Further diagnostic testing and treatment options after  above  Signed, Lamar BlinksKOWALSKI,Lillieann Pavlich J M.D. [facility] 11/12/2015, 9:04 AM

## 2015-11-12 NOTE — Progress Notes (Signed)
IV heparin discontinued, IV nitro discontinued and lines flushed, pt to go to Nuclear med for stress test

## 2015-11-13 NOTE — Discharge Summary (Signed)
[facility] [place] [facility]   PATIENT NAME: Meghan Jimenez    MR#:  [id number]  DATE OF BIRTH:  1960/05/06  DATE OF ADMISSION:  11/11/2015 ADMITTING PHYSICIAN: Alford Highland, MD  DATE OF DISCHARGE: 11/12/2015  6:01 PM  PRIMARY CARE PHYSICIAN: [facility]    ADMISSION DIAGNOSIS:  Unstable angina (HCC) [I20.0] Precordial chest pain [R07.2]  DISCHARGE DIAGNOSIS:  Active Problems:   Unstable angina (HCC)   SECONDARY DIAGNOSIS:   Past Medical History  Diagnosis Date  . Diabetes mellitus without complication (HCC)   . Hypertension   . COPD (chronic obstructive pulmonary disease) (HCC)   . CHF (congestive heart failure) (HCC)   . Cirrhosis (HCC)   . Hepatitis C     HOSPITAL COURSE:   55 year old female with past medical history significant for GERD, chronic pain syndrome, hypertension, COPD, and ongoing smoking, CAD presents to the hospital secondary to intractable chest pain.  #1 chest pain- musculoskeletal chest pain. patient has chronic angina and chronic pain syndrome and narcotic seeking behaviour. -Please refer to outpatient cardiology notes from September 2015. Questionable drug-seeking behavior. -Cardiac catheterization done in 2016 for severe chest pain showing only moderate LAD disease so medical management was opted. In spite of which patient was complaining of chest pain, so a cardiac MRI with exercise stress test was done in 2016 which did not show any ischemia. -Troponins are negative 3. -Chest pain is reproducible. Discontinued heparin and nitroglycerin drips. -Cardiology consult appreciated. Myoview negative. -Simvastatin complaining of intense chest pain. Agree to keep her overnight to do a CT chest to rule out PE. Chest x-ray was normal. However when patient was told that she will not be given any more narcotics since her stress test was negative, she felt that her chest pain was getting better. She was  ambulating fine without any distress. She has decided to go home and  be discharged. No narcotics will be prescribed at discharge. -Continue other cardiac medications. Patient allergic to aspirin. Continue Plavix  #2 diabetes mellitus- diet controlled  #3 anxiety and depression-continue outpatient medications  #4 liver cirrhosis-monitor as outpatient.  Patient being discharged home.  DISCHARGE CONDITIONS:   Guarded  CONSULTS OBTAINED:    Cardiology consult by Dr. Gwen Pounds  DRUG ALLERGIES:   Allergies  Allergen Reactions  . Aspirin Anaphylaxis and Palpitations    Patient takes ibuprofen as home med, so no problem with NSAIDs  . Tramadol Anaphylaxis  . Latex   . Vicodin [Hydrocodone-Acetaminophen] Palpitations    DISCHARGE MEDICATIONS:   Discharge Medication List as of 11/12/2015  5:46 PM    CONTINUE these medications which have CHANGED   Details  ibuprofen (ADVIL,MOTRIN) 200 MG tablet Take 1 tablet (200 mg total) by mouth every 6 (six) hours as needed. X 5 days, Starting 11/12/2015, Until Discontinued, Normal      CONTINUE these medications which have NOT CHANGED   Details  ascorbic acid (VITAMIN C) 1000 MG tablet Take 1,000 mg by mouth daily., Until Discontinued, Historical Med    clopidogrel (PLAVIX) 75 MG tablet Take 75 mg by mouth daily., Until Discontinued, Historical Med    diphenhydrAMINE (BENADRYL) 50 MG capsule Take 50 mg by mouth every 6 (six) hours as needed., Until Discontinued, Historical Med    docusate sodium (COLACE) 100 MG capsule Take 100 mg by mouth 2 (two) times daily., Until Discontinued, Historical Med    gabapentin (NEURONTIN) 300 MG capsule Take 300 mg by mouth 3 (three) times daily.,  Until Discontinued, Historical Med    hydrOXYzine (ATARAX/VISTARIL) 10 MG tablet Take 10 mg by mouth 3 (three) times daily as needed., Until Discontinued, Historical Med    venlafaxine XR (EFFEXOR-XR) 150 MG 24 hr capsule Take 150 mg by mouth daily with  breakfast., Until Discontinued, Historical Med         DISCHARGE INSTRUCTIONS:   1.   If you experience worsening of your admission symptoms, develop shortness of breath, life threatening emergency, suicidal or homicidal thoughts you must seek medical attention immediately by calling 911 or calling your MD immediately  if symptoms less severe.  You Must read complete instructions/literature along with all the possible adverse reactions/side effects for all the Medicines you take and that have been prescribed to you. Take any new Medicines after you have completely understood and accept all the possible adverse reactions/side effects.   Please note  You were cared for by a hospitalist during your hospital stay. If you have any questions about your discharge medications or the care you received while you were in the hospital after you are discharged, you can call the unit and asked to speak with the hospitalist on call if the hospitalist that took care of you is not available. Once you are discharged, your primary care physician will handle any further medical issues. Please note that NO REFILLS for any discharge medications will be authorized once you are discharged, as it is imperative that you return to your primary care physician (or establish a relationship with a primary care physician if you do not have one) for your aftercare needs so that they can reassess your need for medications and monitor your lab values.    Today   CHIEF COMPLAINT:   Chief Complaint  Patient presents with  . Chest Pain     VITAL SIGNS:  Blood pressure 127/77, pulse 78, temperature 97.6 F (36.4 C), temperature source Oral, resp. rate 19, height 5\' 4"  (1.626 m), weight 87 kg (191 lb 12.8 oz), SpO2 97 %.  I/O:   Intake/Output Summary (Last 24 hours) at 11/13/15 1412 Last data filed at 11/12/15 1500  Gross per 24 hour  Intake    240 ml  Output      0 ml  Net    240 ml    PHYSICAL EXAMINATION:    Physical Exam  GENERAL: 55 y.o.-year-old patient lying in the bed with no acute distress.  EYES: Pupils equal, round, reactive to light and accommodation. No scleral icterus. Extraocular muscles intact.  HEENT: Head atraumatic, normocephalic. Oropharynx and nasopharynx clear.  NECK: Supple, no jugular venous distention. No thyroid enlargement, no tenderness.  LUNGS: Normal breath sounds bilaterally, no wheezing, rales,rhonchi or crepitation. No use of accessory muscles of respiration.  CARDIOVASCULAR: S1, S2 normal. No murmurs, rubs, or gallops. Left chest wall tenderness noted. ABDOMEN: Soft, nontender, nondistended. Bowel sounds present. No organomegaly or mass.  EXTREMITIES: No pedal edema, cyanosis, or clubbing.  NEUROLOGIC: Cranial nerves II through XII are intact. Muscle strength 5/5 in all extremities. Sensation intact. Gait not checked.  PSYCHIATRIC: The patient is alert and oriented x 3. No active depression signs SKIN: No obvious rash, lesion, or ulcer.   DATA REVIEW:   CBC  Recent Labs Lab 11/12/15 0014  WBC 7.5  HGB 13.1  HCT 38.0  PLT 98*    Chemistries   Recent Labs Lab 11/11/15 1702 11/12/15 0014  NA 135 136  K 3.9 3.9  CL 105 106  CO2 24 26  GLUCOSE 74 122*  BUN 14 14  CREATININE 0.75 0.60  CALCIUM 8.9 8.4*  AST 120*  --   ALT 91*  --   ALKPHOS 198*  --   BILITOT 0.8  --     Cardiac Enzymes  Recent Labs Lab 11/12/15 0631  TROPONINI <0.03    Microbiology Results  Results for orders placed or performed during the hospital encounter of 11/11/15  MRSA PCR Screening     Status: None   Collection Time: 11/11/15  9:20 PM  Result Value Ref Range Status   MRSA by PCR NEGATIVE NEGATIVE Final    Comment:        The GeneXpert MRSA Assay (FDA approved for NASAL specimens only), is one component of a comprehensive MRSA colonization surveillance program. It is not intended to diagnose MRSA infection nor to guide or monitor treatment  for MRSA infections.     RADIOLOGY:  Nm Myocar Multi W/spect W/wall Motion / Ef  11/12/2015   The study is normal.  This is a low risk study.  The left ventricular ejection fraction is normal (55-65%).  There was no ST segment deviation noted during stress.    Dg Chest Port 1 View  11/12/2015  CLINICAL DATA:  Two days of left-sided chest pain ; no report of injury; history of CHF, COPD, coronary artery disease, current smoker. EXAM: PORTABLE CHEST 1 VIEW COMPARISON:  PA and lateral chest x-ray of August 11, 2015 FINDINGS: The lungs are well-expanded. There is no focal infiltrate. There is no pleural effusion. The heart and pulmonary vascularity are normal. The mediastinum is normal in width. The bony thorax is unremarkable. IMPRESSION: There is no active cardiopulmonary disease. Electronically Signed   By: David  [place]azilandJordan M.D.   On: 11/12/2015 08:37    EKG:   Orders placed or performed during the hospital encounter of 11/11/15  . ED EKG  . ED EKG      Management plans discussed with the patient, family and they are in agreement.  CODE STATUS:  Code Status History    Date Active Date Inactive Code Status Order ID Comments User Context   11/11/2015  6:38 PM 11/12/2015  9:01 PM Full Code [id number]  Alford Highlandichard Wieting, MD ED      TOTAL TIME TAKING CARE OF THIS PATIENT: 37 minutes.    Enid BaasKALISETTI,Deyra Perdomo M.D on 11/13/2015 at 2:12 PM  Between 7am to 6pm - Pager - [phone]  After 6pm go to [email or web address] - password [email or web address] [facility]  [facility]  Office  [phone]  CC: Primary care physician; [facility]

## 2015-12-10 ENCOUNTER — Other Ambulatory Visit: Payer: Self-pay

## 2015-12-10 ENCOUNTER — Emergency Department: Payer: Medicare HMO

## 2015-12-10 ENCOUNTER — Inpatient Hospital Stay
Admission: EM | Admit: 2015-12-10 | Discharge: 2015-12-14 | DRG: 378 | Disposition: A | Discharge status: Home / Self Care | Payer: Medicare HMO | Attending: Internal Medicine | Admitting: Internal Medicine

## 2015-12-10 ENCOUNTER — Encounter: Payer: Self-pay | Admitting: Emergency Medicine

## 2015-12-10 DIAGNOSIS — B1920 Unspecified viral hepatitis C without hepatic coma: Secondary | ICD-10-CM | POA: Diagnosis present

## 2015-12-10 DIAGNOSIS — D62 Acute posthemorrhagic anemia: Secondary | ICD-10-CM | POA: Diagnosis present

## 2015-12-10 DIAGNOSIS — I2510 Atherosclerotic heart disease of native coronary artery without angina pectoris: Secondary | ICD-10-CM | POA: Diagnosis present

## 2015-12-10 DIAGNOSIS — F341 Dysthymic disorder: Secondary | ICD-10-CM | POA: Diagnosis present

## 2015-12-10 DIAGNOSIS — F418 Other specified anxiety disorders: Secondary | ICD-10-CM | POA: Diagnosis present

## 2015-12-10 DIAGNOSIS — R0789 Other chest pain: Secondary | ICD-10-CM | POA: Diagnosis present

## 2015-12-10 DIAGNOSIS — F17210 Nicotine dependence, cigarettes, uncomplicated: Secondary | ICD-10-CM | POA: Diagnosis present

## 2015-12-10 DIAGNOSIS — Z886 Allergy status to analgesic agent status: Secondary | ICD-10-CM

## 2015-12-10 DIAGNOSIS — J209 Acute bronchitis, unspecified: Secondary | ICD-10-CM | POA: Diagnosis present

## 2015-12-10 DIAGNOSIS — K3189 Other diseases of stomach and duodenum: Secondary | ICD-10-CM | POA: Diagnosis present

## 2015-12-10 DIAGNOSIS — Z955 Presence of coronary angioplasty implant and graft: Secondary | ICD-10-CM

## 2015-12-10 DIAGNOSIS — R410 Disorientation, unspecified: Secondary | ICD-10-CM | POA: Diagnosis present

## 2015-12-10 DIAGNOSIS — K766 Portal hypertension: Secondary | ICD-10-CM | POA: Diagnosis present

## 2015-12-10 DIAGNOSIS — K922 Gastrointestinal hemorrhage, unspecified: Principal | ICD-10-CM

## 2015-12-10 DIAGNOSIS — F4310 Post-traumatic stress disorder, unspecified: Secondary | ICD-10-CM

## 2015-12-10 DIAGNOSIS — K7460 Unspecified cirrhosis of liver: Secondary | ICD-10-CM | POA: Diagnosis present

## 2015-12-10 DIAGNOSIS — Z91040 Latex allergy status: Secondary | ICD-10-CM

## 2015-12-10 DIAGNOSIS — Z79899 Other long term (current) drug therapy: Secondary | ICD-10-CM | POA: Diagnosis not present

## 2015-12-10 DIAGNOSIS — R0602 Shortness of breath: Secondary | ICD-10-CM

## 2015-12-10 DIAGNOSIS — K920 Hematemesis: Secondary | ICD-10-CM | POA: Diagnosis present

## 2015-12-10 DIAGNOSIS — Z7902 Long term (current) use of antithrombotics/antiplatelets: Secondary | ICD-10-CM | POA: Diagnosis not present

## 2015-12-10 DIAGNOSIS — J440 Chronic obstructive pulmonary disease with acute lower respiratory infection: Secondary | ICD-10-CM | POA: Diagnosis present

## 2015-12-10 DIAGNOSIS — Z888 Allergy status to other drugs, medicaments and biological substances status: Secondary | ICD-10-CM

## 2015-12-10 DIAGNOSIS — R0600 Dyspnea, unspecified: Secondary | ICD-10-CM

## 2015-12-10 DIAGNOSIS — E119 Type 2 diabetes mellitus without complications: Secondary | ICD-10-CM | POA: Diagnosis present

## 2015-12-10 DIAGNOSIS — I8500 Esophageal varices without bleeding: Secondary | ICD-10-CM | POA: Diagnosis present

## 2015-12-10 DIAGNOSIS — R109 Unspecified abdominal pain: Secondary | ICD-10-CM

## 2015-12-10 LAB — URINALYSIS COMPLETE WITH MICROSCOPIC (ARMC ONLY)
BACTERIA UA: NONE SEEN
Bacteria, UA: NONE SEEN
Bilirubin Urine: NEGATIVE
Bilirubin Urine: NEGATIVE
Glucose, UA: 50 mg/dL — AB
Glucose, UA: NEGATIVE mg/dL
KETONES UR: NEGATIVE mg/dL
Leukocytes, UA: NEGATIVE
NITRITE: NEGATIVE
Nitrite: NEGATIVE
PROTEIN: NEGATIVE mg/dL
PROTEIN: NEGATIVE mg/dL
SPECIFIC GRAVITY, URINE: 1.018 (ref 1.005–1.030)
Specific Gravity, Urine: 1.014 (ref 1.005–1.030)
pH: 6 (ref 5.0–8.0)
pH: 6 (ref 5.0–8.0)

## 2015-12-10 LAB — COMPREHENSIVE METABOLIC PANEL
ALBUMIN: 3 g/dL — AB (ref 3.5–5.0)
ALK PHOS: 129 U/L — AB (ref 38–126)
ALT: 60 U/L — AB (ref 14–54)
AST: 78 U/L — AB (ref 15–41)
Anion gap: 8 (ref 5–15)
BUN: 29 mg/dL — AB (ref 6–20)
CALCIUM: 8.4 mg/dL — AB (ref 8.9–10.3)
CO2: 18 mmol/L — AB (ref 22–32)
CREATININE: 0.57 mg/dL (ref 0.44–1.00)
Chloride: 111 mmol/L (ref 101–111)
GFR calc Af Amer: >60 mL/min (ref >60)
GFR calc non Af Amer: >60 mL/min (ref >60)
GLUCOSE: 212 mg/dL — AB (ref 65–99)
Potassium: 4.1 mmol/L (ref 3.5–5.1)
SODIUM: 137 mmol/L (ref 135–145)
Total Bilirubin: 1.1 mg/dL (ref 0.3–1.2)
Total Protein: 6.7 g/dL (ref 6.5–8.1)

## 2015-12-10 LAB — LIPASE, BLOOD: Lipase: 21 U/L (ref 11–51)

## 2015-12-10 LAB — TYPE AND SCREEN
ABO/RH(D): O POS
Antibody Screen: NEGATIVE

## 2015-12-10 LAB — URINE DRUG SCREEN, QUALITATIVE (ARMC ONLY)
AMPHETAMINES, UR SCREEN: NOT DETECTED
BENZODIAZEPINE, UR SCRN: NOT DETECTED
Barbiturates, Ur Screen: NOT DETECTED
Cannabinoid 50 Ng, Ur ~~LOC~~: NOT DETECTED
Cocaine Metabolite,Ur ~~LOC~~: NOT DETECTED
MDMA (ECSTASY) UR SCREEN: NOT DETECTED
Methadone Scn, Ur: NOT DETECTED
Opiate, Ur Screen: NOT DETECTED
PHENCYCLIDINE (PCP) UR S: NOT DETECTED
TRICYCLIC, UR SCREEN: NOT DETECTED

## 2015-12-10 LAB — CBC
HCT: 35 % (ref 35.0–47.0)
HEMOGLOBIN: 11.7 g/dL — AB (ref 12.0–16.0)
MCH: 28 pg (ref 26.0–34.0)
MCHC: 33.4 g/dL (ref 32.0–36.0)
MCV: 83.7 fL (ref 80.0–100.0)
Platelets: 155 10*3/uL (ref 150–440)
RBC: 4.18 MIL/uL (ref 3.80–5.20)
RDW: 16.4 % — ABNORMAL HIGH (ref 11.5–14.5)
WBC: 12.6 10*3/uL — ABNORMAL HIGH (ref 3.6–11.0)

## 2015-12-10 LAB — TROPONIN I: Troponin I: 0.03 ng/mL (ref <0.03)

## 2015-12-10 LAB — PROTIME-INR
INR: 1.34
PROTHROMBIN TIME: 16.7 s — AB (ref 11.4–15.2)

## 2015-12-10 LAB — GLUCOSE, CAPILLARY
GLUCOSE-CAPILLARY: 157 mg/dL — AB (ref 65–99)
GLUCOSE-CAPILLARY: 116 mg/dL — AB (ref 65–99)

## 2015-12-10 LAB — APTT: aPTT: 32 seconds (ref 24–36)

## 2015-12-10 LAB — MRSA PCR SCREENING: MRSA by PCR: NEGATIVE

## 2015-12-10 LAB — LACTIC ACID, PLASMA
LACTIC ACID, VENOUS: 2.6 mmol/L — AB (ref 0.5–1.9)
LACTIC ACID, VENOUS: 2 mmol/L — AB (ref 0.5–1.9)

## 2015-12-10 MED ORDER — OCTREOTIDE LOAD VIA INFUSION
25.0000 ug | Freq: Once | INTRAVENOUS | Status: AC
  Administered 2015-12-10: 25 ug via INTRAVENOUS
  Filled 2015-12-10: qty 13

## 2015-12-10 MED ORDER — ONDANSETRON HCL 4 MG/2ML IJ SOLN
4.0000 mg | Freq: Once | INTRAMUSCULAR | Status: AC
  Administered 2015-12-10: 4 mg via INTRAVENOUS
  Filled 2015-12-10: qty 2

## 2015-12-10 MED ORDER — SODIUM CHLORIDE 0.9 % IV SOLN
80.0000 mg | Freq: Once | INTRAVENOUS | Status: AC
  Administered 2015-12-10: 80 mg via INTRAVENOUS
  Filled 2015-12-10: qty 80

## 2015-12-10 MED ORDER — SODIUM CHLORIDE 0.9 % IV SOLN
50.0000 ug/h | INTRAVENOUS | Status: DC
  Administered 2015-12-10 – 2015-12-11 (×4): 50 ug/h via INTRAVENOUS
  Filled 2015-12-10 (×10): qty 1

## 2015-12-10 MED ORDER — SODIUM CHLORIDE 0.9 % IV SOLN: 250.0000 mL | INTRAVENOUS | Status: DC | PRN

## 2015-12-10 MED ORDER — VENLAFAXINE HCL ER 75 MG PO CP24
150.0000 mg | ORAL_CAPSULE | Freq: Every day | ORAL | Status: DC
  Administered 2015-12-12 – 2015-12-14 (×3): 150 mg via ORAL
  Filled 2015-12-10: qty 1
  Filled 2015-12-10 (×2): qty 2

## 2015-12-10 MED ORDER — SODIUM CHLORIDE 0.9 % IV SOLN
250.0000 mL | INTRAVENOUS | Status: DC | PRN
  Administered 2015-12-12: 10:00:00 250 mL via INTRAVENOUS

## 2015-12-10 MED ORDER — PANTOPRAZOLE SODIUM 40 MG IV SOLR: 40.0000 mg | Freq: Two times a day (BID) | INTRAVENOUS | Status: DC

## 2015-12-10 MED ORDER — DEXTROSE 5 % IV SOLN
1.0000 g | Freq: Every day | INTRAVENOUS | Status: DC
  Administered 2015-12-10 – 2015-12-14 (×5): 1 g via INTRAVENOUS
  Filled 2015-12-10 (×5): qty 10

## 2015-12-10 MED ORDER — SODIUM CHLORIDE 0.9% FLUSH
3.0000 mL | Freq: Two times a day (BID) | INTRAVENOUS | Status: DC
  Administered 2015-12-11 – 2015-12-14 (×6): 3 mL via INTRAVENOUS

## 2015-12-10 MED ORDER — SENNOSIDES-DOCUSATE SODIUM 8.6-50 MG PO TABS: 2.0000 | ORAL_TABLET | Freq: Every evening | ORAL | Status: DC | PRN

## 2015-12-10 MED ORDER — SODIUM CHLORIDE 0.9 % IV BOLUS (SEPSIS)
1000.0000 mL | Freq: Once | INTRAVENOUS | Status: AC
  Administered 2015-12-10: 1000 mL via INTRAVENOUS

## 2015-12-10 MED ORDER — NICOTINE 21 MG/24HR TD PT24
21.0000 mg | MEDICATED_PATCH | Freq: Every day | TRANSDERMAL | Status: DC
  Administered 2015-12-10 – 2015-12-13 (×4): 21 mg via TRANSDERMAL
  Filled 2015-12-10 (×5): qty 1

## 2015-12-10 MED ORDER — SODIUM CHLORIDE 0.9 % IV SOLN
8.0000 mg/h | INTRAVENOUS | Status: DC
  Administered 2015-12-10: 8 mg/h via INTRAVENOUS
  Filled 2015-12-10: qty 80

## 2015-12-10 MED ORDER — SODIUM CHLORIDE 0.9% FLUSH: 3.0000 mL | INTRAVENOUS | Status: DC | PRN

## 2015-12-10 MED ORDER — GABAPENTIN 300 MG PO CAPS
300.0000 mg | ORAL_CAPSULE | Freq: Three times a day (TID) | ORAL | Status: DC
  Administered 2015-12-10 – 2015-12-14 (×10): 300 mg via ORAL
  Filled 2015-12-10 (×10): qty 1

## 2015-12-10 MED ORDER — LIDOCAINE 5 % EX PTCH
1.0000 | MEDICATED_PATCH | Freq: Once | CUTANEOUS | Status: AC
  Administered 2015-12-10: 1 via TRANSDERMAL
  Filled 2015-12-10: qty 1

## 2015-12-10 MED ORDER — ONDANSETRON HCL 4 MG/2ML IJ SOLN: 4.0000 mg | Freq: Four times a day (QID) | INTRAMUSCULAR | Status: DC | PRN

## 2015-12-10 MED ORDER — HYDROXYZINE HCL 10 MG PO TABS
10.0000 mg | ORAL_TABLET | Freq: Three times a day (TID) | ORAL | Status: DC | PRN
  Administered 2015-12-11 – 2015-12-13 (×6): 10 mg via ORAL
  Filled 2015-12-10 (×6): qty 1

## 2015-12-10 MED ORDER — SODIUM CHLORIDE 0.9 % IV SOLN
80.0000 mg | Freq: Two times a day (BID) | INTRAVENOUS | Status: DC
  Administered 2015-12-10 – 2015-12-14 (×8): 80 mg via INTRAVENOUS
  Filled 2015-12-10 (×13): qty 80

## 2015-12-10 MED ORDER — CETYLPYRIDINIUM CHLORIDE 0.05 % MT LIQD
7.0000 mL | Freq: Two times a day (BID) | OROMUCOSAL | Status: DC
  Administered 2015-12-10 – 2015-12-14 (×7): 7 mL via OROMUCOSAL

## 2015-12-10 NOTE — Progress Notes (Signed)
Called E-link regarding patient chronic back pain. He will place orders after reviewing her chart.

## 2015-12-10 NOTE — ED Provider Notes (Signed)
[facility] Emergency Department Provider Note   ____________________________________________   First MD Initiated Contact with Patient 12/10/15 1326     (approximate)  I have reviewed the triage vital signs and the nursing notes.   HISTORY  Chief Complaint Shortness of Breath and Emesis    HPI Meghan Jimenez is a 55 y.o. female with a history of cirrhosis, congestive heart failure, COPD, hepatitis.  The patient presents today reporting that for the last several hours she is been vomiting up blood. She reports she is vomiting so much blood he was getting stuck in her nose mouth and throat making it hard to breathe at one point. She describes it as dark blood. She also has had nausea and began vomiting some last night. She denies having a history of known "varices" but also tells me she doesn't really know for sure.  Notably the patient takes Plavix.   Reports modest pain in the mid upper abdomen, patient points at her epigastrium. Denies diarrhea. Denies black or bloody stool. No fever.  Past Medical History:  Diagnosis Date  . CHF (congestive heart failure) (HCC)   . Cirrhosis (HCC)   . COPD (chronic obstructive pulmonary disease) (HCC)   . Diabetes mellitus without complication (HCC)   . Hepatitis C   . Hypertension     Patient Active Problem List   Diagnosis Date Noted  . GIB (gastrointestinal bleeding) 12/10/2015  . Unstable angina (HCC) 11/11/2015    Past Surgical History:  Procedure Laterality Date  . BACK SURGERY    . CARDIAC SURGERY      Prior to Admission medications   Medication Sig Start Date End Date Taking? Authorizing Provider  ascorbic acid (VITAMIN C) 1000 MG tablet Take 1,000 mg by mouth daily.   Yes Historical Provider, MD  clopidogrel (PLAVIX) 75 MG tablet Take 75 mg by mouth daily.   Yes Historical Provider, MD  diphenhydrAMINE (BENADRYL) 50 MG capsule Take 50 mg by mouth every 6 (six) hours as needed.   Yes  Historical Provider, MD  docusate sodium (COLACE) 100 MG capsule Take 100 mg by mouth 2 (two) times daily.   Yes Historical Provider, MD  gabapentin (NEURONTIN) 300 MG capsule Take 300 mg by mouth 3 (three) times daily.   Yes Historical Provider, MD  hydrOXYzine (ATARAX/VISTARIL) 10 MG tablet Take 10 mg by mouth 3 (three) times daily as needed.   Yes Historical Provider, MD  ibuprofen (ADVIL,MOTRIN) 200 MG tablet Take 1 tablet (200 mg total) by mouth every 6 (six) hours as needed. X 5 days 11/12/15  Yes Enid Baas, MD  venlafaxine XR (EFFEXOR-XR) 150 MG 24 hr capsule Take 150 mg by mouth daily with breakfast.   Yes Historical Provider, MD    Allergies Aspirin; Tramadol; Latex; and Vicodin [hydrocodone-acetaminophen]  Family History  Problem Relation Age of Onset  . CAD Mother   . Diabetes Mother   . Lung cancer Father     Social History Social History  Substance Use Topics  . Smoking status: Current Every Day Smoker    Packs/day: 1.00    Types: Cigarettes  . Smokeless tobacco: Never Used  . Alcohol use No    Review of Systems Constitutional: No fever/chills. She reports feels weak and fatigued. Eyes: No visual changes. ENT: No sore throat. Cardiovascular: Denies chest pain. Respiratory: Denies shortness of breath now but reports she felt short of breath when she called 911 as of all the blood that was in her nose.  Gastrointestinal: No diarrhea.  No constipation. Genitourinary: Negative for dysuria. Musculoskeletal: Negative for back pain. Skin: Negative for rash. Neurological: Negative for headaches, focal weakness or numbness.  10-point ROS otherwise negative.  ____________________________________________   PHYSICAL EXAM:  VITAL SIGNS: ED Triage Vitals  Enc Vitals Group     BP 12/10/15 1322 98/81     Pulse Rate 12/10/15 1322 (!) 124     Resp 12/10/15 1322 (!) 24     Temp 12/10/15 1354 98.3 F (36.8 C)     Temp Source 12/10/15 1354 Oral     SpO2 12/10/15  1322 100 %     Weight 12/10/15 1322 178 lb (80.7 kg)     Height 12/10/15 1322 5\' 4"  (1.626 m)     Head Circumference --      Peak Flow --      Pain Score 12/10/15 1323 9     Pain Loc --      Pain Edu? --      Excl. in GC? --     Constitutional: Alert and oriented. Patient is fatigued, appears ill, reporting some mild nausea. Eyes: Conjunctivae are normal. PERRL. EOMI. Head: Atraumatic. Nose: No congestion/rhinnorhea. Mouth/Throat: Mucous membranes are dry.  Oropharynx non-erythematous. There is notably dry blood around the naris as well as around the mouth but no active emesis. Neck: No stridor.   Cardiovascular: Tachycardic rate, regular rhythm. Grossly normal heart sounds.  Good peripheral circulation. Respiratory: Normal respiratory effort.  No retractions. Lungs CTAB. Gastrointestinal: Soft mildly tender in epigastrium, no rebound or guarding. Negative Murphy. No distention. No CVA tenderness. Musculoskeletal: Mild bilateral lower extremity edema, moves all extremities well Neurologic:  Normal speech and language. No gross focal neurologic deficits are appreciated. Skin:  Skin is slightly cool, minimally diaphoretic,and intact. No rash noted. Psychiatric: Mood and affect are lately anxious. Speech and behavior are normal.  ____________________________________________   LABS (all labs ordered are listed, but only abnormal results are displayed)  Labs Reviewed  CBC - Abnormal; Notable for the following:       Result Value   WBC 12.6 (*)    Hemoglobin 11.7 (*)    RDW 16.4 (*)    All other components within normal limits  COMPREHENSIVE METABOLIC PANEL - Abnormal; Notable for the following:    CO2 18 (*)    Glucose, Bld 212 (*)    BUN 29 (*)    Calcium 8.4 (*)    Albumin 3.0 (*)    AST 78 (*)    ALT 60 (*)    Alkaline Phosphatase 129 (*)    All other components within normal limits  TROPONIN I - Abnormal; Notable for the following:    Troponin I 0.03 (*)    All other  components within normal limits  URINALYSIS COMPLETEWITH MICROSCOPIC ([facility] ONLY) - Abnormal; Notable for the following:    Color, Urine YELLOW (*)    APPearance HAZY (*)    Glucose, UA 50 (*)    Ketones, ur TRACE (*)    Hgb urine dipstick 2+ (*)    Leukocytes, UA 2+ (*)    Squamous Epithelial / LPF 0-5 (*)    All other components within normal limits  PROTIME-INR - Abnormal; Notable for the following:    Prothrombin Time 16.7 (*)    All other components within normal limits  LACTIC ACID, PLASMA - Abnormal; Notable for the following:    Lactic Acid, Venous 2.6 (*)    All other components within normal limits  GLUCOSE, CAPILLARY - Abnormal; Notable for the following:    Glucose-Capillary 157 (*)    All other components within normal limits  MRSA PCR SCREENING  LIPASE, BLOOD  APTT  LACTIC ACID, PLASMA  TYPE AND SCREEN   ____________________________________________  EKG  Reviewed and internal me at 12:15 Ventricular rate 120 PR 1:30 QRS 120 QTC 500 Sinus tachycardia, incomplete right bundle-branch block with associated T-wave inversions as expected in the right bundle. No obvious evidence of ischemic change ____________________________________________  RADIOLOGY  Dg Abdomen 1 View  Result Date: 12/10/2015 CLINICAL DATA:  Upper abdominal pain. Bloody emesis. Screened for obstruction EXAM: ABDOMEN - 1 VIEW COMPARISON:  CT 07/21/2010 FINDINGS: Surgical clips are noted in the right upper quadrant m left upper quadrant. Status post lumbar fusion. Bowel gas pattern is nonobstructive. No evidence for organomegaly. No abnormal calcifications. IMPRESSION: Nonobstructive bowel gas pattern.  Postoperative changes. Electronically Signed   By: Norva Pavlov M.D.   On: 12/10/2015 14:54   Dg Chest Portable 1 View  Result Date: 12/10/2015 CLINICAL DATA:  55 year old female with a history of difficulty breathing EXAM: PORTABLE CHEST 1 VIEW COMPARISON:  11/12/2015, 08/11/2015, CT  05/24/2015 FINDINGS: Cardiomediastinal silhouette unchanged. Surgical changes of the cervical region. Low lung volumes. No confluent airspace disease, pneumothorax, or pleural effusion. No displaced fracture. IMPRESSION: No radiographic evidence of acute cardiopulmonary disease. Signed, Yvone Neu. Loreta Ave, DO [facility] Radiology Electronically Signed   By: Gilmer Mor D.O.   On: 12/10/2015 14:03    ____________________________________________   PROCEDURES  Procedure(s) performed: None  Procedures  Critical Care performed: Yes, see critical care note(s)  CRITICAL CARE Performed by: Sharyn Creamer   Total critical care time: 45 minutes  Critical care time was exclusive of separately billable procedures and treating other patients.  Critical care was necessary to treat or prevent imminent or life-threatening deterioration.  Critical care was time spent personally by me on the following activities: development of treatment plan with patient and/or surrogate as well as nursing, discussions with consultants, evaluation of patient's response to treatment, examination of patient, obtaining history from patient or surrogate, ordering and performing treatments and interventions, ordering and review of laboratory studies, ordering and review of radiographic studies, pulse oximetry and re-evaluation of patient's condition.  ____________________________________________   INITIAL IMPRESSION / ASSESSMENT AND PLAN / ED COURSE  Pertinent labs & imaging results that were available during my care of the patient were reviewed by me and considered in my medical decision making (see chart for details).  Patient presents for evaluation of vomiting up blood. She has a notable history of cirrhosis, but no clear chart notation of varices. She began vomiting last night, and her son brings a picture that clearly shows bloody emesis on the floor. She did feel short  of breath, but I suspect this was due to vomiting up bloody emesis, and she reports the symptoms have improved. She has mild tenderness in epigastrium, and my primary concern is for acute upper GI bleed given the clinical history. She does not appear clinically stable, and shows evidence of hemodynamic compromise and possibly even early hemorrhagic shock with tachycardia, low blood pressure.  Confirm there is no gastroenterology coverage today at [facility] until 6 PM. I discussed the case with Dr. Belia Heman, ICU doctor, who advises given the presentation and the patient should be transferred. Discussed with the patient, she and family are agreeable with the plan to transfer.   ----------------------------------------- 2:28 PM on 12/10/2015 ----------------------------------------- Transfer  call has been [facility] to [facility]. They're awaiting callback for about 25 minutes. Patient reports improvement, no further hematemesis. Blood pressure heart rate improving, appears hemoglobin stable. Awaiting call back to transfer due to lack of GI coverage.  ----------------------------------------- 2:46 PM on 12/10/2015 -----------------------------------------  Discussed with the intensive care unit physician at [facility], via [facility], [facility] advises that as the patient does not have ongoing active hematemesis and vital signs are stabilizing and he recommends against transfer, but does recommend obtaining critical care consult and admitting the patient here and if the patient redemonstrates instability or ongoing large hematemesis he would advise consult for transfer, but based on current transfer time frames and processes and he does not feel the patient would have end[facility] done emergently [facility], and thus the benefits of transfer are likely outweighed by the risks. I have placed a consult to Dr. Belia HemanKasa for a bedside evaluation and further treatment/care recommendations.  Clinical Course    ----------------------------------------- 3:00 PM on 12/10/2015 -----------------------------------------  Case discussed with Dr. Belia HemanKasa, and as well as with the patient. After discussing the benefits, time frames of GI consultation, and risks of transfer with the patient and her family the patient strongly notes that she would prefer to stay at [facility]. She does appear to be stable now. Critical Care team admitting to step-down level at this time.  Family also reports the patient has a history of cocaine use, active.  Patient reports improvement, abdomen soft nontender nondistended at this time.   Elevated WBC and lactic, but no infectious symptoms. Suspect elevated lactic acid secondary to hypotension (now improved).  ____________________________________________   FINAL CLINICAL IMPRESSION(S) / ED DIAGNOSES  Final diagnoses:  Upper GI bleeding      NEW MEDICATIONS STARTED DURING THIS VISIT:  Current Discharge Medication List       Note:  This document was prepared using Dragon voice recognition software and may include unintentional dictation errors.     Sharyn CreamerMark Jlyn Cerros, MD 12/10/15 [phone]

## 2015-12-10 NOTE — Consult Note (Signed)
Pharmacy Antibiotic Note  Meghan Jimenez is a 55 y.o. female admitted on 12/10/2015 with SBP prophylaxis.  Pharmacy has been consulted for ceftriaxone dosing.  Plan: ceftriaxone 1g q 24 hours  Height: 5\' 4"  (162.6 cm) Weight: 178 lb (80.7 kg) IBW/kg (Calculated) : 54.7  Temp (24hrs), Avg:98.3 F (36.8 C), Min:98.3 F (36.8 C), Max:98.3 F (36.8 C)   Recent Labs Lab 12/10/15 1329 12/10/15 1337  WBC 12.6*  --   CREATININE 0.57  --   LATICACIDVEN  --  2.6*    Estimated Creatinine Clearance: 80.7 mL/min (by C-G formula based on SCr of 0.8 mg/dL).    Allergies  Allergen Reactions  . Aspirin Anaphylaxis and Palpitations    Patient takes ibuprofen as home med, so no problem with NSAIDs  . Tramadol Anaphylaxis  . Latex   . Vicodin [Hydrocodone-Acetaminophen] Palpitations    Antimicrobials this admission: ceftriaxone 8/11 >>    Dose adjustments this admission:   Microbiology results:   Thank you for allowing pharmacy to be a part of this patient's care.  Olene FlossMelissa D Hiba Garry, Pharm.D Clinical Pharmacist  12/10/2015 3:01 PM

## 2015-12-10 NOTE — Progress Notes (Signed)
eLink Physician-Brief Progress Note Patient Name: Meghan PrudeSherry N Jimenez DOB: 03-04-1961 MRN: [id number]   Date of Service  12/10/2015  HPI/Events of Note  Patient c/o low mid back pain. Allergy to hydrocodone and Tramadol >> no narcotics. Admit for GI bleed >> no Motrin. AST/ALT elevated >> no Tylenol.   eICU Interventions  Will order: 1. Lidoderm patch to low mid back X 1.     Intervention Category Intermediate Interventions: Pain - evaluation and management  Sommer,Steven Eugene 12/10/2015, 6:12 PM

## 2015-12-10 NOTE — ED Notes (Signed)
Pt currently taking Plavix.

## 2015-12-10 NOTE — ED Notes (Addendum)
Meghan MainlandChristine Jimenez (daughter POA) [phone] Meghan Jimenez (son) [phone]

## 2015-12-10 NOTE — ED Triage Notes (Signed)
Pt to ED via EMS from home c/o breathing difficulty and coffee ground emesis since last night.  Pt reports vomiting x3-4 times, denies diarrhea, black coffee ground like emesis.  Pt states back pain but no more than usual chronic pain.  EMS vitals 138/87, sinus tach 120 HR, CBG 216.  Pt has hx of DBM, hep C, HTN, stent placement.  Pt presents chest rise even and labored, A&Ox4, speaking in complete and coherent sentences, pale and clammy.

## 2015-12-10 NOTE — Progress Notes (Signed)
Patient alert and oriented. Does have some SOB during exertion. She is on room air 99-100%. No active bleeding noted and hemoglobin stable. Clear liquid diet ordered and tolerating. GI consult entered routine. Using bedpan.

## 2015-12-11 ENCOUNTER — Inpatient Hospital Stay: Payer: Medicare HMO | Admitting: Anesthesiology

## 2015-12-11 ENCOUNTER — Encounter
Admission: EM | Disposition: A | Discharge status: Home / Self Care | Payer: Self-pay | Source: Home / Self Care | Attending: Internal Medicine

## 2015-12-11 ENCOUNTER — Ambulatory Visit: Admit: 2015-12-11 | Payer: Self-pay

## 2015-12-11 ENCOUNTER — Inpatient Hospital Stay: Payer: Medicare HMO

## 2015-12-11 HISTORY — PX: ESOPHAGOGASTRODUODENOSCOPY (EGD) WITH PROPOFOL: SHX5813

## 2015-12-11 LAB — GLUCOSE, CAPILLARY
GLUCOSE-CAPILLARY: 104 mg/dL — AB (ref 65–99)
GLUCOSE-CAPILLARY: 130 mg/dL — AB (ref 65–99)
GLUCOSE-CAPILLARY: 150 mg/dL — AB (ref 65–99)
GLUCOSE-CAPILLARY: 203 mg/dL — AB (ref 65–99)
GLUCOSE-CAPILLARY: 90 mg/dL (ref 65–99)
Glucose-Capillary: 135 mg/dL — ABNORMAL HIGH (ref 65–99)
Glucose-Capillary: 148 mg/dL — ABNORMAL HIGH (ref 65–99)
Glucose-Capillary: 166 mg/dL — ABNORMAL HIGH (ref 65–99)

## 2015-12-11 LAB — BASIC METABOLIC PANEL
Anion gap: 7 (ref 5–15)
BUN: 27 mg/dL — AB (ref 6–20)
CALCIUM: 7.9 mg/dL — AB (ref 8.9–10.3)
CO2: 22 mmol/L (ref 22–32)
Chloride: 110 mmol/L (ref 101–111)
Creatinine, Ser: 0.82 mg/dL (ref 0.44–1.00)
GFR calc Af Amer: >60 mL/min (ref >60)
GLUCOSE: 104 mg/dL — AB (ref 65–99)
POTASSIUM: 3.6 mmol/L (ref 3.5–5.1)
SODIUM: 139 mmol/L (ref 135–145)

## 2015-12-11 LAB — CBC
HCT: 29.9 % — ABNORMAL LOW (ref 35.0–47.0)
Hemoglobin: 10.4 g/dL — ABNORMAL LOW (ref 12.0–16.0)
MCH: 28.4 pg (ref 26.0–34.0)
MCHC: 34.7 g/dL (ref 32.0–36.0)
MCV: 81.8 fL (ref 80.0–100.0)
PLATELETS: 152 10*3/uL (ref 150–440)
RBC: 3.66 MIL/uL — ABNORMAL LOW (ref 3.80–5.20)
RDW: 16.1 % — AB (ref 11.5–14.5)
WBC: 11.2 10*3/uL — AB (ref 3.6–11.0)

## 2015-12-11 LAB — BLOOD GAS, ARTERIAL
Acid-base deficit: 1.5 mmol/L (ref 0.0–2.0)
Bicarbonate: 21.1 mEq/L (ref 21.0–28.0)
O2 Saturation: 96.1 %
PCO2 ART: 27 mmHg — AB (ref 32.0–48.0)
PH ART: 7.5 — AB (ref 7.350–7.450)
PO2 ART: 75 mmHg — AB (ref 83.0–108.0)
Patient temperature: 37

## 2015-12-11 LAB — PROTIME-INR
INR: 1.23
PROTHROMBIN TIME: 15.6 s — AB (ref 11.4–15.2)

## 2015-12-11 LAB — PHOSPHORUS: Phosphorus: 3.1 mg/dL (ref 2.5–4.6)

## 2015-12-11 LAB — AMMONIA: Ammonia: 40 umol/L — ABNORMAL HIGH (ref 9–35)

## 2015-12-11 LAB — PROCALCITONIN: PROCALCITONIN: 0.27 ng/mL

## 2015-12-11 LAB — MAGNESIUM: MAGNESIUM: 2.1 mg/dL (ref 1.7–2.4)

## 2015-12-11 SURGERY — ESOPHAGOGASTRODUODENOSCOPY (EGD) WITH PROPOFOL
Anesthesia: General

## 2015-12-11 SURGERY — EGD (ESOPHAGOGASTRODUODENOSCOPY)
Anesthesia: General

## 2015-12-11 MED ORDER — LACTATED RINGERS IV SOLN
INTRAVENOUS | Status: DC | PRN
  Administered 2015-12-11: 12:00:00 via INTRAVENOUS

## 2015-12-11 MED ORDER — SODIUM CHLORIDE 0.9 % IV SOLN: INTRAVENOUS | Status: DC

## 2015-12-11 MED ORDER — IPRATROPIUM-ALBUTEROL 0.5-2.5 (3) MG/3ML IN SOLN
3.0000 mL | Freq: Four times a day (QID) | RESPIRATORY_TRACT | Status: DC
  Administered 2015-12-11 (×4): 3 mL via RESPIRATORY_TRACT
  Filled 2015-12-11 (×4): qty 3

## 2015-12-11 MED ORDER — IPRATROPIUM-ALBUTEROL 0.5-2.5 (3) MG/3ML IN SOLN: 3.0000 mL | Freq: Four times a day (QID) | RESPIRATORY_TRACT | Status: DC | PRN

## 2015-12-11 MED ORDER — PROPOFOL 10 MG/ML IV BOLUS
INTRAVENOUS | Status: DC | PRN
  Administered 2015-12-11: 30 mg via INTRAVENOUS
  Administered 2015-12-11: 70 mg via INTRAVENOUS

## 2015-12-11 MED ORDER — HYDROCODONE-ACETAMINOPHEN 5-325 MG PO TABS
1.0000 | ORAL_TABLET | Freq: Three times a day (TID) | ORAL | Status: DC | PRN
  Administered 2015-12-11 (×2): 1 via ORAL
  Filled 2015-12-11 (×2): qty 1

## 2015-12-11 MED ORDER — PROPOFOL 500 MG/50ML IV EMUL
INTRAVENOUS | Status: DC | PRN
  Administered 2015-12-11: 120 ug/kg/min via INTRAVENOUS

## 2015-12-11 MED ORDER — HYDROCODONE-ACETAMINOPHEN 5-325 MG PO TABS
1.0000 | ORAL_TABLET | ORAL | Status: DC | PRN
Start: 2015-12-11 — End: 2015-12-13
  Administered 2015-12-11 – 2015-12-13 (×6): 1 via ORAL
  Filled 2015-12-11 (×6): qty 1

## 2015-12-11 MED ORDER — INSULIN ASPART 100 UNIT/ML ~~LOC~~ SOLN
0.0000 [IU] | SUBCUTANEOUS | Status: DC
  Administered 2015-12-11: 1 [IU] via SUBCUTANEOUS
  Administered 2015-12-11 – 2015-12-12 (×2): 2 [IU] via SUBCUTANEOUS
  Administered 2015-12-12: 1 [IU] via SUBCUTANEOUS
  Administered 2015-12-12 (×2): 3 [IU] via SUBCUTANEOUS
  Administered 2015-12-13: 2 [IU] via SUBCUTANEOUS
  Administered 2015-12-13: 12:00:00 7 [IU] via SUBCUTANEOUS
  Administered 2015-12-13: 3 [IU] via SUBCUTANEOUS
  Filled 2015-12-11 (×2): qty 2
  Filled 2015-12-11 (×2): qty 3
  Filled 2015-12-11: qty 2
  Filled 2015-12-11: qty 3
  Filled 2015-12-11 (×2): qty 1

## 2015-12-11 NOTE — Op Note (Signed)
[facility] Gastroenterology Patient Name: Casey BurkittSherry Pridgen Procedure Date: 12/11/2015 12:07 PM MRN: [id number] Account #: [id number] Date of Birth: 22-Jun-1960 Admit Type: Inpatient Age: [age] [facility] Gender: Female Note Status: Finalized Procedure:            Upper GI endoscopy Indications:          Recent gastrointestinal bleeding Providers:            Lacey JensenSteven Quientin Jent MD Referring MD:         [facility] Medicines:            See the Anesthesia note for documentation of the                        administered medications, General Anesthesia Complications:        No immediate complications. Procedure:            Pre-Anesthesia Assessment:                       - Prior to the procedure, a History and Physical was                        performed, and patient medications and allergies were                        reviewed. The patient's tolerance of previous                        anesthesia was also reviewed. The risks and benefits of                        the procedure and the sedation options and risks were                        discussed with the patient. All questions were                        answered, and informed consent was obtained. Prior                        Anticoagulants: The patient has taken Plavix                        (clopidogrel), last dose was 2 days prior to procedure.                        ASA Grade Assessment: III - A patient with severe                        systemic disease. After reviewing the risks and                        benefits, the patient was deemed in satisfactory                        condition to undergo the procedure.                       After obtaining informed consent, the endoscope was  passed under direct vision. Throughout the procedure,                        the patient's blood pressure, pulse, and oxygen                        saturations were monitored continuously. The  Endoscope                        was introduced through the mouth, and advanced to the                        second part of duodenum. The upper GI endoscopy was                        accomplished without difficulty. The patient tolerated                        the procedure well. Findings:      One column of non-bleeding grade I varices were found in the lower third       of the esophagus,. They were small in size. No stigmata of recent       bleeding were evident and no red wale signs were present.      Moderate portal hypertensive gastropathy wih mulple petechiae was found       in the entire examined stomach.      A single mino bleeding angioectasia was found in the gastric body and on       the lesser curvature of the stomach. For hemostasis, one hemostatic clip       was successfully placed. There was no bleeding at the end of the       procedure.      Patchy moderately erythematous mucosa without active bleeding and with       no stigmata of bleeding was found in the duodenal bulb. Impression:           - Non-bleeding grade I esophageal varices.                       - Portal hypertensive gastropathy.                       - A single minor bleeding angioectasia in the stomach.                        Clip was placed.                       - Erythematous duodenopathy.                       - No specimens collected. Recommendation:       - Soft diet.                       - Use Protonix (pantoprazole) 40 mg PO BID.                       - Resume Plavix (clopidogrel) at prior dose in 2-3                        days.  Resume HGB remains stable.                       - No aspirin, ibuprofen, naproxen, or other                        non-steroidal anti-inflammatory drugs.                       - Needs follow up with GI in next 2-3 week post                        discharge. Procedure Code(s):    --- Professional ---                       [id number], Esophagogastroduodenoscopy, flexible,  transoral;                        with control of bleeding, any method Diagnosis Code(s):    --- Professional ---                       K92.2, Gastrointestinal hemorrhage, unspecified CPT copyright 2016 American Medical Association. All rights reserved. The codes documented in this report are preliminary and upon coder review may  be revised to meet current compliance requirements. [name], MD [name] MD,  12/11/2015 12:57:51 PM Number of Addenda: 0 Note Initiated On: 12/11/2015 12:07 PM      [facility]

## 2015-12-11 NOTE — Anesthesia Preprocedure Evaluation (Addendum)
Anesthesia Evaluation  Patient identified by MRN, date of birth, ID band Patient awake    Reviewed: Allergy & Precautions, NPO status , Patient's Chart, lab work & pertinent test results  History of Anesthesia Complications Negative for: history of anesthetic complications  Airway Mallampati: II  TM Distance: >3 FB Neck ROM: Full    Dental  (+) Poor Dentition, Missing, Edentulous Upper   Pulmonary COPD,  COPD inhaler, Current Smoker,    breath sounds clear to auscultation- rhonchi (-) wheezing      Cardiovascular hypertension, + CAD, + Cardiac Stents and +CHF   Rhythm:Regular Rate:Normal - Systolic murmurs and - Diastolic murmurs NM stress test 11/12/15:   The study is normal.  This is a low risk study.  The left ventricular ejection fraction is normal (55-65%).  There was no ST segment deviation noted during stress  Echo 11/12/15: - Left ventricle: The cavity size was normal. Systolic function was   normal. The estimated ejection fraction was in the range of 55%   to 60%. - Aortic valve: Valve area (Vmax): 2.36 cm^2. No significant valvular abnormalities    Neuro/Psych negative psych ROS   GI/Hepatic (+) Cirrhosis       , Hepatitis -, CGIB   Endo/Other  diabetes  Renal/GU      Musculoskeletal   Abdominal Normal abdominal exam  (+)   Peds  Hematology negative hematology ROS (+)   Anesthesia Other Findings   Reproductive/Obstetrics                            Anesthesia Physical Anesthesia Plan  ASA: III  Anesthesia Plan: General   Post-op Pain Management:    Induction: Intravenous  Airway Management Planned: Natural Airway  Additional Equipment:   Intra-op Plan:   Post-operative Plan:   Informed Consent: I have reviewed the patients History and Physical, chart, labs and discussed the procedure including the risks, benefits and alternatives for the proposed  anesthesia with the patient or authorized representative who has indicated his/her understanding and acceptance.   Dental advisory given  Plan Discussed with: CRNA and Anesthesiologist  Anesthesia Plan Comments:         Anesthesia Quick Evaluation

## 2015-12-11 NOTE — Transfer of Care (Signed)
Immediate Anesthesia Transfer of Care Note  Patient: Meghan PlentySherry N Mandato  Procedure(s) Performed: Procedure(s): ESOPHAGOGASTRODUODENOSCOPY (EGD) WITH PROPOFOL (N/A)  Patient Location: Endoscopy Unit  Anesthesia Type:General  Level of Consciousness: awake  Airway & Oxygen Therapy: Patient Spontanous Breathing and Patient connected to face mask oxygen  Post-op Assessment: Report given to RN and Post -op Vital signs reviewed and stable  Post vital signs: Reviewed  Last Vitals:  Vitals:   12/11/15 0800 12/11/15 1234  BP: 114/74 (!) 105/47  Pulse: 81 95  Resp: 15 16  Temp:      Last Pain:  Vitals:   12/11/15 0700  TempSrc:   PainSc: 6       Patients Stated Pain Goal:  (not sure) (12/10/15 1544)  Complications: No apparent anesthesia complications

## 2015-12-11 NOTE — Anesthesia Postprocedure Evaluation (Signed)
Anesthesia Post Note  Patient: Amedeo PlentySherry N Godman  Procedure(s) Performed: Procedure(s) (LRB): ESOPHAGOGASTRODUODENOSCOPY (EGD) WITH PROPOFOL (N/A)  Patient location during evaluation: Endoscopy Anesthesia Type: General Level of consciousness: awake and alert Pain management: pain level controlled Vital Signs Assessment: post-procedure vital signs reviewed and stable Respiratory status: spontaneous breathing, nonlabored ventilation and respiratory function stable Cardiovascular status: blood pressure returned to baseline and stable Postop Assessment: no signs of nausea or vomiting Anesthetic complications: no    Last Vitals:  Vitals:   12/11/15 1249 12/11/15 1259  BP: 112/61 111/64  Pulse: 89 89  Resp: 16 17  Temp:      Last Pain:  Vitals:   12/11/15 0700  TempSrc:   PainSc: 6                  Etosha Wetherell

## 2015-12-11 NOTE — Consult Note (Signed)
Consultation  Referring Provider:      Primary Care Physician:  [facility] Primary Gastroenterologist:         Reason for Consultation:      This is a 55 yo Caucasian female with a PMH polysubstance abuse, Hep C, liver cirrhosis, COPD, T2DM, CAD s/p Stent placement on plavix,, and CHF who presented to the ED Via EMS with hematemesis and difficulty breathing. Patient states that she was in her usual state of health when last night she felt really weak and hot and when she got up, she felt nauseas and started vomiting. She vomited about 4 times and emesis was dark red blood with clots. She was found on the floor, confused by her children and hence EMS was called. Upon EMS' arrival, her HR was in the 120s and her blood pressure was normal. Per her daughter, patient had taken off all her clothes and appeared confused when she was found.  No further hematemesis noted since ED presentation. She reports mild diffuse abdominal pain, sob and mild nausea. She is a 3-4 pack/day smoker. Admits to taking NSAIDS weekly.  H/H decreased to 10.4 PLT 152.      Impression / Plan:   Upper GI bleed: Schedule EGD today . Hold Plavix until EGD performed. Continue IV Protonix.   Cirrhosis secondary to Hepatitis C.  Followed by outpatient hepatologist.           HPI:   Meghan Jimenez is a 55 y.o. female   Past Medical History:  Diagnosis Date  . CHF (congestive heart failure) (HCC)   . Cirrhosis (HCC)   . COPD (chronic obstructive pulmonary disease) (HCC)   . Diabetes mellitus without complication (HCC)   . Hepatitis C   . Hypertension     Past Surgical History:  Procedure Laterality Date  . BACK SURGERY    . CARDIAC SURGERY      Family History  Problem Relation Age of Onset  . CAD Mother   . Diabetes Mother   . Lung cancer Father      Social History  Substance Use Topics  . Smoking status: Current Every Day Smoker    Packs/day: 1.00    Types: Cigarettes  . Smokeless  tobacco: Never Used     Comment: SMOKES 3-4 PKS/DAY  . Alcohol use Yes     Comment: Drinks occasionally every week    Prior to Admission medications   Medication Sig Start Date End Date Taking? Authorizing Provider  ascorbic acid (VITAMIN C) 1000 MG tablet Take 1,000 mg by mouth daily.   Yes Historical Provider, MD  clopidogrel (PLAVIX) 75 MG tablet Take 75 mg by mouth daily.   Yes Historical Provider, MD  diphenhydrAMINE (BENADRYL) 50 MG capsule Take 50 mg by mouth every 6 (six) hours as needed.   Yes Historical Provider, MD  docusate sodium (COLACE) 100 MG capsule Take 100 mg by mouth 2 (two) times daily.   Yes Historical Provider, MD  gabapentin (NEURONTIN) 300 MG capsule Take 300 mg by mouth 3 (three) times daily.   Yes Historical Provider, MD  hydrOXYzine (ATARAX/VISTARIL) 10 MG tablet Take 10 mg by mouth 3 (three) times daily as needed.   Yes Historical Provider, MD  ibuprofen (ADVIL,MOTRIN) 200 MG tablet Take 1 tablet (200 mg total) by mouth every 6 (six) hours as needed. X 5 days 11/12/15  Yes Enid Baas, MD  venlafaxine XR (EFFEXOR-XR) 150 MG 24 hr capsule Take 150 mg by mouth daily  with breakfast.   Yes Historical Provider, MD    Current Facility-Administered Medications  Medication Dose Route Frequency Provider Last Rate Last Dose  . 0.9 %  sodium chloride infusion  250 mL Intravenous PRN Erin Fulling, MD      . 0.9 %  sodium chloride infusion  250 mL Intravenous PRN Erin Fulling, MD      . antiseptic oral rinse (CPC / CETYLPYRIDINIUM CHLORIDE 0.05%) solution 7 mL  7 mL Mouth Rinse BID Erin Fulling, MD   7 mL at 12/10/15 2141  . cefTRIAXone (ROCEPHIN) 1 g in dextrose 5 % 50 mL IVPB  1 g Intravenous Daily Olene Floss, RPH   1 g at 12/11/15 1031  . gabapentin (NEURONTIN) capsule 300 mg  300 mg Oral TID Lewie Loron, NP   300 mg at 12/10/15 2143  . HYDROcodone-acetaminophen (NORCO/VICODIN) 5-325 MG per tablet 1 tablet  1 tablet Oral Q8H PRN Lewie Loron, NP   1  tablet at 12/11/15 0418  . hydrOXYzine (ATARAX/VISTARIL) tablet 10 mg  10 mg Oral TID PRN Lewie Loron, NP      . insulin aspart (novoLOG) injection 0-9 Units  0-9 Units Subcutaneous Q4H Magadalene S Tukov, NP      . ipratropium-albuterol (DUONEB) 0.5-2.5 (3) MG/3ML nebulizer solution 3 mL  3 mL Nebulization Q6H Lewie Loron, NP   3 mL at 12/11/15 0801  . nicotine (NICODERM CQ - dosed in mg/24 hours) patch 21 mg  21 mg Transdermal Daily Lewie Loron, NP   21 mg at 12/11/15 1030  . octreotide (SANDOSTATIN) 500 mcg in sodium chloride 0.9 % 250 mL (2 mcg/mL) infusion  50 mcg/hr Intravenous Continuous Sharyn Creamer, MD 25 mL/hr at 12/11/15 1031 50 mcg/hr at 12/11/15 1031  . ondansetron (ZOFRAN) injection 4 mg  4 mg Intravenous Q6H PRN Erin Fulling, MD      . pantoprazole (PROTONIX) 80 mg in sodium chloride 0.9 % 100 mL IVPB  80 mg Intravenous BID Erin Fulling, MD   80 mg at 12/11/15 0819  . senna-docusate (Senokot-S) tablet 2 tablet  2 tablet Oral QHS PRN Lewie Loron, NP      . sodium chloride flush (NS) 0.9 % injection 3 mL  3 mL Intravenous Q12H Erin Fulling, MD      . sodium chloride flush (NS) 0.9 % injection 3 mL  3 mL Intravenous PRN Erin Fulling, MD      . venlafaxine XR (EFFEXOR-XR) 24 hr capsule 150 mg  150 mg Oral Q breakfast Lewie Loron, NP        Allergies as of 12/10/2015 - Review Complete 12/10/2015  Allergen Reaction Noted  . Aspirin Anaphylaxis and Palpitations 05/24/2015  . Tramadol Anaphylaxis 05/24/2015  . Latex  05/24/2015  . Vicodin [hydrocodone-acetaminophen] Palpitations 11/11/2015     Review of Systems:    This is positive for those things mentioned in the HPI. All other review of systems are negative.       Physical Exam:  Vital signs in last 24 hours: Temp:  [98.1 F (36.7 C)-98.7 F (37.1 C)] 98.1 F (36.7 C) (08/12 0200) Pulse Rate:  [81-124] 81 (08/12 0800) Resp:  [12-30] 15 (08/12 0800) BP: (98-142)/(60-98) 114/74 (08/12  0800) SpO2:  [93 %-100 %] 93 % (08/12 0800) Weight:  [80.7 kg (178 lb)-87 kg (191 lb 12.8 oz)] 87 kg (191 lb 12.8 oz) (08/12 0500) Last BM Date: 12/10/15  General:  Well-developed, well-nourished and in no acute  distress Eyes:  anicteric. ENT:   Mouth and posterior pharynx free of lesions.  Neck:   supple w/o thyromegaly or mass.  Lungs: Clear to auscultation bilaterally. Heart:  S1S2, no rubs, murmurs, gallops. Abdomen:  soft, non-tender, no hepatosplenomegaly, hernia, or mass and BS+.  Rectal: Lymph:  no cervical or supraclavicular adenopathy. Extremities:   no edema Skin   no rash. Neuro:  A&O x 3.  Psych:  appropriate mood and  Affect.   Data Reviewed:   LAB RESULTS:  Recent Labs  12/10/15 1329 12/11/15 0322  WBC 12.6* 11.2*  HGB 11.7* 10.4*  HCT 35.0 29.9*  PLT 155 152   BMET  Recent Labs  12/10/15 1329 12/11/15 0322  NA 137 139  K 4.1 3.6  CL 111 110  CO2 18* 22  GLUCOSE 212* 104*  BUN 29* 27*  CREATININE 0.57 0.82  CALCIUM 8.4* 7.9*   LFT  Recent Labs  12/10/15 1329  PROT 6.7  ALBUMIN 3.0*  AST 78*  ALT 60*  ALKPHOS 129*  BILITOT 1.1   PT/INR  Recent Labs  12/10/15 1329 12/11/15 0706  LABPROT 16.7* 15.6*  INR 1.34 1.23    STUDIES: Dg Abdomen 1 View  Result Date: 12/10/2015 CLINICAL DATA:  Upper abdominal pain. Bloody emesis. Screened for obstruction EXAM: ABDOMEN - 1 VIEW COMPARISON:  CT 07/21/2010 FINDINGS: Surgical clips are noted in the right upper quadrant m left upper quadrant. Status post lumbar fusion. Bowel gas pattern is nonobstructive. No evidence for organomegaly. No abnormal calcifications. IMPRESSION: Nonobstructive bowel gas pattern.  Postoperative changes. Electronically Signed   By: Norva PavlovElizabeth  Brown M.D.   On: 12/10/2015 14:54   Dg Chest Port 1 View  Result Date: 12/11/2015 CLINICAL DATA:  Interval change EXAM: PORTABLE CHEST 1 VIEW COMPARISON:  12/10/2015 FINDINGS: The heart size and mediastinal contours are within  normal limits. Both lungs are clear. The visualized skeletal structures are unremarkable. IMPRESSION: No active disease. Electronically Signed   By: Marlan Palauharles  Clark M.D.   On: 12/11/2015 07:59   Dg Chest Portable 1 View  Result Date: 12/10/2015 CLINICAL DATA:  55 year old female with a history of difficulty breathing EXAM: PORTABLE CHEST 1 VIEW COMPARISON:  11/12/2015, 08/11/2015, CT 05/24/2015 FINDINGS: Cardiomediastinal silhouette unchanged. Surgical changes of the cervical region. Low lung volumes. No confluent airspace disease, pneumothorax, or pleural effusion. No displaced fracture. IMPRESSION: No radiographic evidence of acute cardiopulmonary disease. Signed, Yvone NeuJaime S. Loreta AveWagner, DO [facility] Electronically Signed   By: Gilmer MorJaime  Wagner D.O.   On: 12/10/2015 14:03     PREVIOUS ENDOSCOPIES:               Thanks  Lacey JensenSteven Yianni Skilling MD   LOS: 1 day   @  12/11/2015, 10:40 AM

## 2015-12-11 NOTE — H&P (Signed)
PULMONARY / CRITICAL CARE MEDICINE   Name: KERIN KREN MRN: [id number] DOB: Jan 23, 1961    ADMISSION DATE:  12/10/2015  REFERRING MD:  Dr. Fanny Bien  CHIEF COMPLAINT:  Bloody emesis and difficulty breathing  HISTORY OF PRESENT ILLNESS:   This is a 55 yo Caucasian female with a PMH polysubstance abuse, Hep C, liver cirrhosis, COPD, T2DM, CAD s/p Stent placement on plavix,, and CHF who presented to the ED Via EMS with hematemesis and difficulty breathing. Patient states that she was in her usual state of health when last night she felt really weak and hot and when she got up, she felt nauseas and started vomiting. She vomited about 4 times and emesis was dark red blood with clots. She was found on the floor, confused by her children and hence EMS was called. Upon EMS' arrival, her HR was in the 120s and her blood pressure was normal. Per her daughter, patient had taken off all her clothes and appeared confused when she was found.  No further hematemesis noted since ED presentation. She reports mild diffuse abdominal pain, sob and mild nausea. She is a 3-4 pack/day smoker.  H/H stable and her blood pressure is stable.   PAST MEDICAL HISTORY :  She  has a past medical history of CHF (congestive heart failure) (HCC); Cirrhosis (HCC); COPD (chronic obstructive pulmonary disease) (HCC); Diabetes mellitus without complication (HCC); Hepatitis C; and Hypertension.  PAST SURGICAL HISTORY: She  has a past surgical history that includes Cardiac surgery and Back surgery.  Allergies  Allergen Reactions  . Aspirin Anaphylaxis and Palpitations    Patient takes ibuprofen as home med, so no problem with NSAIDs  . Tramadol Anaphylaxis  . Latex   . Vicodin [Hydrocodone-Acetaminophen] Palpitations    No current facility-administered medications on file prior to encounter.    Current Outpatient Prescriptions on File Prior to Encounter  Medication Sig  . ascorbic acid (VITAMIN C) 1000 MG tablet Take 1,000  mg by mouth daily.  . clopidogrel (PLAVIX) 75 MG tablet Take 75 mg by mouth daily.  . diphenhydrAMINE (BENADRYL) 50 MG capsule Take 50 mg by mouth every 6 (six) hours as needed.  . docusate sodium (COLACE) 100 MG capsule Take 100 mg by mouth 2 (two) times daily.  Marland Kitchen gabapentin (NEURONTIN) 300 MG capsule Take 300 mg by mouth 3 (three) times daily.  . hydrOXYzine (ATARAX/VISTARIL) 10 MG tablet Take 10 mg by mouth 3 (three) times daily as needed.  Marland Kitchen ibuprofen (ADVIL,MOTRIN) 200 MG tablet Take 1 tablet (200 mg total) by mouth every 6 (six) hours as needed. X 5 days  . venlafaxine XR (EFFEXOR-XR) 150 MG 24 hr capsule Take 150 mg by mouth daily with breakfast.    FAMILY HISTORY:  Her indicated that her mother is deceased. She indicated that her father is deceased.    SOCIAL HISTORY: She  reports that she has been smoking Cigarettes.  She has been smoking about 1.00 pack per day. She has never used smokeless tobacco. She reports that she drinks alcohol. She reports that she uses drugs, including "Crack" cocaine.  REVIEW OF SYSTEMS:   Constitutional: Negative for fever and chills.  HENT: Negative for congestion and rhinorrhea.  Eyes: Negative for redness and visual disturbance.  Respiratory: Positive for shortness of breath but negative for wheezing.  Cardiovascular: Negative for chest pain and palpitations.  Gastrointestinal: Positive  for nausea , vomiting and abdominal pain but denies loose stools Genitourinary: Negative for dysuria and urgency.  Endocrine: Denies  polyuria, polyphagia and heat intolerance Musculoskeletal: Negative for myalgias and arthralgias.  Skin: Negative for pallor and wound.  Neurological: Negative for dizziness and headaches   SUBJECTIVE:   VITAL SIGNS: BP 140/74   Pulse (!) 105   Temp 98.7 F (37.1 C) (Oral)   Resp 17   Ht  (1.626 m)   Wt 189 lb 13.1 oz (86.1 kg)   SpO2 98%   BMI 32.58 kg/m   HEMODYNAMICS:    VENTILATOR SETTINGS:    INTAKE /  OUTPUT: I/O last 3 completed shifts: In: 25 [I.V.:25] Out: 250 [Urine:250]  PHYSICAL EXAMINATION: General: Looks older for age, restless Neuro: AAO X 3, follows commands, moves all extremities HEENT: [place]/AT, PERRLA, oral mucosa moist and pink Cardiovascular: RRR, S1/S2, no MRG Lungs: Normal WOB, bilateral airflow, diminished in the bases, no wheezing Abdomen: Soft, non-distended, normal bowel sounds, ,moderate epigastric and lower quadrant tenderness on palpation Musculoskeletal: +ROM, no joint deformities Skin: Warm and dry  LABS:  BMET  Recent Labs Lab 12/10/15 1329  NA 137  K 4.1  CL 111  CO2 18*  BUN 29*  CREATININE 0.57  GLUCOSE 212*    Electrolytes  Recent Labs Lab 12/10/15 1329  CALCIUM 8.4*    CBC  Recent Labs Lab 12/10/15 1329  WBC 12.6*  HGB 11.7*  HCT 35.0  PLT 155    Coag's  Recent Labs Lab 12/10/15 1329  APTT 32  INR 1.34    Sepsis Markers  Recent Labs Lab 12/10/15 1337 12/10/15 1715  LATICACIDVEN 2.6* 2.0*    ABG No results for input(s): PHART, PCO2ART, PO2ART in the last 168 hours.  Liver Enzymes  Recent Labs Lab 12/10/15 1329  AST 78*  ALT 60*  ALKPHOS 129*  BILITOT 1.1  ALBUMIN 3.0*    Cardiac Enzymes  Recent Labs Lab 12/10/15 1329  TROPONINI 0.03*    Glucose  Recent Labs Lab 12/10/15 1535 12/10/15 2006  GLUCAP 157* 116*    Imaging Dg Abdomen 1 View  Result Date: 12/10/2015 CLINICAL DATA:  Upper abdominal pain. Bloody emesis. Screened for obstruction EXAM: ABDOMEN - 1 VIEW COMPARISON:  CT 07/21/2010 FINDINGS: Surgical clips are noted in the right upper quadrant m left upper quadrant. Status post lumbar fusion. Bowel gas pattern is nonobstructive. No evidence for organomegaly. No abnormal calcifications. IMPRESSION: Nonobstructive bowel gas pattern.  Postoperative changes. Electronically Signed   By: Norva Pavlov M.D.   On: 12/10/2015 14:54   Dg Chest Portable 1 View  Result Date:  12/10/2015 CLINICAL DATA:  55 year old female with a history of difficulty breathing EXAM: PORTABLE CHEST 1 VIEW COMPARISON:  11/12/2015, 08/11/2015, CT 05/24/2015 FINDINGS: Cardiomediastinal silhouette unchanged. Surgical changes of the cervical region. Low lung volumes. No confluent airspace disease, pneumothorax, or pleural effusion. No displaced fracture. IMPRESSION: No radiographic evidence of acute cardiopulmonary disease. Signed, Yvone Neu. Loreta Ave, DO [facility] Electronically Signed   By: Gilmer Mor D.O.   On: 12/10/2015 14:03    STUDIES:  None  CULTURES: None  ANTIBIOTICS: none  SIGNIFICANT EVENTS: 08/11: Hematemesis>admitted with GIB  LINES/TUBES: PIVs  DISCUSSION: 55 y/o female, with a h/o hep C, cirrhosis, CAD/stent, COPD and diabetes  presenting with acute GIB on plavix and generalized abdominal pain.   ASSESSMENT / PLAN:  PULMONARY A: COPD without exacerbation Current smoker P:   -Supplemental O2 prn to Keep SPO>90% -Nebulized bronchodilators  CARDIOVASCULAR A:  H/o CAD s/p Stent H/o Hypertension P:  -Hemodynamics per ICU -D/C  plavix  RENAL A:   No acute issues P:   -Monitor and replace electrolytes  GASTROINTESTINAL A:   Acute GIB-hematemesis resloved Abdominal pain H/o Hep C-elevated AST/ALT/alkaline phos P:   -Awaiting GI input -Continue octreotide and PPI infusions -Trend CBC and LFTs -Will consider CT abdomen and plevis if abdominal pain persists or worsens -Keep NPO and advance to clear liquids in am if no further hematemesis  HEMATOLOGIC A:   Acute blood loss anemia-H/H down from 13.1/38 one month ago to 10.4/29.9. P:  -Trend H/H and transfuse prn -Monitor coagulation profile -D/C palvix  INFECTIOUS A:   No acute issues P:   -Monitor for fever and pan culture if febrile  ENDOCRINE A:   H/O T2DM P:   -Monitor blood glucose with SSI coverage  NEUROLOGIC A:    H/O Polysubstance abuse-Urine tox negative Mild confusion P:   RASS goal: n/a -Monitor neuro status -Will check ammonia level  Disposition and family update: Patient's daughter at bedside; updated on current treatment. Further changes in treatment in treatment plan pending clinical course and diagnostics  Best Practice: Code Status:  Full. Diet: npo GI prophylaxis:  PPI. VTE prophylaxis:  SCD's / no pharmacologic VTE prophylaxis 2/2 GIB  Magdalene S. [facility] ANP-BC Pulmonary and Critical Care Medicine [facility] Pager [phone] or [phone]   12/10/2015, 8:00PM STAFF NOTE: I, Dr. Lucie LeatherKurian David Kiven Vangilder,  have personally reviewed patient's available data, including medical history, events of note, physical examination and test results as part of my evaluation. I have discussed with NP and other care providers such as pharmacist, RN and RRT. I have personally reviewed/obtained a history, examined the patient, evaluated Pertinent laboratory and RadioGraphic/imaging results, and  formulated the assessment and plan   The Patient requires high complexity decision making for assessment and support, frequent evaluation and titration of therapies, application of advanced monitoring technologies and extensive interpretation of multiple databases. Overall,  prognosis is guarded.   Awaiting GI recs   Lucie LeatherKurian David Liviya Santini, M.D.  [facility]  Medical Director [facility] Medical Director [facility] Card[facility]-P[facility] Department

## 2015-12-11 NOTE — Consult Note (Signed)
CH provided pastoral care & prayer to PT & daughter at bedside as requested.

## 2015-12-11 NOTE — Progress Notes (Signed)
Patient alert and oriented. Patient is on room air oxygenating upper 90's. No active bleeding noted and hemoglobin remained stable per am labs. EGD performed today. Patients vitals have remained stable. Patient is tolerating soft diet and using bed pan with no complications. Patient has had chronic back pain. Repositioned and pain medication given- WARM blanket. Awaiting for transfer to floor.

## 2015-12-11 NOTE — Progress Notes (Signed)
Patient was able to complete health care power of attorney with daughter and chaplin. Hgb down to 10.4 from 11.7. NPO at this time. Did complain of headache and was medicated with Norco. Complained of shortness of breath and NP ordered scheduled neb treatments.  Continues to have tachycardia on the monitor. No other concerns at this time.

## 2015-12-11 NOTE — Progress Notes (Signed)
PULMONARY / CRITICAL CARE MEDICINE   Name: Meghan Jimenez MRN: [id number] DOB: Sep 10, 1960    ADMISSION DATE:  12/10/2015  REFERRING MD:  Dr. Fanny Bien  CHIEF COMPLAINT:  Bloody emesis and difficulty breathing  HISTORY OF PRESENT ILLNESS:   This is a 55 yo Caucasian female with a PMH polysubstance abuse, Hep C, liver cirrhosis, COPD, T2DM, CAD s/p Stent placement on plavix,, and CHF who presented to the ED Via EMS with hematemesis and difficulty breathing. Patient states that she was in her usual state of health when last night she felt really weak and hot and when she got up, she felt nauseas and started vomiting. She vomited about 4 times and emesis was dark red blood with clots. She was found on the floor, confused by her children and hence EMS was called. Upon EMS' arrival, her HR was in the 120s and her blood pressure was normal. Per her daughter, patient had taken off all her clothes and appeared confused when she was found.  No further hematemesis noted since ED presentation. She reports mild diffuse abdominal pain, sob and mild nausea. She is a 3-4 pack/day smoker.  H/H stable and her blood pressure is stable.   SUBJECTIVE: Reported mild-moderate dyspnea and headache. Symptoms improved with one nebulizer treatment and norco 5/325. No other issues  VITAL SIGNS: BP 112/71   Pulse 100   Temp 98.1 F (36.7 C) (Oral)   Resp 14   Ht 5\' 4"  (1.626 m)   Wt 191 lb 12.8 oz (87 kg)   SpO2 94%   BMI 32.92 kg/m   HEMODYNAMICS:    VENTILATOR SETTINGS:    INTAKE / OUTPUT: I/O last 3 completed shifts: In: 550 [P.O.:150; I.V.:300; IV Piggyback:100] Out: 1375 [Urine:1375]  PHYSICAL EXAMINATION: General: Older for age, lying in bed comfortably Neuro: AAO X 3, follows commands, moves all extremities HEENT: [place]/AT, PERRLA, oral mucosa moist and pink Cardiovascular: mildly tachycardiac, regular, S1/S2, no MRG Lungs: Normal WOB, bilateral airflow, diminished in the bases, no  wheezing Abdomen: Soft, non-distended, normal bowel sounds, ,moderate epigastric and lower quadrant tenderness on palpation Musculoskeletal: +ROM, no joint deformities Skin: Warm and dry  LABS:  BMET  Recent Labs Lab 12/10/15 1329 12/11/15 0322  NA 137 139  K 4.1 3.6  CL 111 110  CO2 18* 22  BUN 29* 27*  CREATININE 0.57 0.82  GLUCOSE 212* 104*    Electrolytes  Recent Labs Lab 12/10/15 1329 12/11/15 0322 12/11/15 0336  CALCIUM 8.4* 7.9*  --   MG  --   --  2.1  PHOS  --   --  3.1    CBC  Recent Labs Lab 12/10/15 1329 12/11/15 0322  WBC 12.6* 11.2*  HGB 11.7* 10.4*  HCT 35.0 29.9*  PLT 155 152    Coag's  Recent Labs Lab 12/10/15 1329  APTT 32  INR 1.34    Sepsis Markers  Recent Labs Lab 12/10/15 1337 12/10/15 1715 12/11/15 0322  LATICACIDVEN 2.6* 2.0*  --   PROCALCITON  --   --  0.27    ABG  Recent Labs Lab 12/11/15 0500  PHART 7.50*  PCO2ART 27*  PO2ART 75*    Liver Enzymes  Recent Labs Lab 12/10/15 1329  AST 78*  ALT 60*  ALKPHOS 129*  BILITOT 1.1  ALBUMIN 3.0*    Cardiac Enzymes  Recent Labs Lab 12/10/15 1329  TROPONINI 0.03*    Glucose  Recent Labs Lab 12/10/15 1535 12/10/15 2006 12/11/15 0010 12/11/15 0409  GLUCAP 157* 116* 90 104*    Imaging Dg Abdomen 1 View  Result Date: 12/10/2015 CLINICAL DATA:  Upper abdominal pain. Bloody emesis. Screened for obstruction EXAM: ABDOMEN - 1 VIEW COMPARISON:  CT 07/21/2010 FINDINGS: Surgical clips are noted in the right upper quadrant m left upper quadrant. Status post lumbar fusion. Bowel gas pattern is nonobstructive. No evidence for organomegaly. No abnormal calcifications. IMPRESSION: Nonobstructive bowel gas pattern.  Postoperative changes. Electronically Signed   By: Norva Pavlov M.D.   On: 12/10/2015 14:54   Dg Chest Portable 1 View  Result Date: 12/10/2015 CLINICAL DATA:  55 year old female with a history of difficulty breathing EXAM: PORTABLE CHEST 1  VIEW COMPARISON:  11/12/2015, 08/11/2015, CT 05/24/2015 FINDINGS: Cardiomediastinal silhouette unchanged. Surgical changes of the cervical region. Low lung volumes. No confluent airspace disease, pneumothorax, or pleural effusion. No displaced fracture. IMPRESSION: No radiographic evidence of acute cardiopulmonary disease. Signed, Yvone Neu. Loreta Ave, DO [facility] Electronically Signed   By: Gilmer Mor D.O.   On: 12/10/2015 14:03    STUDIES:  None  CULTURES: None  ANTIBIOTICS: none  SIGNIFICANT EVENTS: 08/11: Hematemesis>admitted with GIB  LINES/TUBES: PIVs  DISCUSSION: 55 y/o female, with a h/o hep C, cirrhosis, CAD/stent, COPD and diabetes  presenting with acute GIB on plavix and generalized abdominal pain.   ASSESSMENT / PLAN:  PULMONARY A: COPD without exacerbation Current smoker P:   -Supplemental O2 prn to Keep SPO>90% -Nebulized bronchodilators  CARDIOVASCULAR A:  H/o CAD s/p Stent H/o Hypertension P:  -Hemodynamics per ICU -D/C plavix  RENAL A:   No acute issues P:   -Monitor and replace electrolytes  GASTROINTESTINAL A:   Acute GIB-hematemesis resloved Abdominal pain H/o Hep C-elevated AST/ALT/alkaline phos P:   -Awaiting GI input -Continue octreotide and PPI infusions -Trend CBC and LFTs -Will consider CT abdomen and plevis if abdominal pain persists or worsens -Keep NPO and advance to clear liquids in am if no further hematemesis  HEMATOLOGIC A:   Acute blood loss anemia-H/H down from 13.1/38 one month ago to 10.4/29.9. P:  -Trend H/H and transfuse prn -Monitor coagulation profile -D/C palvix  INFECTIOUS A:   No acute issues P:   -Monitor for fever and pan culture if febrile  ENDOCRINE A:   H/O T2DM P:   -Monitor blood glucose with SSI coverage  NEUROLOGIC A:   H/O Polysubstance abuse-Urine tox negative Mild confusion P:   RASS goal: n/a -Monitor neuro  status -Will check ammonia level  Disposition and family update:No family at bedside. Will update once available  Best Practice: Code Status:  Full. Diet: npo GI prophylaxis:  PPI. VTE prophylaxis:  SCD's / no pharmacologic VTE prophylaxis 2/2 GIB  Magdalene S. [facility] ANP-BC Pulmonary and Critical Care Medicine [facility] Pager [phone] or [phone]   12/10/2015, 8:00PM STAFF NOTE: I, Dr. Lucie Leather,  have personally reviewed patient's available data, including medical history, events of note, physical examination and test results as part of my evaluation. I have discussed with NP and other care providers such as pharmacist, RN and RRT.  The Rest per NP whose note is outlined above and that I agree with  Alert and awake, follows commands, abd soreness persistent Awaiting GI consult recs  I have personally reviewed/obtained a history, examined the patient, evaluated Pertinent laboratory and RadioGraphic/imaging results, and  formulated the assessment and plan   The Patient requires high complexity decision making for assessment and support, frequent evaluation and titration of  therapies, application of advanced monitoring technologies and extensive interpretation of multiple databases.   Overall,prognosis is guarded.   Recommend Palliative care consult for goals of care  Teneil Shiller Santiago Gladavid Jaylea Plourde, M.D.  [facility]  Medical Director [facility] Medical Director [facility] Cardio-Pulmonary Department

## 2015-12-12 LAB — GLUCOSE, CAPILLARY
GLUCOSE-CAPILLARY: 111 mg/dL — AB (ref 65–99)
GLUCOSE-CAPILLARY: 100 mg/dL — AB (ref 65–99)
GLUCOSE-CAPILLARY: 114 mg/dL — AB (ref 65–99)
GLUCOSE-CAPILLARY: 223 mg/dL — AB (ref 65–99)
Glucose-Capillary: 132 mg/dL — ABNORMAL HIGH (ref 65–99)
Glucose-Capillary: 204 mg/dL — ABNORMAL HIGH (ref 65–99)
Glucose-Capillary: 137 mg/dL — ABNORMAL HIGH (ref 65–99)
Glucose-Capillary: 189 mg/dL — ABNORMAL HIGH (ref 65–99)

## 2015-12-12 LAB — URINE CULTURE

## 2015-12-12 MED ORDER — IPRATROPIUM-ALBUTEROL 0.5-2.5 (3) MG/3ML IN SOLN
3.0000 mL | Freq: Four times a day (QID) | RESPIRATORY_TRACT | Status: DC
  Administered 2015-12-12 – 2015-12-14 (×7): 3 mL via RESPIRATORY_TRACT
  Filled 2015-12-12 (×7): qty 3

## 2015-12-12 MED ORDER — METHYLPREDNISOLONE SODIUM SUCC 40 MG IJ SOLR
40.0000 mg | Freq: Four times a day (QID) | INTRAMUSCULAR | Status: DC
  Administered 2015-12-12 – 2015-12-14 (×8): 40 mg via INTRAVENOUS
  Filled 2015-12-12 (×8): qty 1

## 2015-12-12 MED ORDER — LEVOFLOXACIN 500 MG PO TABS
500.0000 mg | ORAL_TABLET | Freq: Every day | ORAL | Status: DC
Start: 2015-12-12 — End: 2015-12-14
  Administered 2015-12-12 – 2015-12-14 (×3): 500 mg via ORAL
  Filled 2015-12-12 (×3): qty 1

## 2015-12-12 MED ORDER — MOMETASONE FURO-FORMOTEROL FUM 100-5 MCG/ACT IN AERO
2.0000 | INHALATION_SPRAY | Freq: Two times a day (BID) | RESPIRATORY_TRACT | Status: DC
  Administered 2015-12-12 – 2015-12-14 (×5): 2 via RESPIRATORY_TRACT
  Filled 2015-12-12: qty 8.8

## 2015-12-12 NOTE — Consult Note (Signed)
GI Inpatient Follow-up Note  Patient Identification: Meghan Jimenez is a 55 y.o. female presented with upper GI bleed. EGD yesterday showed diffuse gastritis and portal hypertensive gastropathy.  Hemostatic clip applied to minor bleeding of small AVM.Marland Kitchen Non bleeding small grade 1 distal esophageal varix was present.    Today eating regular diet. No signs of bleeding. HGB still pending.   Subjective:  Scheduled Inpatient Medications:  . antiseptic oral rinse  7 mL Mouth Rinse BID  . cefTRIAXone (ROCEPHIN)  IV  1 g Intravenous Daily  . gabapentin  300 mg Oral TID  . insulin aspart  0-9 Units Subcutaneous Q4H  . ipratropium-albuterol  3 mL Nebulization Q6H  . levofloxacin  500 mg Oral Daily  . mometasone-formoterol  2 puff Inhalation BID  . nicotine  21 mg Transdermal Daily  . pantoprazole (PROTONIX) IVPB  80 mg Intravenous BID  . sodium chloride flush  3 mL Intravenous Q12H  . venlafaxine XR  150 mg Oral Q breakfast    Continuous Inpatient Infusions:     PRN Inpatient Medications:  sodium chloride, sodium chloride, HYDROcodone-acetaminophen, hydrOXYzine, ondansetron (ZOFRAN) IV, senna-docusate, sodium chloride flush  Review of Systems:  Constitutional: Weight is stable.  Eyes: No changes in vision. ENT: No oral lesions, sore throat.  GI: see HPI.  Heme/Lymph: No easy bruising.  CV: No chest pain.  GU: No hematuria.  Integumentary: No rashes.  Neuro: No headaches.  Psych: No depression/anxiety.  Endocrine: No heat/cold intolerance.  Allergic/Immunologic: No urticaria.  Resp: No cough, SOB.  Musculoskeletal: No joint swelling.    Physical Examination: BP (!) 118/55 (BP Location: Right Arm)   Pulse 94   Temp 98.6 F (37 C) (Oral)   Resp 18   Ht  (1.549 m)   Wt 89 kg (196 lb 3.4 oz)   SpO2 95%   BMI 37.07 kg/m  Gen: NAD, alert and oriented x 4 HEENT: PEERLA, EOMI, Neck: supple, no JVD or thyromegaly Chest: CTA bilaterally, no wheezes, crackles, or other  adventitious sounds CV: RRR, no m/g/c/r Abd: soft, NT, ND, +BS in all four quadrants; no HSM, guarding, ridigity, or rebound tenderness Ext: no edema Skin: no rash or lesions noted   Data: Lab Results  Component Value Date   WBC 11.2 (H) 12/11/2015   HGB 10.4 (L) 12/11/2015   HCT 29.9 (L) 12/11/2015   MCV 81.8 12/11/2015   PLT 152 12/11/2015    Recent Labs Lab 12/10/15 1329 12/11/15 0322  HGB 11.7* 10.4*   Lab Results  Component Value Date   NA 139 12/11/2015   K 3.6 12/11/2015   CL 110 12/11/2015   CO2 22 12/11/2015   BUN 27 (H) 12/11/2015   CREATININE 0.82 12/11/2015   Lab Results  Component Value Date   ALT 60 (H) 12/10/2015   AST 78 (H) 12/10/2015   ALKPHOS 129 (H) 12/10/2015   BILITOT 1.1 12/10/2015    Recent Labs Lab 12/10/15 1329 12/11/15 0706  APTT 32  --   INR 1.34 1.23    Assessment/Plan: [name] is a 55 y.o. female upper GI bleed secondary to gastritis and portal hypertensive gastropathy. Incidental distal non bleeding esophageal varix present.    Hepatitis C with cirrhosis.  Recommendations:  Resume Plavix if HBG stable observe for bleeding. Continue PPI bid. No NSAIDS OR ASA  Referral to Hepatology for treatment for hepatitis C.  Provide medication alternatives for pain other than NSAID or ASA products.   Please call with questions or  concerns.  Lacey JensenSteven Kaina Orengo, MD

## 2015-12-12 NOTE — Plan of Care (Signed)
Problem: Pain Managment: Goal: General experience of comfort will improve Outcome: Progressing Hydrocodone-acetaminophen (5-325) given 1 (one) tablet times 2 (two) doses. Improvement noted.  Problem: Physical Regulation: Goal: Will remain free from infection Outcome: Progressing Rocephin 1 (one) gram IV daily. WBC's improving.

## 2015-12-12 NOTE — Progress Notes (Signed)
Spoke with Dr Auburn BilberryShreyang Patel, will transfer care to hospitalist service 8/12 No pulmonary or ICU issues at this time    Lucie LeatherKurian David Derwin Reddy, M.D.  [facility]  Medical Director [facility] Medical Director [facility] Cardio-Pulmonary Department

## 2015-12-12 NOTE — Progress Notes (Signed)
[facility] [place] [facility]                                                                                                                                                                                            Patient Demographics   Meghan BurkittSherry Jimenez, is a 55 y.o. female, DOB - 03-12-1961, ZOX:[id number]  Admit date - 12/10/2015   Admitting Physician Erin FullingKurian Kasa, MD  Outpatient Primary MD for the patient is [facility]   LOS - 2  Subjective:Patients care was Transferred from the intensivist to us. Patient initially admitted with upper GI bleed and shortness of breath. She reports that she has some cough with some specks of blood but no further hematemesis. Denies any abdominal pain Complains of shortness of breath and wheezing No chest pains     Review of Systems:   CONSTITUTIONAL: No documented fever. No fatigue, weakness. No weight gain, no weight loss.  EYES: No blurry or double vision.  ENT: No tinnitus. No postnasal drip. No redness of the oropharynx.  RESPIRATORY: Positive cough, positive wheeze, no hemoptysis. Positive dyspnea.  CARDIOVASCULAR: No chest pain. No orthopnea. No palpitations. No syncope.  GASTROINTESTINAL: No nausea, no vomiting or diarrhea. No abdominal pain. Positive hematemesis  GENITOURINARY: No dysuria or hematuria.  ENDOCRINE: No polyuria or nocturia. No heat or cold intolerance.  HEMATOLOGY: No anemia. No bruising. No bleeding.  INTEGUMENTARY: No rashes. No lesions.  MUSCULOSKELETAL: No arthritis. No swelling. No gout.  NEUROLOGIC: No numbness, tingling, or ataxia. No seizure-type activity.  PSYCHIATRIC: No anxiety. No insomnia. No ADD.    Vitals:   Vitals:   12/11/15 2037 12/11/15 2123 12/12/15 0341 12/12/15 0357  BP:  (!) 125/55  119/60  Pulse:  99  90  Resp:  18  20  Temp:  98.4 F (36.9 C)  98 F (36.7 C)  TempSrc:  Axillary  Oral  SpO2: 98% 100%  96%  Weight:   88.9 kg (196 lb) 89 kg  (196 lb 3.4 oz)  Height:        Wt Readings from Last 3 Encounters:  12/12/15 89 kg (196 lb 3.4 oz)  11/11/15 87 kg (191 lb 12.8 oz)  05/24/15 90.7 kg (200 lb)     Intake/Output Summary (Last 24 hours) at 12/12/15 1034 Last data filed at 12/12/15 0800  Gross per 24 hour  Intake          1613.75 ml  Output              702 ml  Net  911.75 ml    Physical Exam:   GENERAL: Pleasant-appearing in no apparent distress.  HEAD, EYES, EARS, NOSE AND THROAT: Atraumatic, normocephalic. Extraocular muscles are intact. Pupils equal and reactive to light. Sclerae anicteric. No conjunctival injection. No oro-pharyngeal erythema.  NECK: Supple. There is no jugular venous distention. No bruits, no lymphadenopathy, no thyromegaly.  HEART: Regular rate and rhythm,. No murmurs, no rubs, no clicks.  LUNGS: Occasional wheezing No rales or rhonchi. No assory muscle usage.  ABDOMEN: Soft, flat, nontender, nondistended. Has good bowel sounds. No hepatosplenomegaly appreciated.  EXTREMITIES: No evidence of any cyanosis, clubbing, or peripheral edema.  +2 pedal and radial pulses bilaterally.  NEUROLOGIC: The patient is alert, awake, and oriented x3 with no focal motor or sensory deficits appreciated bilaterally.  SKIN: Moist and warm with no rashes appreciated.  Psych: Not anxious, depressed LN: No inguinal LN enlargement    Antibiotics   Anti-infectives    Start     Dose/Rate Route Frequency Ordered Stop   12/12/15 1000  levofloxacin (LEVAQUIN) tablet 500 mg     500 mg Oral Daily 12/12/15 0953     12/10/15 1515  cefTRIAXone (ROCEPHIN) 1 g in dextrose 5 % 50 mL IVPB     1 g 100 mL/hr over 30 Minutes Intravenous Daily 12/10/15 1501        Medications   Scheduled Meds: . antiseptic oral rinse  7 mL Mouth Rinse BID  . cefTRIAXone (ROCEPHIN)  IV  1 g Intravenous Daily  . gabapentin  300 mg Oral TID  . insulin aspart  0-9 Units Subcutaneous Q4H  . ipratropium-albuterol  3 mL Nebulization  Q6H  . levofloxacin  500 mg Oral Daily  . mometasone-formoterol  2 puff Inhalation BID  . nicotine  21 mg Transdermal Daily  . pantoprazole (PROTONIX) IVPB  80 mg Intravenous BID  . sodium chloride flush  3 mL Intravenous Q12H  . venlafaxine XR  150 mg Oral Q breakfast   Continuous Infusions: . octreotide  (SANDOSTATIN)    IV infusion 50 mcg/hr (12/11/15 2322)   PRN Meds:.sodium chloride, sodium chloride, HYDROcodone-acetaminophen, hydrOXYzine, ondansetron (ZOFRAN) IV, senna-docusate, sodium chloride flush   Data Review:   Micro Results Recent Results (from the past 240 hour(s))  MRSA PCR Screening     Status: None   Collection Time: 12/10/15  3:39 PM  Result Value Ref Range Status   MRSA by PCR NEGATIVE NEGATIVE Final    Comment:        The GeneXpert MRSA Assay (FDA approved for NASAL specimens only), is one component of a comprehensive MRSA colonization surveillance program. It is not intended to diagnose MRSA infection nor to guide or monitor treatment for MRSA infections.     Radiology Reports Dg Abdomen 1 View  Result Date: 12/10/2015 CLINICAL DATA:  Upper abdominal pain. Bloody emesis. Screened for obstruction EXAM: ABDOMEN - 1 VIEW COMPARISON:  CT 07/21/2010 FINDINGS: Surgical clips are noted in the right upper quadrant m left upper quadrant. Status post lumbar fusion. Bowel gas pattern is nonobstructive. No evidence for organomegaly. No abnormal calcifications. IMPRESSION: Nonobstructive bowel gas pattern.  Postoperative changes. Electronically Signed   By: Norva Pavlov M.D.   On: 12/10/2015 14:54   Nm Myocar Multi W/spect W/wall Motion / Ef  Result Date: 11/12/2015  The study is normal.  This is a low risk study.  The left ventricular ejection fraction is normal (55-65%).  There was no ST segment deviation noted during stress.    Dg Chest  Port 1 View  Result Date: 12/11/2015 CLINICAL DATA:  Interval change EXAM: PORTABLE CHEST 1 VIEW COMPARISON:   12/10/2015 FINDINGS: The heart size and mediastinal contours are within normal limits. Both lungs are clear. The visualized skeletal structures are unremarkable. IMPRESSION: No active disease. Electronically Signed   By: Marlan Palau M.D.   On: 12/11/2015 07:59   Dg Chest Portable 1 View  Result Date: 12/10/2015 CLINICAL DATA:  55 year old female with a history of difficulty breathing EXAM: PORTABLE CHEST 1 VIEW COMPARISON:  11/12/2015, 08/11/2015, CT 05/24/2015 FINDINGS: Cardiomediastinal silhouette unchanged. Surgical changes of the cervical region. Low lung volumes. No confluent airspace disease, pneumothorax, or pleural effusion. No displaced fracture. IMPRESSION: No radiographic evidence of acute cardiopulmonary disease. Signed, Yvone Neu. Loreta Ave, DO [facility] Radiology Electronically Signed   By: Gilmer Mor D.O.   On: 12/10/2015 14:03     CBC  Recent Labs Lab 12/10/15 1329 12/11/15 0322  WBC 12.6* 11.2*  HGB 11.7* 10.4*  HCT 35.0 29.9*  PLT 155 152  MCV 83.7 81.8  MCH 28.0 28.4  MCHC 33.4 34.7  RDW 16.4* 16.1*    Chemistries   Recent Labs Lab 12/10/15 1329 12/11/15 0322 12/11/15 0336  NA 137 139  --   K 4.1 3.6  --   CL 111 110  --   CO2 18* 22  --   GLUCOSE 212* 104*  --   BUN 29* 27*  --   CREATININE 0.57 0.82  --   CALCIUM 8.4* 7.9*  --   MG  --   --  2.1  AST 78*  --   --   ALT 60*  --   --   ALKPHOS 129*  --   --   BILITOT 1.1  --   --    ------------------------------------------------------------------------------------------------------------------ estimated creatinine clearance is 76.8 mL/min (by C-G formula based on SCr of 0.82 mg/dL). ------------------------------------------------------------------------------------------------------------------ No results for input(s): HGBA1C in the last 72  hours. ------------------------------------------------------------------------------------------------------------------ No results for input(s): CHOL, HDL, LDLCALC, TRIG, CHOLHDL, LDLDIRECT in the last 72 hours. ------------------------------------------------------------------------------------------------------------------ No results for input(s): TSH, T4TOTAL, T3FREE, THYROIDAB in the last 72 hours.  Invalid input(s): FREET3 ------------------------------------------------------------------------------------------------------------------ No results for input(s): VITAMINB12, FOLATE, FERRITIN, TIBC, IRON, RETICCTPCT in the last 72 hours.  Coagulation profile  Recent Labs Lab 12/10/15 1329 12/11/15 0706  INR 1.34 1.23    No results for input(s): DDIMER in the last 72 hours.  Cardiac Enzymes  Recent Labs Lab 12/10/15 1329  TROPONINI 0.03*   ------------------------------------------------------------------------------------------------------------------ Invalid input(s): POCBNP    Assessment & Plan   Patient is a 55 year old white female with history of polysubstance abuse, hepatitis C, liver cirrhosis, COPD, type 2 diabetes, coronary artery disease status post stent placement on Plavix and CHF presented with upper GI bleed  1. Upper GI bleed due to a single minor bleeding and GU ectasia status post clip placement Appreciate GI input Repeat CBC in the morning Protonix twice a day Avoid NSAIDs Plavix on hold for 3 days total since EGD if no bleeding  2. Shortness of breath with coughing Acute bronchitis with COPD flare I'll start her on Levaquin orally Nebulizers   3. Diabetes type 2 Continue sliding scale insulin  4. Coronary artery disease with previous stent Plavix being held resume in 2 days more  5. Liver cirrhosis and hepatitis C     Code Status Orders        Start     Ordered   12/10/15  1454  Full code  Continuous     12/10/15 1455    Code  Status History    Date Active Date Inactive Code Status Order ID Comments User Context   11/11/2015  6:38 PM 11/12/2015  9:01 PM Full Code [id number]  Alford Highland, MD ED           Consults  GI   DVT Prophylaxis SCD's  Lab Results  Component Value Date   PLT 152 12/11/2015     Time Spent in minutes   Greater than 50% of time spent in care coordination and counseling patient regarding the condition and plan of care.   Auburn Bilberry M.D on 12/12/2015 at 10:34 AM  Between 7am to 6pm - Pager - [phone]  After 6pm go to [email or web address] - password [email or web address] [facility]  [facility]   Office  [phone]

## 2015-12-13 ENCOUNTER — Encounter: Payer: Self-pay | Admitting: Radiology

## 2015-12-13 ENCOUNTER — Inpatient Hospital Stay: Payer: Medicare HMO

## 2015-12-13 DIAGNOSIS — F341 Dysthymic disorder: Secondary | ICD-10-CM

## 2015-12-13 DIAGNOSIS — F4310 Post-traumatic stress disorder, unspecified: Secondary | ICD-10-CM

## 2015-12-13 LAB — CBC
HEMATOCRIT: 26 % — AB (ref 35.0–47.0)
Hemoglobin: 9.1 g/dL — ABNORMAL LOW (ref 12.0–16.0)
MCH: 29 pg (ref 26.0–34.0)
MCHC: 34.8 g/dL (ref 32.0–36.0)
MCV: 83.3 fL (ref 80.0–100.0)
PLATELETS: 99 10*3/uL — AB (ref 150–440)
RBC: 3.12 MIL/uL — AB (ref 3.80–5.20)
RDW: 16.4 % — ABNORMAL HIGH (ref 11.5–14.5)
WBC: 6 10*3/uL (ref 3.6–11.0)

## 2015-12-13 LAB — BASIC METABOLIC PANEL
ANION GAP: 7 (ref 5–15)
BUN: 14 mg/dL (ref 6–20)
CO2: 20 mmol/L — AB (ref 22–32)
Calcium: 7.9 mg/dL — ABNORMAL LOW (ref 8.9–10.3)
Chloride: 109 mmol/L (ref 101–111)
Creatinine, Ser: 0.73 mg/dL (ref 0.44–1.00)
GFR calc Af Amer: >60 mL/min (ref >60)
GLUCOSE: 174 mg/dL — AB (ref 65–99)
POTASSIUM: 3.9 mmol/L (ref 3.5–5.1)
Sodium: 136 mmol/L (ref 135–145)

## 2015-12-13 LAB — GLUCOSE, CAPILLARY
GLUCOSE-CAPILLARY: 161 mg/dL — AB (ref 65–99)
GLUCOSE-CAPILLARY: 223 mg/dL — AB (ref 65–99)
GLUCOSE-CAPILLARY: 207 mg/dL — AB (ref 65–99)
Glucose-Capillary: 144 mg/dL — ABNORMAL HIGH (ref 65–99)
Glucose-Capillary: 199 mg/dL — ABNORMAL HIGH (ref 65–99)
Glucose-Capillary: 350 mg/dL — ABNORMAL HIGH (ref 65–99)

## 2015-12-13 LAB — PROCALCITONIN: Procalcitonin: <0.1 ng/mL

## 2015-12-13 LAB — TROPONIN I

## 2015-12-13 MED ORDER — NITROGLYCERIN 0.4 MG SL SUBL
SUBLINGUAL_TABLET | SUBLINGUAL | Status: AC
  Filled 2015-12-13: qty 1

## 2015-12-13 MED ORDER — IOPAMIDOL (ISOVUE-370) INJECTION 76%
75.0000 mL | Freq: Once | INTRAVENOUS | Status: AC | PRN
  Administered 2015-12-13: 75 mL via INTRAVENOUS

## 2015-12-13 MED ORDER — NITROGLYCERIN 0.4 MG SL SUBL
0.4000 mg | SUBLINGUAL_TABLET | SUBLINGUAL | Status: DC | PRN
Start: 2015-12-13 — End: 2015-12-14
  Administered 2015-12-13 (×2): 0.4 mg via SUBLINGUAL
  Filled 2015-12-13: qty 1

## 2015-12-13 MED ORDER — INSULIN ASPART 100 UNIT/ML ~~LOC~~ SOLN
0.0000 [IU] | Freq: Every day | SUBCUTANEOUS | Status: DC
  Administered 2015-12-13: 2 [IU] via SUBCUTANEOUS
  Filled 2015-12-13: qty 2

## 2015-12-13 MED ORDER — HYDROCODONE-ACETAMINOPHEN 5-325 MG PO TABS
1.0000 | ORAL_TABLET | ORAL | Status: DC | PRN
  Administered 2015-12-13: 20:00:00 1 via ORAL
  Filled 2015-12-13: qty 1

## 2015-12-13 MED ORDER — ACETAMINOPHEN 325 MG PO TABS: 650.0000 mg | ORAL_TABLET | Freq: Four times a day (QID) | ORAL | Status: DC | PRN

## 2015-12-13 MED ORDER — INSULIN ASPART 100 UNIT/ML ~~LOC~~ SOLN
0.0000 [IU] | Freq: Three times a day (TID) | SUBCUTANEOUS | Status: DC
  Administered 2015-12-13: 1 [IU] via SUBCUTANEOUS
  Administered 2015-12-13: 12:00:00 7 [IU] via SUBCUTANEOUS
  Administered 2015-12-14: 1 [IU] via SUBCUTANEOUS
  Filled 2015-12-13: qty 1
  Filled 2015-12-13: qty 7
  Filled 2015-12-13: qty 1

## 2015-12-13 MED ORDER — TRAZODONE HCL 100 MG PO TABS
100.0000 mg | ORAL_TABLET | Freq: Every day | ORAL | Status: DC
  Administered 2015-12-13: 100 mg via ORAL
  Filled 2015-12-13: qty 1

## 2015-12-13 NOTE — Progress Notes (Signed)
[facility] [place] at [facility]                                                                                                                                                                                            Patient Demographics   Meghan BurkittSherry Jimenez, is a 55 y.o. female, DOB - [birth year], DGU:[id number]  Admit date - 12/10/2015   Admitting Physician Erin FullingKurian Kasa, MD  Outpatient Primary MD for the patient is [facility]   LOS - 3  Subjective:patient has multiple complaints, Complains of shortness of breath chest pain last night no further bleeding    Review of Systems:   CONSTITUTIONAL: No documented fever. No fatigue, weakness. No weight gain, no weight loss.  EYES: No blurry or double vision.  ENT: No tinnitus. No postnasal drip. No redness of the oropharynx.  RESPIRATORY: Positive cough, positive wheeze, no hemoptysis. Positive dyspnea.  CARDIOVASCULAR: Positive chest pain. No orthopnea. No palpitations. No syncope.  GASTROINTESTINAL: No nausea, no vomiting or diarrhea. No abdominal pain. Positive hematemesis  GENITOURINARY: No dysuria or hematuria.  ENDOCRINE: No polyuria or nocturia. No heat or cold intolerance.  HEMATOLOGY: No anemia. No bruising. No bleeding.  INTEGUMENTARY: No rashes. No lesions.  MUSCULOSKELETAL: No arthritis. No swelling. No gout.  NEUROLOGIC: No numbness, tingling, or ataxia. No seizure-type activity.  PSYCHIATRIC: No anxiety. No insomnia. No ADD.    Vitals:   Vitals:   12/13/15 0509 12/13/15 0542 12/13/15 0721 12/13/15 0826  BP: 133/62   136/66  Pulse: (!) 104   (!) 108  Resp: 20   18  Temp: 97.5 F (36.4 C)   97.9 F (36.6 C)  TempSrc: Oral   Oral  SpO2: 96%  96% 97%  Weight:  88.1 kg (194 lb 2 oz)    Height:        Wt Readings from Last 3 Encounters:  12/13/15 88.1 kg (194 lb 2 oz)  11/11/15 87 kg (191 lb 12.8 oz)  05/24/15 90.7 kg (200 lb)     Intake/Output Summary (Last 24 hours)  at 12/13/15 1032 Last data filed at 12/13/15 1023  Gross per 24 hour  Intake          1323.75 ml  Output              500 ml  Net           823.75 ml    Physical Exam:   GENERAL: Appears little anxious.  HEAD, EYES, EARS, NOSE AND THROAT: Atraumatic, normocephalic. Extraocular muscles are intact. Pupils equal and reactive to light. Sclerae anicteric. No conjunctival injection.  No oro-pharyngeal erythema.  NECK: Supple. There is no jugular venous distention. No bruits, no lymphadenopathy, no thyromegaly.  HEART: Regular rate and rhythm,. No murmurs, no rubs, no clicks.  LUNGS: Occasional wheezing No rales or rhonchi. No assory muscle usage.  ABDOMEN: Soft, flat, nontender, nondistended. Has good bowel sounds. No hepatosplenomegaly appreciated.  EXTREMITIES: No evidence of any cyanosis, clubbing, or peripheral edema.  +2 pedal and radial pulses bilaterally.  NEUROLOGIC: The patient is alert, awake, and oriented x3 with no focal motor or sensory deficits appreciated bilaterally.  SKIN: Moist and warm with no rashes appreciated.  Psych: Positive anxious, not depressed LN: No inguinal LN enlargement    Antibiotics   Anti-infectives    Start     Dose/Rate Route Frequency Ordered Stop   12/12/15 1000  levofloxacin (LEVAQUIN) tablet 500 mg     500 mg Oral Daily 12/12/15 0953     12/10/15 1515  cefTRIAXone (ROCEPHIN) 1 g in dextrose 5 % 50 mL IVPB     1 g 100 mL/hr over 30 Minutes Intravenous Daily 12/10/15 1501        Medications   Scheduled Meds: . antiseptic oral rinse  7 mL Mouth Rinse BID  . cefTRIAXone (ROCEPHIN)  IV  1 g Intravenous Daily  . gabapentin  300 mg Oral TID  . insulin aspart  0-9 Units Subcutaneous Q4H  . ipratropium-albuterol  3 mL Nebulization Q6H  . levofloxacin  500 mg Oral Daily  . methylPREDNISolone (SOLU-MEDROL) injection  40 mg Intravenous Q6H  . mometasone-formoterol  2 puff Inhalation BID  . nicotine  21 mg Transdermal Daily  . nitroGLYCERIN      .  pantoprazole (PROTONIX) IVPB  80 mg Intravenous BID  . sodium chloride flush  3 mL Intravenous Q12H  . venlafaxine XR  150 mg Oral Q breakfast   Continuous Infusions:   PRN Meds:.sodium chloride, sodium chloride, HYDROcodone-acetaminophen, hydrOXYzine, nitroGLYCERIN, ondansetron (ZOFRAN) IV, senna-docusate, sodium chloride flush   Data Review:   Micro Results Recent Results (from the past 240 hour(s))  MRSA PCR Screening     Status: None   Collection Time: 12/10/15  3:39 PM  Result Value Ref Range Status   MRSA by PCR NEGATIVE NEGATIVE Final    Comment:        The GeneXpert MRSA Assay (FDA approved for NASAL specimens only), is one component of a comprehensive MRSA colonization surveillance program. It is not intended to diagnose MRSA infection nor to guide or monitor treatment for MRSA infections.   Urine culture     Status: Abnormal   Collection Time: 12/10/15  9:15 PM  Result Value Ref Range Status   Specimen Description URINE, RANDOM  Final   Special Requests NONE  Final   Culture MULTIPLE SPECIES PRESENT, SUGGEST RECOLLECTION (A)  Final   Report Status 12/12/2015 FINAL  Final    Radiology Reports Dg Abdomen 1 View  Result Date: 12/10/2015 CLINICAL DATA:  Upper abdominal pain. Bloody emesis. Screened for obstruction EXAM: ABDOMEN - 1 VIEW COMPARISON:  CT 07/21/2010 FINDINGS: Surgical clips are noted in the right upper quadrant m left upper quadrant. Status post lumbar fusion. Bowel gas pattern is nonobstructive. No evidence for organomegaly. No abnormal calcifications. IMPRESSION: Nonobstructive bowel gas pattern.  Postoperative changes. Electronically Signed   By: Norva Pavlov M.D.   On: 12/10/2015 14:54   Dg Chest Port 1 View  Result Date: 12/11/2015 CLINICAL DATA:  Interval change EXAM: PORTABLE CHEST 1 VIEW COMPARISON:  12/10/2015 FINDINGS: The  heart size and mediastinal contours are within normal limits. Both lungs are clear. The visualized skeletal structures  are unremarkable. IMPRESSION: No active disease. Electronically Signed   By: Marlan Palauharles  Clark M.D.   On: 12/11/2015 07:59   Dg Chest Portable 1 View  Result Date: 12/10/2015 CLINICAL DATA:  55 year old female with a history of difficulty breathing EXAM: PORTABLE CHEST 1 VIEW COMPARISON:  11/12/2015, 08/11/2015, CT 05/24/2015 FINDINGS: Cardiomediastinal silhouette unchanged. Surgical changes of the cervical region. Low lung volumes. No confluent airspace disease, pneumothorax, or pleural effusion. No displaced fracture. IMPRESSION: No radiographic evidence of acute cardiopulmonary disease. Signed, Yvone NeuJaime S. Loreta AveWagner, DO [facility] Electronically Signed   By: Gilmer MorJaime  Wagner D.O.   On: 12/10/2015 14:03     CBC  Recent Labs Lab 12/10/15 1329 12/11/15 0322 12/13/15 0508  WBC 12.6* 11.2* 6.0  HGB 11.7* 10.4* 9.1*  HCT 35.0 29.9* 26.0*  PLT 155 152 99*  MCV 83.7 81.8 83.3  MCH 28.0 28.4 29.0  MCHC 33.4 34.7 34.8  RDW 16.4* 16.1* 16.4*    Chemistries   Recent Labs Lab 12/10/15 1329 12/11/15 0322 12/11/15 0336 12/13/15 0508  NA 137 139  --  136  K 4.1 3.6  --  3.9  CL 111 110  --  109  CO2 18* 22  --  20*  GLUCOSE 212* 104*  --  174*  BUN 29* 27*  --  14  CREATININE 0.57 0.82  --  0.73  CALCIUM 8.4* 7.9*  --  7.9*  MG  --   --  2.1  --   AST 78*  --   --   --   ALT 60*  --   --   --   ALKPHOS 129*  --   --   --   BILITOT 1.1  --   --   --    ------------------------------------------------------------------------------------------------------------------ estimated creatinine clearance is 78.3 mL/min (by C-G formula based on SCr of 0.8 mg/dL). ------------------------------------------------------------------------------------------------------------------ No results for input(s): HGBA1C in the last 72  hours. ------------------------------------------------------------------------------------------------------------------ No results for input(s): CHOL, HDL, LDLCALC, TRIG, CHOLHDL, LDLDIRECT in the last 72 hours. ------------------------------------------------------------------------------------------------------------------ No results for input(s): TSH, T4TOTAL, T3FREE, THYROIDAB in the last 72 hours.  Invalid input(s): FREET3 ------------------------------------------------------------------------------------------------------------------ No results for input(s): VITAMINB12, FOLATE, FERRITIN, TIBC, IRON, RETICCTPCT in the last 72 hours.  Coagulation profile  Recent Labs Lab 12/10/15 1329 12/11/15 0706  INR 1.34 1.23    No results for input(s): DDIMER in the last 72 hours.  Cardiac Enzymes  Recent Labs Lab 12/10/15 1329  TROPONINI 0.03*   ------------------------------------------------------------------------------------------------------------------ Invalid input(s): POCBNP    Assessment & Plan   Patient is a 55 year old white female with history of polysubstance abuse, hepatitis C, liver cirrhosis, COPD, type 2 diabetes, coronary artery disease status post stent placement on Plavix and CHF presented with upper GI bleed  1. Upper GI bleed due to a single minor bleeding GU ectasia status post clip placement Appreciate GI input Hemoglobin stable no further signs of bleeding Protonix twice a day changed to oral Avoid NSAIDs Plavix on hold for 3 days total since EGD if no bleeding  2. Shortness of breath with coughing Acute bronchitis with COPD flare Continues to complain of shortness of breath chest pain I will get a CT per PE to rule out PE   3. Diabetes type 2 Continue sliding scale insulin  4. Coronary artery disease with previous stent- complains of chest pain I will order cardiac  enzymes have her cardiologist see the patient order an EKG Plavix being held  resume in 2 days more  5. Liver cirrhosis and hepatitis C     Code Status Orders        Start     Ordered   12/10/15 1454  Full code  Continuous     12/10/15 1455    Code Status History    Date Active Date Inactive Code Status Order ID Comments User Context   11/11/2015  6:38 PM 11/12/2015  9:01 PM Full Code [id number]  Alford Highland, MD ED           Consults  GI   DVT Prophylaxis SCD's  Lab Results  Component Value Date   PLT 99 (L) 12/13/2015     Time Spent in minutes   Greater than 50% of time spent in care coordination and counseling patient regarding the condition and plan of care.   Auburn Bilberry M.D on 12/13/2015 at 10:32 AM  Between 7am to 6pm - Pager - [phone]  After 6pm go to [email or web address] - password [email or web address] [facility]  [facility]   Office  [phone]

## 2015-12-13 NOTE — Progress Notes (Signed)
Pt complains of chest pain 10/10 mid sternum.  Spoke with Dr Sheryle Hailiamond and order for nitrostat.  Gave 2 doses to pt 5 minutes apart and chest pain resolved.  Pt given atarax for anxiety.  Pt states she is better able to relax but complains of headache now.  Recommended pt close her eyes and turned lights down and tv off. Henriette CombsSarah Divine Imber RN

## 2015-12-13 NOTE — Plan of Care (Signed)
Problem: Pain Managment: Goal: General experience of comfort will improve Outcome: Progressing Hydrocodone-acetaminophen 5-325 MG times 1 (one) tablet given. Improvement noted.  Problem: Physical Regulation: Goal: Will remain free from infection Outcome: Progressing Remains on IV Antibiotics. WBC's Improving.

## 2015-12-13 NOTE — Care Management Important Message (Signed)
Important Message  Patient Details  Name: Lonny PrudeSherry N Dearinger MRN: [id number] Date of Birth: 04-13-1961   Medicare Important Message Given:  Yes    Gwenette GreetBrenda S Kydan Shanholtzer, RN 12/13/2015, 9:03 AM
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
## 2015-12-13 NOTE — Consult Note (Signed)
[facility] Consult   Reason for Consult:  Consult for 55 year old woman with a history of depression Referring Physician:  Posey Pronto Patient Identification: Meghan Jimenez MRN:  [id number] Principal Diagnosis: Posttraumatic stress disorder Diagnosis:   Patient Active Problem List   Diagnosis Date Noted  . PTSD (post-traumatic stress disorder) [F43.10] 12/13/2015  . Dysthymia [F34.1] 12/13/2015  . GIB (gastrointestinal bleeding) [K92.2] 12/10/2015  . Unstable angina ([place]) [I20.0] 11/11/2015    Total Time spent with patient: 1 hour  Subjective:   Meghan Jimenez is a 55 y.o. female patient admitted with "I've just been through a lot".  HPI:  Patient interviewed. Chart reviewed. 55 year old woman with multiple chronic and acute medical problems. Concern about depression and anxiety. Patient states that her mood feels down and nervous much of the time. She is especially feeling depressed the last couple days because she feels like her medical problems have gotten out of hand. She reports chronic sleep problems with difficulty maintaining sleep. She has had some decrease in appetite. Patient talks about how she has had a lot of chronic suffering. She starts her problems with the death of her husband by suicide 17 years ago. Worries a lot about her daughter and other family members. Worries about her own health. Feels weak much of the time. Denies any actual suicidal ideation. She talks about how she can see dead people mostly people in her life who have died. She does not seem to be disturbed by this. She says that she has not been drinking or using any drugs because she gave up crack cocaine several weeks ago. She does take medication prescribed by her outpatient provider for depression.  Social history: Lives with her adult son. Does not work. Little activity during the day. Lots of stress related to family problems.  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 She minimizes and avoids talking about alcohol abuse. Denies recent drug abuse. She denies that she was abusing any prescribed medications.  Medical history: Cirrhosis congestive heart failure and diabetes multiple medical problems. Chronic pain.  Past Psychiatric History: Patient says she has had 1 previous hospitalization psychiatrically and 1 previous suicide attempt it was many years ago. Has been seen by psychiatrist chronically. Used to see Dr. Kasandra Knudsen in [place] until she could no longer afford it. Used to take chronic benzodiazepines but has not done that since she cannot see Dr. Kasandra Knudsen anymore. Currently takes Effexor 150 mg a day seems to get some benefit from it.  Risk to Self: Is patient at risk for suicide?: No Risk to Others:   Prior Inpatient Therapy:   Prior Outpatient Therapy:    Past Medical History:  Past Medical History:  Diagnosis Date  . CHF (congestive heart failure) ([place])   . Cirrhosis ([place])   . COPD (chronic obstructive pulmonary disease) ([place])   . Diabetes mellitus without complication ([place])   . Hepatitis C   . Hypertension     Past Surgical History:  Procedure Laterality Date  . BACK SURGERY    . CARDIAC SURGERY    . ESOPHAGOGASTRODUODENOSCOPY (EGD) WITH PROPOFOL N/A 12/11/2015   Procedure: ESOPHAGOGASTRODUODENOSCOPY (EGD) WITH PROPOFOL;  Surgeon: San Jetty, MD;  Location: [facility];  Service: Gastroenterology;  Laterality: N/A;   Family History:  Family History  Problem Relation Age of Onset  . CAD Mother   . Diabetes Mother   . Lung cancer Father    Family Psychiatric  History: Family history positive for  multiple people with bipolar disorder and depression. Social History:  History  Alcohol Use  . Yes    Comment: Drinks occasionally every week     History  Drug Use  . Types: "Crack" cocaine    Social History   Social History  . Marital status: Widowed    Spouse name: N/A  . Number of children: N/A  . Years of education:  N/A   Social History Main Topics  . Smoking status: Current Every Day Smoker    Packs/day: 1.00    Types: Cigarettes  . Smokeless tobacco: Never Used     Comment: SMOKES 3-4 PKS/DAY  . Alcohol use Yes     Comment: Drinks occasionally every week  . Drug use:     Types: "Crack" cocaine  . Sexual activity: No   Other Topics Concern  . None   Social History Narrative  . None   Additional Social History:    Allergies:   Allergies  Allergen Reactions  . Aspirin Anaphylaxis and Palpitations    Patient takes ibuprofen as home med, so no problem with NSAIDs  . Tramadol Anaphylaxis  . Latex   . Vicodin [Hydrocodone-Acetaminophen] Palpitations    Labs:  Results for orders placed or performed during the hospital encounter of 12/10/15 (from the past 48 hour(s))  Glucose, capillary     Status: Abnormal   Collection Time: 12/11/15  7:52 PM  Result Value Ref Range   Glucose-Capillary 166 (H) 65 - 99 mg/dL  Glucose, capillary     Status: Abnormal   Collection Time: 12/11/15 11:47 PM  Result Value Ref Range   Glucose-Capillary 130 (H) 65 - 99 mg/dL   Comment 1 Notify RN   Glucose, capillary     Status: Abnormal   Collection Time: 12/12/15  1:48 AM  Result Value Ref Range   Glucose-Capillary 132 (H) 65 - 99 mg/dL   Comment 1 Notify RN   Glucose, capillary     Status: Abnormal   Collection Time: 12/12/15  3:54 AM  Result Value Ref Range   Glucose-Capillary 111 (H) 65 - 99 mg/dL   Comment 1 Notify RN   Glucose, capillary     Status: Abnormal   Collection Time: 12/12/15  7:48 AM  Result Value Ref Range   Glucose-Capillary 100 (H) 65 - 99 mg/dL   Comment 1 Notify RN    Comment 2 Document in Chart   Glucose, capillary     Status: Abnormal   Collection Time: 12/12/15 12:08 PM  Result Value Ref Range   Glucose-Capillary 204 (H) 65 - 99 mg/dL   Comment 1 Notify RN    Comment 2 Document in Chart   Glucose, capillary     Status: Abnormal   Collection Time: 12/12/15  3:48 PM   Result Value Ref Range   Glucose-Capillary 114 (H) 65 - 99 mg/dL  Glucose, capillary     Status: Abnormal   Collection Time: 12/12/15  7:37 PM  Result Value Ref Range   Glucose-Capillary 137 (H) 65 - 99 mg/dL  Glucose, capillary     Status: Abnormal   Collection Time: 12/12/15  8:05 PM  Result Value Ref Range   Glucose-Capillary 189 (H) 65 - 99 mg/dL  Glucose, capillary     Status: Abnormal   Collection Time: 12/12/15 11:25 PM  Result Value Ref Range   Glucose-Capillary 223 (H) 65 - 99 mg/dL  Glucose, capillary     Status: Abnormal   Collection Time: 12/13/15 12:33 AM  Result Value Ref Range   Glucose-Capillary 199 (H) 65 - 99 mg/dL  Glucose, capillary     Status: Abnormal   Collection Time: 12/13/15  3:36 AM  Result Value Ref Range   Glucose-Capillary 161 (H) 65 - 99 mg/dL  Procalcitonin     Status: None   Collection Time: 12/13/15  5:08 AM  Result Value Ref Range   Procalcitonin <0.10 ng/mL    Comment:        Interpretation: PCT (Procalcitonin) <= 0.5 ng/mL: Systemic infection (sepsis) is not likely. Local bacterial infection is possible. (NOTE)         ICU PCT Algorithm               Non ICU PCT Algorithm    ----------------------------     ------------------------------         PCT < 0.25 ng/mL                 PCT < 0.1 ng/mL     Stopping of antibiotics            Stopping of antibiotics       strongly encouraged.               strongly encouraged.    ----------------------------     ------------------------------       PCT level decrease by               PCT < 0.25 ng/mL       >= 80% from peak PCT       OR PCT 0.25 - 0.5 ng/mL          Stopping of antibiotics                                             encouraged.     Stopping of antibiotics           encouraged.    ----------------------------     ------------------------------       PCT level decrease by              PCT >= 0.25 ng/mL       < 80% from peak PCT        AND PCT >= 0.5 ng/mL            Continuin g  antibiotics                                              encouraged.       Continuing antibiotics            encouraged.    ----------------------------     ------------------------------     PCT level increase compared          PCT > 0.5 ng/mL         with peak PCT AND          PCT >= 0.5 ng/mL             Escalation of antibiotics  strongly encouraged.      Escalation of antibiotics        strongly encouraged.   CBC     Status: Abnormal   Collection Time: 12/13/15  5:08 AM  Result Value Ref Range   WBC 6.0 3.6 - 11.0 K/uL   RBC 3.12 (L) 3.80 - 5.20 MIL/uL   Hemoglobin 9.1 (L) 12.0 - 16.0 g/dL   HCT 26.0 (L) 35.0 - 47.0 %   MCV 83.3 80.0 - 100.0 fL   MCH 29.0 26.0 - 34.0 pg   MCHC 34.8 32.0 - 36.0 g/dL   RDW 16.4 (H) 11.5 - 14.5 %   Platelets 99 (L) 150 - 440 K/uL  Basic metabolic panel     Status: Abnormal   Collection Time: 12/13/15  5:08 AM  Result Value Ref Range   Sodium 136 135 - 145 mmol/L   Potassium 3.9 3.5 - 5.1 mmol/L   Chloride 109 101 - 111 mmol/L   CO2 20 (L) 22 - 32 mmol/L   Glucose, Bld 174 (H) 65 - 99 mg/dL   BUN 14 6 - 20 mg/dL   Creatinine, Ser 0.73 0.44 - 1.00 mg/dL   Calcium 7.9 (L) 8.9 - 10.3 mg/dL   GFR calc non Af Amer >60 >60 mL/min   GFR calc Af Amer >60 >60 mL/min    Comment: (NOTE) The eGFR has been calculated using the CKD EPI equation. This calculation has not been validated in all clinical situations. eGFR's persistently <60 mL/min signify possible Chronic Kidney Disease.    Anion gap 7 5 - 15  Glucose, capillary     Status: Abnormal   Collection Time: 12/13/15  7:35 AM  Result Value Ref Range   Glucose-Capillary 223 (H) 65 - 99 mg/dL  Troponin I (q 6hr x 3)     Status: None   Collection Time: 12/13/15 10:12 AM  Result Value Ref Range   Troponin I <0.03 <0.03 ng/mL  Glucose, capillary     Status: Abnormal   Collection Time: 12/13/15 12:32 PM  Result Value Ref Range   Glucose-Capillary 350  (H) 65 - 99 mg/dL  Glucose, capillary     Status: Abnormal   Collection Time: 12/13/15  4:48 PM  Result Value Ref Range   Glucose-Capillary 144 (H) 65 - 99 mg/dL    Current Facility-Administered Medications  Medication Dose Route Frequency Provider Last Rate Last Dose  . 0.9 %  sodium chloride infusion  250 mL Intravenous PRN Flora Lipps, MD   Stopped at 12/13/15 0344  . 0.9 %  sodium chloride infusion  250 mL Intravenous PRN Flora Lipps, MD   Stopped at 12/12/15 1950  . acetaminophen (TYLENOL) tablet 650 mg  650 mg Oral Q6H PRN Dustin Flock, MD      . antiseptic oral rinse (CPC / CETYLPYRIDINIUM CHLORIDE 0.05%) solution 7 mL  7 mL Mouth Rinse BID Flora Lipps, MD   7 mL at 12/13/15 1056  . cefTRIAXone (ROCEPHIN) 1 g in dextrose 5 % 50 mL IVPB  1 g Intravenous Daily Ramond Dial, RPH   1 g at 12/13/15 1056  . gabapentin (NEURONTIN) capsule 300 mg  300 mg Oral TID Mikael Spray, NP   300 mg at 12/13/15 1057  . HYDROcodone-acetaminophen (NORCO/VICODIN) 5-325 MG per tablet 1 tablet  1 tablet Oral Q4H PRN Dustin Flock, MD      . hydrOXYzine (ATARAX/VISTARIL) tablet 10 mg  10 mg Oral TID PRN Mikael Spray, NP  10 mg at 12/13/15 1055  . insulin aspart (novoLOG) injection 0-5 Units  0-5 Units Subcutaneous QHS Dustin Flock, MD      . insulin aspart (novoLOG) injection 0-9 Units  0-9 Units Subcutaneous TID WC Dustin Flock, MD   7 Units at 12/13/15 1200  . ipratropium-albuterol (DUONEB) 0.5-2.5 (3) MG/3ML nebulizer solution 3 mL  3 mL Nebulization Q6H Dustin Flock, MD   3 mL at 12/13/15 1400  . levofloxacin (LEVAQUIN) tablet 500 mg  500 mg Oral Daily Dustin Flock, MD   500 mg at 12/13/15 1055  . methylPREDNISolone sodium succinate (SOLU-MEDROL) 40 mg/mL injection 40 mg  40 mg Intravenous Q6H Dustin Flock, MD   40 mg at 12/13/15 1057  . mometasone-formoterol (DULERA) 100-5 MCG/ACT inhaler 2 puff  2 puff Inhalation BID Dustin Flock, MD   2 puff at 12/13/15 7209  . nicotine  (NICODERM CQ - dosed in mg/24 hours) patch 21 mg  21 mg Transdermal Daily Mikael Spray, NP   21 mg at 12/13/15 1057  . nitroGLYCERIN (NITROSTAT) SL tablet 0.4 mg  0.4 mg Sublingual Q5 min PRN Harrie Foreman, MD   0.4 mg at 12/13/15 0536  . ondansetron (ZOFRAN) injection 4 mg  4 mg Intravenous Q6H PRN Flora Lipps, MD      . pantoprazole (PROTONIX) 80 mg in sodium chloride 0.9 % 100 mL IVPB  80 mg Intravenous BID Flora Lipps, MD   80 mg at 12/13/15 0800  . senna-docusate (Senokot-S) tablet 2 tablet  2 tablet Oral QHS PRN Mikael Spray, NP      . sodium chloride flush (NS) 0.9 % injection 3 mL  3 mL Intravenous Q12H Flora Lipps, MD   3 mL at 12/13/15 1058  . sodium chloride flush (NS) 0.9 % injection 3 mL  3 mL Intravenous PRN Flora Lipps, MD      . traZODone (DESYREL) tablet 100 mg  100 mg Oral QHS Gonzella Lex, MD      . venlafaxine XR (EFFEXOR-XR) 24 hr capsule 150 mg  150 mg Oral Q breakfast Mikael Spray, NP   150 mg at 12/13/15 0831    Musculoskeletal: Strength & Muscle Tone: decreased Gait & Station: unable to stand Patient leans: N/A  Psychiatric Specialty Exam: Physical Exam  Nursing note and vitals reviewed. Constitutional: She appears well-developed and well-nourished.  HENT:  Head: Normocephalic and atraumatic.  Eyes: Conjunctivae are normal. Pupils are equal, round, and reactive to light.  Neck: Normal range of motion.  Cardiovascular: Normal heart sounds.   Respiratory: Effort normal. No respiratory distress.  GI: Soft.  Musculoskeletal: Normal range of motion.  Neurological: She is alert.  Skin: Skin is warm and dry.  Psychiatric: Her speech is normal and behavior is normal. Judgment and thought content normal. Her mood appears anxious. She exhibits a depressed mood.    Review of Systems  Constitutional: Positive for malaise/fatigue.  HENT: Negative.   Eyes: Negative.   Respiratory: Negative.   Cardiovascular: Negative.   Gastrointestinal:  Negative.   Musculoskeletal: Negative.   Skin: Negative.   Neurological: Positive for weakness.  Psychiatric/Behavioral: Positive for depression, hallucinations, memory loss and substance abuse. Negative for suicidal ideas. The patient is nervous/anxious and has insomnia.     Blood pressure (!) 147/62, pulse (!) 113, temperature 98.3 F (36.8 C), resp. rate (!) 24, height _0  (1.549 m), weight 88.1 kg (194 lb 2 oz), SpO2 97 %.Body mass index is 36.68 kg/m.  General Appearance: Casual  Eye Contact:  Fair  Speech:  Slow  Volume:  Decreased  Mood:  Depressed  Affect:  Blunt and Congruent  Thought Process:  Coherent  Orientation:  Full (Time, Place, and Person)  Thought Content:  Tangential  Suicidal Thoughts:  No  Homicidal Thoughts:  No  Memory:  Immediate;   Fair Recent;   Fair Remote;   Fair  Judgement:  Fair  Insight:  Fair  Psychomotor Activity:  Decreased  Concentration:  Concentration: Fair  Recall:  [employer] of Knowledge:  Fair  Language:  Fair  Akathisia:  No  Handed:  Right  AIMS (if indicated):     Assets:  Desire for Improvement Housing  ADL's:  Impaired  Cognition:  WNL  Sleep:        Treatment Plan Summary: Medication management and Plan 55 year old woman with chronic depression and low mood anxiety results from chronic stress. Patient is not psychotic. Has no suicidal ideation. Has some positive things in her life and hope for the future. I am not going to change her current Effexor. Changes to that are unlikely to have much benefit. Encourage patient to stay active and she should probably think about getting in to see a therapist. She did seem to possibly need some medicine to help her sleep better at night. I will add trazodone 100 mg at night for sleep. Follow-up as needed.  Disposition: Supportive therapy provided about ongoing stressors.  Alethia Berthold, MD 12/13/2015 7:01 PM

## 2015-12-13 NOTE — Consult Note (Signed)
Reason for Consult: Coronary disease altered mental status preop for GI procedure Referring Physician: Dr. Fritzi Mandes hospitalist  Meghan Jimenez is an 55 y.o. female.  HPI: Patient presents with multiple medical problems including hepatitis C cirrhosis COPD diabetes coronary artery disease PCI and stent on Plavix congestive heart failure smoking complains of acute gastroenteritis symptoms of vomiting dark red blood clots confusion and weakness fatigue altered mental status tachycardia. Patient's had vague chest pain symptoms denies previous GI problems. Cirrhosis is not clear if she has portal hypertension. Cardiology was recommended because of previous cardiac history includes coronary disease PCI and stent congestive heart failure. Patient states to be evaluated by GI including possible endoscopy  Past Medical History:  Diagnosis Date  . CHF (congestive heart failure) ([place])   . Cirrhosis ([place])   . COPD (chronic obstructive pulmonary disease) ([place])   . Diabetes mellitus without complication ([place])   . Hepatitis C   . Hypertension     Past Surgical History:  Procedure Laterality Date  . BACK SURGERY    . CARDIAC SURGERY    . ESOPHAGOGASTRODUODENOSCOPY (EGD) WITH PROPOFOL N/A 12/11/2015   Procedure: ESOPHAGOGASTRODUODENOSCOPY (EGD) WITH PROPOFOL;  Surgeon: San Jetty, MD;  Location: [facility] ENDOSCOPY;  Service: Gastroenterology;  Laterality: N/A;    Family History  Problem Relation Age of Onset  . CAD Mother   . Diabetes Mother   . Lung cancer Father     Social History:  reports that she has been smoking Cigarettes.  She has been smoking about 1.00 pack per day. She has never used smokeless tobacco. She reports that she drinks alcohol. She reports that she uses drugs, including "Crack" cocaine.  Allergies:  Allergies  Allergen Reactions  . Aspirin Anaphylaxis and Palpitations    Patient takes ibuprofen as home med, so no problem with NSAIDs  . Tramadol Anaphylaxis  . Latex   .  Vicodin [Hydrocodone-Acetaminophen] Palpitations    Medications: I have reviewed the patient's current medications.  Results for orders placed or performed during the hospital encounter of 12/10/15 (from the past 48 hour(s))  Glucose, capillary     Status: Abnormal   Collection Time: 12/11/15  1:31 PM  Result Value Ref Range   Glucose-Capillary 148 (H) 65 - 99 mg/dL  Glucose, capillary     Status: Abnormal   Collection Time: 12/11/15  3:19 PM  Result Value Ref Range   Glucose-Capillary 150 (H) 65 - 99 mg/dL  Glucose, capillary     Status: Abnormal   Collection Time: 12/11/15  4:50 PM  Result Value Ref Range   Glucose-Capillary 203 (H) 65 - 99 mg/dL  Glucose, capillary     Status: Abnormal   Collection Time: 12/11/15  7:52 PM  Result Value Ref Range   Glucose-Capillary 166 (H) 65 - 99 mg/dL  Glucose, capillary     Status: Abnormal   Collection Time: 12/11/15 11:47 PM  Result Value Ref Range   Glucose-Capillary 130 (H) 65 - 99 mg/dL   Comment 1 Notify RN   Glucose, capillary     Status: Abnormal   Collection Time: 12/12/15  1:48 AM  Result Value Ref Range   Glucose-Capillary 132 (H) 65 - 99 mg/dL   Comment 1 Notify RN   Glucose, capillary     Status: Abnormal   Collection Time: 12/12/15  3:54 AM  Result Value Ref Range   Glucose-Capillary 111 (H) 65 - 99 mg/dL   Comment 1 Notify RN   Glucose, capillary  Status: Abnormal   Collection Time: 12/12/15  7:48 AM  Result Value Ref Range   Glucose-Capillary 100 (H) 65 - 99 mg/dL   Comment 1 Notify RN    Comment 2 Document in Chart   Glucose, capillary     Status: Abnormal   Collection Time: 12/12/15 12:08 PM  Result Value Ref Range   Glucose-Capillary 204 (H) 65 - 99 mg/dL   Comment 1 Notify RN    Comment 2 Document in Chart   Glucose, capillary     Status: Abnormal   Collection Time: 12/12/15  3:48 PM  Result Value Ref Range   Glucose-Capillary 114 (H) 65 - 99 mg/dL  Glucose, capillary     Status: Abnormal   Collection  Time: 12/12/15  7:37 PM  Result Value Ref Range   Glucose-Capillary 137 (H) 65 - 99 mg/dL  Glucose, capillary     Status: Abnormal   Collection Time: 12/12/15  8:05 PM  Result Value Ref Range   Glucose-Capillary 189 (H) 65 - 99 mg/dL  Glucose, capillary     Status: Abnormal   Collection Time: 12/12/15 11:25 PM  Result Value Ref Range   Glucose-Capillary 223 (H) 65 - 99 mg/dL  Glucose, capillary     Status: Abnormal   Collection Time: 12/13/15 12:33 AM  Result Value Ref Range   Glucose-Capillary 199 (H) 65 - 99 mg/dL  Glucose, capillary     Status: Abnormal   Collection Time: 12/13/15  3:36 AM  Result Value Ref Range   Glucose-Capillary 161 (H) 65 - 99 mg/dL  Procalcitonin     Status: None   Collection Time: 12/13/15  5:08 AM  Result Value Ref Range   Procalcitonin <0.10 ng/mL    Comment:        Interpretation: PCT (Procalcitonin) <= 0.5 ng/mL: Systemic infection (sepsis) is not likely. Local bacterial infection is possible. (NOTE)         ICU PCT Algorithm               Non ICU PCT Algorithm    ----------------------------     ------------------------------         PCT < 0.25 ng/mL                 PCT < 0.1 ng/mL     Stopping of antibiotics            Stopping of antibiotics       strongly encouraged.               strongly encouraged.    ----------------------------     ------------------------------       PCT level decrease by               PCT < 0.25 ng/mL       >= 80% from peak PCT       OR PCT 0.25 - 0.5 ng/mL          Stopping of antibiotics                                             encouraged.     Stopping of antibiotics           encouraged.    ----------------------------     ------------------------------       PCT level decrease by  PCT >= 0.25 ng/mL       < 80% from peak PCT        AND PCT >= 0.5 ng/mL            Continuin g antibiotics                                              encouraged.       Continuing antibiotics             encouraged.    ----------------------------     ------------------------------     PCT level increase compared          PCT > 0.5 ng/mL         with peak PCT AND          PCT >= 0.5 ng/mL             Escalation of antibiotics                                          strongly encouraged.      Escalation of antibiotics        strongly encouraged.   CBC     Status: Abnormal   Collection Time: 12/13/15  5:08 AM  Result Value Ref Range   WBC 6.0 3.6 - 11.0 K/uL   RBC 3.12 (L) 3.80 - 5.20 MIL/uL   Hemoglobin 9.1 (L) 12.0 - 16.0 g/dL   HCT 26.0 (L) 35.0 - 47.0 %   MCV 83.3 80.0 - 100.0 fL   MCH 29.0 26.0 - 34.0 pg   MCHC 34.8 32.0 - 36.0 g/dL   RDW 16.4 (H) 11.5 - 14.5 %   Platelets 99 (L) 150 - 440 K/uL  Basic metabolic panel     Status: Abnormal   Collection Time: 12/13/15  5:08 AM  Result Value Ref Range   Sodium 136 135 - 145 mmol/L   Potassium 3.9 3.5 - 5.1 mmol/L   Chloride 109 101 - 111 mmol/L   CO2 20 (L) 22 - 32 mmol/L   Glucose, Bld 174 (H) 65 - 99 mg/dL   BUN 14 6 - 20 mg/dL   Creatinine, Ser 0.73 0.44 - 1.00 mg/dL   Calcium 7.9 (L) 8.9 - 10.3 mg/dL   GFR calc non Af Amer >60 >60 mL/min   GFR calc Af Amer >60 >60 mL/min    Comment: (NOTE) The eGFR has been calculated using the CKD EPI equation. This calculation has not been validated in all clinical situations. eGFR's persistently <60 mL/min signify possible Chronic Kidney Disease.    Anion gap 7 5 - 15  Glucose, capillary     Status: Abnormal   Collection Time: 12/13/15  7:35 AM  Result Value Ref Range   Glucose-Capillary 223 (H) 65 - 99 mg/dL  Troponin I (q 6hr x 3)     Status: None   Collection Time: 12/13/15 10:12 AM  Result Value Ref Range   Troponin I <0.03 <0.03 ng/mL  Glucose, capillary     Status: Abnormal   Collection Time: 12/13/15 12:32 PM  Result Value Ref Range   Glucose-Capillary 350 (H) 65 - 99 mg/dL    Ct Angio Chest Pe W Or Wo Contrast  Result Date: 12/13/2015 CLINICAL  DATA:  Shortness of  breath and chest pain EXAM: CT ANGIOGRAPHY CHEST WITH CONTRAST TECHNIQUE: Multidetector CT imaging of the chest was performed using the standard protocol during bolus administration of intravenous contrast. Multiplanar CT image reconstructions and MIPs were obtained to evaluate the vascular anatomy. CONTRAST:  75 mL Isovue 370. COMPARISON:  12/11/2015 FINDINGS: Mediastinum/Lymph Nodes: The thoracic inlet is within normal limits. No significant hilar or mediastinal adenopathy is noted. Cardiovascular: The thoracic aorta shows a variant branching pattern with left vertebral artery origination directly from the aorta. No aneurysmal dilatation or dissection is identified. The pulmonary artery is well visualized within normal branching pattern. No filling defects to suggest pulmonary emboli are noted. Lungs/Pleura: The lungs are well aerated bilaterally. Minimal bilateral lower lobe atelectasis is noted left slightly greater than right. Upper abdomen: Some nodularity to the liver is noted consistent with underlying cirrhotic change. The gallbladder has been surgically removed. No other focal abnormality is seen. Musculoskeletal: Degenerative changes of the thoracic spine are again noted. Review of the MIP images confirms the above findings. IMPRESSION: No evidence of pulmonary emboli. Mild bilateral lower lobe atelectasis. Changes consistent with cirrhosis of the liver. Electronically Signed   By: Inez Catalina M.D.   On: 12/13/2015 10:49    Review of Systems  Constitutional: Positive for diaphoresis, fever and malaise/fatigue.  HENT: Positive for congestion.   Eyes: Negative.   Respiratory: Positive for shortness of breath.   Cardiovascular: Positive for chest pain, palpitations and leg swelling.  Gastrointestinal: Positive for abdominal pain, blood in stool, diarrhea, heartburn, nausea and vomiting.  Genitourinary: Negative.   Musculoskeletal: Positive for back pain and myalgias.  Skin: Negative.    Neurological: Positive for dizziness, weakness and headaches.  Endo/Heme/Allergies: Negative.   Psychiatric/Behavioral: Positive for depression and substance abuse. The patient is nervous/anxious.    Blood pressure (!) 147/62, pulse (!) 113, temperature 98.3 F (36.8 C), resp. rate (!) 24, height 5' 1" (1.549 m), weight 88.1 kg (194 lb 2 oz), SpO2 97 %. Physical Exam  Nursing note and vitals reviewed. Constitutional: She is oriented to person, place, and time. She appears well-developed and well-nourished.  HENT:  Head: Normocephalic and atraumatic.  Eyes: Conjunctivae and EOM are normal. Pupils are equal, round, and reactive to light.  Neck: Normal range of motion. Neck supple.  Cardiovascular: Normal rate and regular rhythm.   Murmur heard. Respiratory: Effort normal and breath sounds normal.  GI: Soft. Bowel sounds are normal.  Musculoskeletal: Normal range of motion.  Neurological: She is alert and oriented to person, place, and time. She has normal reflexes.  Skin: Skin is warm and dry.  Psychiatric: She has a normal mood and affect.    Assessment/Plan: Congestive heart failure by history Diabetes type 2 COPD Coronary artery disease PCI and stent Altered mental status Upper GI bleed Acute gastroenteritis with nausea vomiting Hepatitis C Cirrhosis of the liver Substance abuse Chronic pain Anxiety Smoking . PLAN Agree with admit and treat for GI problems Consider gastroenterology for possible EGD Agree with Protonix therapy Follow-up H&H Anemia follow-up and correct as necessary Concerned about portal hypertension with cirrhosis Chronic hepatitis C Altered mental status related to acute illness Advised patient to refrain from tobacco abuse Recommend conservative cardiology care at this point Okay to hold Plavix until bleeding evaluated  CALLWOOD,DWAYNE D. 12/13/2015, 12:47 PM

## 2015-12-14 LAB — GLUCOSE, CAPILLARY
GLUCOSE-CAPILLARY: 303 mg/dL — AB (ref 65–99)
Glucose-Capillary: 150 mg/dL — ABNORMAL HIGH (ref 65–99)

## 2015-12-14 LAB — TROPONIN I: Troponin I: 0.07 ng/mL (ref <0.03)

## 2015-12-14 MED ORDER — PANTOPRAZOLE SODIUM 40 MG PO TBEC: 40.0000 mg | DELAYED_RELEASE_TABLET | Freq: Two times a day (BID) | ORAL | 0 refills | Status: DC

## 2015-12-14 MED ORDER — GABAPENTIN 300 MG PO CAPS: 300.0000 mg | ORAL_CAPSULE | Freq: Three times a day (TID) | ORAL | 0 refills | Status: DC

## 2015-12-14 MED ORDER — TRAZODONE HCL 100 MG PO TABS: 100.0000 mg | ORAL_TABLET | Freq: Every day | ORAL | 0 refills | Status: DC

## 2015-12-14 MED ORDER — DOCUSATE SODIUM 100 MG PO CAPS: 100.0000 mg | ORAL_CAPSULE | Freq: Two times a day (BID) | ORAL | 0 refills | Status: DC

## 2015-12-14 MED ORDER — VENLAFAXINE HCL ER 150 MG PO CP24: 150.0000 mg | ORAL_CAPSULE | Freq: Every day | ORAL | 0 refills | Status: DC

## 2015-12-14 MED ORDER — HYDROXYZINE HCL 10 MG PO TABS: 10.0000 mg | ORAL_TABLET | Freq: Three times a day (TID) | ORAL | 0 refills | Status: DC | PRN

## 2015-12-14 MED ORDER — ASCORBIC ACID 1000 MG PO TABS: 1000.0000 mg | ORAL_TABLET | Freq: Every day | ORAL | 0 refills | Status: DC

## 2015-12-14 NOTE — Discharge Summary (Signed)
Meghan PlentySherry N OdessaHelbert, 55 y.o., DOB January 11, 1961, MRN [id number]. Admission date: 12/10/2015 Discharge Date 12/14/2015 Primary MD [facility] Admitting Physician Erin FullingKurian Kasa, MD  Admission Diagnosis  Upper GI bleeding [K92.2]  Discharge Diagnosis   Active Problems: Upper  GIB (gastrointestinal bleeding) PTSD (post-traumatic stress disorder) Dysthymia Liver cirrhosis Acute shortness of breath due to acute bronchitis Diabetes type 2 Coronary artery disease with previous stents Chest pain during hospitalization noncardiac Depression and anxiety       Hospital Course  Patient is a 55 year old white female who presented to the ED with complaint of bloody emesis and difficulty with breathing. Patient was seen in the ED and was admitted by the intensivist. She was admitted for upper GI bleed and was seen by GI. 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 Patient hemoglobin remained stable. No further evidence of bleeding. Patient complains of chest pain and shortness of breath during hospitalization had a CT per PE which was negative she was seen by a cardiologist who did not feel that this was cardiac related. Patient had some anxiety depression was seen by psychiatry they recommended continuing her current regimen. Patient is doing well and is stable for discharge            Consults  cardiology  Significant Tests:  See full reports for all details     Dg Abdomen 1 View  Result Date: 12/10/2015 CLINICAL DATA:  Upper abdominal pain. Bloody emesis. Screened for obstruction EXAM: ABDOMEN - 1 VIEW COMPARISON:  CT 07/21/2010 FINDINGS: Surgical clips are noted in the right upper quadrant m left upper quadrant. Status post lumbar fusion. Bowel gas pattern is nonobstructive. No evidence for organomegaly. No abnormal calcifications. IMPRESSION: Nonobstructive bowel gas pattern.  Postoperative changes. Electronically  Signed   By: Norva PavlovElizabeth  Jimenez M.D.   On: 12/10/2015 14:54   Ct Angio Chest Pe W Or Wo Contrast  Result Date: 12/13/2015 CLINICAL DATA:  Shortness of breath and chest pain EXAM: CT ANGIOGRAPHY CHEST WITH CONTRAST TECHNIQUE: Multidetector CT imaging of the chest was performed using the standard protocol during bolus administration of intravenous contrast. Multiplanar CT image reconstructions and MIPs were obtained to evaluate the vascular anatomy. CONTRAST:  75 mL Isovue 370. COMPARISON:  12/11/2015 FINDINGS: Mediastinum/Lymph Nodes: The thoracic inlet is within normal limits. No significant hilar or mediastinal adenopathy is noted. Cardiovascular: The thoracic aorta shows a variant branching pattern with left vertebral artery origination directly from the aorta. No aneurysmal dilatation or dissection is identified. The pulmonary artery is well visualized within normal branching pattern. No filling defects to suggest pulmonary emboli are noted. Lungs/Pleura: The lungs are well aerated bilaterally. Minimal bilateral lower lobe atelectasis is noted left slightly greater than right. Upper abdomen: Some nodularity to the liver is noted consistent with underlying cirrhotic change. The gallbladder has been surgically removed. No other focal abnormality is seen. Musculoskeletal: Degenerative changes of the thoracic spine are again noted. Review of the MIP images confirms the above findings. IMPRESSION: No evidence of pulmonary emboli. Mild bilateral lower lobe atelectasis. Changes consistent with cirrhosis of the liver. Electronically Signed   By: Alcide CleverMark  Lukens M.D.   On: 12/13/2015 10:49   Dg Chest Port 1 View  Result Date: 12/11/2015 CLINICAL DATA:  Interval change EXAM: PORTABLE CHEST 1 VIEW COMPARISON:  12/10/2015 FINDINGS: The heart size and mediastinal contours are within normal limits. Both lungs are clear. The visualized skeletal structures are unremarkable. IMPRESSION: No active disease. Electronically Signed  By: Marlan Palauharles  Clark M.D.   On: 12/11/2015 07:59   Dg Chest Portable 1 View  Result Date: 12/10/2015 CLINICAL DATA:  55 year old female with a history of difficulty breathing EXAM: PORTABLE CHEST 1 VIEW COMPARISON:  11/12/2015, 08/11/2015, CT 05/24/2015 FINDINGS: Cardiomediastinal silhouette unchanged. Surgical changes of the cervical region. Low lung volumes. No confluent airspace disease, pneumothorax, or pleural effusion. No displaced fracture. IMPRESSION: No radiographic evidence of acute cardiopulmonary disease. Signed, Yvone NeuJaime S. Loreta AveWagner, DO [facility] Electronically Signed   By: Gilmer MorJaime  Wagner D.O.   On: 12/10/2015 14:03       Today   Subjective:   Meghan Jimenez patient feeling better denies any complaint  Objective:   Blood pressure (!) 107/52, pulse 93, temperature 97.5 F (36.4 C), temperature source Oral, resp. rate 16, height 5\' 1"  (1.549 m), weight 89.6 kg (197 lb 9.6 oz), SpO2 93 %.  .  Intake/Output Summary (Last 24 hours) at 12/14/15 1423 Last data filed at 12/13/15 1900  Gross per 24 hour  Intake              100 ml  Output              550 ml  Net             -450 ml    Exam VITAL SIGNS: Blood pressure (!) 107/52, pulse 93, temperature 97.5 F (36.4 C), temperature source Oral, resp. rate 16, height 5\' 1"  (1.549 m), weight 89.6 kg (197 lb 9.6 oz), SpO2 93 %.  GENERAL:  55 y.o.-year-old patient lying in the bed with no acute distress.  EYES: Pupils equal, round, reactive to light and accommodation. No scleral icterus. Extraocular muscles intact.  HEENT: Head atraumatic, normocephalic. Oropharynx and nasopharynx clear.  NECK:  Supple, no jugular venous distention. No thyroid enlargement, no tenderness.  LUNGS: Normal breath sounds bilaterally, no wheezing, rales,rhonchi or crepitation. No use of accessory muscles of respiration.  CARDIOVASCULAR: S1, S2 normal. No murmurs, rubs, or gallops.  ABDOMEN: Soft,  nontender, nondistended. Bowel sounds present. No organomegaly or mass.  EXTREMITIES: No pedal edema, cyanosis, or clubbing.  NEUROLOGIC: Cranial nerves II through XII are intact. Muscle strength 5/5 in all extremities. Sensation intact. Gait not checked.  PSYCHIATRIC: The patient is alert and oriented x 3.  SKIN: No obvious rash, lesion, or ulcer.   Data Review     CBC w Diff: Lab Results  Component Value Date   WBC 6.0 12/13/2015   HGB 9.1 (L) 12/13/2015   HGB 15.7 01/31/2012   HCT 26.0 (L) 12/13/2015   HCT 45.0 01/31/2012   PLT 99 (L) 12/13/2015   PLT 132 (L) 01/31/2012   LYMPHOPCT 23 11/11/2015   MONOPCT 7 11/11/2015   EOSPCT 3 11/11/2015   BASOPCT 1 11/11/2015   CMP: Lab Results  Component Value Date   NA 136 12/13/2015   NA 140 01/31/2012   K 3.9 12/13/2015   K 3.8 01/31/2012   CL 109 12/13/2015   CL 106 01/31/2012   CO2 20 (L) 12/13/2015   CO2 24 01/31/2012   BUN 14 12/13/2015   BUN 7 01/31/2012   CREATININE 0.73 12/13/2015   CREATININE 0.84 01/31/2012   PROT 6.7 12/10/2015   PROT 8.0 01/31/2012   ALBUMIN 3.0 (L) 12/10/2015   ALBUMIN 3.5 01/31/2012   BILITOT 1.1 12/10/2015   BILITOT 0.4 01/31/2012   ALKPHOS 129 (H) 12/10/2015   ALKPHOS 204 (H) 01/31/2012   AST 78 (H)  12/10/2015   AST 106 (H) 01/31/2012   ALT 60 (H) 12/10/2015   ALT 137 (H) 01/31/2012  .  Micro Results Recent Results (from the past 240 hour(s))  MRSA PCR Screening     Status: None   Collection Time: 12/10/15  3:39 PM  Result Value Ref Range Status   MRSA by PCR NEGATIVE NEGATIVE Final    Comment:        The GeneXpert MRSA Assay (FDA approved for NASAL specimens only), is one component of a comprehensive MRSA colonization surveillance program. It is not intended to diagnose MRSA infection nor to guide or monitor treatment for MRSA infections.   Urine culture     Status: Abnormal   Collection Time: 12/10/15  9:15 PM  Result Value Ref Range Status   Specimen Description  URINE, RANDOM  Final   Special Requests NONE  Final   Culture MULTIPLE SPECIES PRESENT, SUGGEST RECOLLECTION (A)  Final   Report Status 12/12/2015 FINAL  Final        Code Status Orders        Start     Ordered   12/10/15 1454  Full code  Continuous     12/10/15 1455    Code Status History    Date Active Date Inactive Code Status Order ID Comments User Context   11/11/2015  6:38 PM 11/12/2015  9:01 PM Full Code [id number]  Alford Highland, MD ED          Follow-up Information    [facility] Follow up in 7 day(s).   Contact information: [address] [place] [address] [phone]           Discharge Medications     Medication List    STOP taking these medications   ibuprofen 200 MG tablet Commonly known as:  ADVIL,MOTRIN     TAKE these medications   ascorbic acid 1000 MG tablet Commonly known as:  VITAMIN C Take 1 tablet (1,000 mg total) by mouth daily.   clopidogrel 75 MG tablet Commonly known as:  PLAVIX Take 75 mg by mouth daily.   diphenhydrAMINE 50 MG capsule Commonly known as:  BENADRYL Take 50 mg by mouth every 6 (six) hours as needed.   docusate sodium 100 MG capsule Commonly known as:  COLACE Take 1 capsule (100 mg total) by mouth 2 (two) times daily.   gabapentin 300 MG capsule Commonly known as:  NEURONTIN Take 1 capsule (300 mg total) by mouth 3 (three) times daily.   hydrOXYzine 10 MG tablet Commonly known as:  ATARAX/VISTARIL Take 1 tablet (10 mg total) by mouth 3 (three) times daily as needed.   pantoprazole 40 MG tablet Commonly known as:  PROTONIX Take 1 tablet (40 mg total) by mouth 2 (two) times daily.   traZODone 100 MG tablet Commonly known as:  DESYREL Take 1 tablet (100 mg total) by mouth at bedtime.   venlafaxine XR 150 MG 24 hr capsule Commonly known as:  EFFEXOR-XR Take 1 capsule (150 mg total) by mouth daily with breakfast.          Total Time in preparing paper work, data  evaluation and todays exam - 35 minutes  Auburn Bilberry M.D on 12/14/2015 at 2:23 [facility]  [facility]   Office  [phone]

## 2015-12-14 NOTE — Progress Notes (Signed)
Inpatient Diabetes Program Recommendations  AACE/ADA: New Consensus Statement on Inpatient Glycemic Control (2015)  Target Ranges:  Prepandial:   less than 140 mg/dL      Peak postprandial:   less than 180 mg/dL (1-2 hours)      Critically ill patients:  140 - 180 mg/dL  Results for Meghan Jimenez, Meghan Jimenez (MRN [id number]) as of 12/14/2015 09:31  Ref. Range 12/13/2015 03:36 12/13/2015 07:35 12/13/2015 12:32 12/13/2015 16:48 12/13/2015 21:43 12/14/2015 07:30  Glucose-Capillary Latest Ref Range: 65 - 99 mg/dL 409161 (H) 811223 (H) 914350 (H) 144 (H) 207 (H) 150 (H)    Review of Glycemic Control  Diabetes history: DM2 Outpatient Diabetes medications: None listed on home medication list Current orders for Inpatient glycemic control: Novolog 0-9 units TID with meals, Novolog 0-5 units QHS  Inpatient Diabetes Program Recommendations: Correction (SSI): If steroids are continued, please consider increasing Novolog correction to moderate scale. Insulin - Meal Coverage: If steroids are continued, please consider ordering Novolog 4 units TID with meals for meal coverage if patient eats at least 50% of meals.  Thanks, Orlando PennerMarie Myleah Cavendish, RN, MSN, CDE Diabetes Coordinator Inpatient Diabetes Program [phone] (Team Pager from 8am to 5pm) [phone] ([facility] office) [phone] [facility] office) [phone] [facility] office)

## 2015-12-14 NOTE — Discharge Instructions (Signed)

## 2015-12-14 NOTE — Progress Notes (Signed)
Discharge paperwork given to patient who verbalized understanding of instructions. Prescriptions given to and reviewed with patient. No belongings in room at time of discharge, patient aware. Patient's daughter to transport patient home.

## 2015-12-14 NOTE — Progress Notes (Signed)
Dr. Allena KatzPatel notified critical troponin 0.07.  MD acknowledged. Will continue to monitor.

## 2015-12-15 LAB — H PYLORI, IGM, IGG, IGA AB: H. Pylogi, Igm Abs: <9 units (ref 0.0–8.9)

## 2016-02-04 ENCOUNTER — Emergency Department: Payer: Medicare HMO

## 2016-02-04 ENCOUNTER — Emergency Department
Admission: EM | Admit: 2016-02-04 | Discharge: 2016-02-04 | Disposition: A | Discharge status: Home / Self Care | Payer: Medicare HMO | Attending: Emergency Medicine | Admitting: Emergency Medicine

## 2016-02-04 DIAGNOSIS — E119 Type 2 diabetes mellitus without complications: Secondary | ICD-10-CM | POA: Insufficient documentation

## 2016-02-04 DIAGNOSIS — R51 Headache: Secondary | ICD-10-CM | POA: Diagnosis not present

## 2016-02-04 DIAGNOSIS — R079 Chest pain, unspecified: Secondary | ICD-10-CM | POA: Insufficient documentation

## 2016-02-04 DIAGNOSIS — W57XXXA Bitten or stung by nonvenomous insect and other nonvenomous arthropods, initial encounter: Secondary | ICD-10-CM | POA: Diagnosis not present

## 2016-02-04 DIAGNOSIS — I509 Heart failure, unspecified: Secondary | ICD-10-CM | POA: Insufficient documentation

## 2016-02-04 DIAGNOSIS — Z79899 Other long term (current) drug therapy: Secondary | ICD-10-CM | POA: Diagnosis not present

## 2016-02-04 DIAGNOSIS — F17210 Nicotine dependence, cigarettes, uncomplicated: Secondary | ICD-10-CM | POA: Diagnosis not present

## 2016-02-04 DIAGNOSIS — Y939 Activity, unspecified: Secondary | ICD-10-CM | POA: Diagnosis not present

## 2016-02-04 DIAGNOSIS — I110 Hypertensive heart disease with heart failure: Secondary | ICD-10-CM | POA: Diagnosis not present

## 2016-02-04 DIAGNOSIS — Y999 Unspecified external cause status: Secondary | ICD-10-CM | POA: Diagnosis not present

## 2016-02-04 DIAGNOSIS — Z7902 Long term (current) use of antithrombotics/antiplatelets: Secondary | ICD-10-CM | POA: Insufficient documentation

## 2016-02-04 DIAGNOSIS — S80261A Insect bite (nonvenomous), right knee, initial encounter: Secondary | ICD-10-CM | POA: Diagnosis not present

## 2016-02-04 DIAGNOSIS — J449 Chronic obstructive pulmonary disease, unspecified: Secondary | ICD-10-CM | POA: Diagnosis not present

## 2016-02-04 DIAGNOSIS — Y929 Unspecified place or not applicable: Secondary | ICD-10-CM | POA: Insufficient documentation

## 2016-02-04 LAB — HEPATIC FUNCTION PANEL
ALBUMIN: 3.2 g/dL — AB (ref 3.5–5.0)
ALT: 62 U/L — AB (ref 14–54)
AST: 87 U/L — AB (ref 15–41)
Alkaline Phosphatase: 133 U/L — ABNORMAL HIGH (ref 38–126)
Bilirubin, Direct: 0.2 mg/dL (ref 0.1–0.5)
Indirect Bilirubin: 0.6 mg/dL (ref 0.3–0.9)
TOTAL PROTEIN: 6.8 g/dL (ref 6.5–8.1)
Total Bilirubin: 0.8 mg/dL (ref 0.3–1.2)

## 2016-02-04 LAB — TROPONIN I

## 2016-02-04 LAB — BASIC METABOLIC PANEL
Anion gap: 6 (ref 5–15)
BUN: 14 mg/dL (ref 6–20)
CALCIUM: 8.6 mg/dL — AB (ref 8.9–10.3)
CHLORIDE: 105 mmol/L (ref 101–111)
CO2: 25 mmol/L (ref 22–32)
CREATININE: 1.1 mg/dL — AB (ref 0.44–1.00)
GFR calc non Af Amer: 55 mL/min — ABNORMAL LOW (ref >60)
GLUCOSE: 79 mg/dL (ref 65–99)
Potassium: 3.6 mmol/L (ref 3.5–5.1)
Sodium: 136 mmol/L (ref 135–145)

## 2016-02-04 LAB — CBC
HCT: 28.2 % — ABNORMAL LOW (ref 35.0–47.0)
Hemoglobin: 9 g/dL — ABNORMAL LOW (ref 12.0–16.0)
MCH: 21.2 pg — AB (ref 26.0–34.0)
MCHC: 32 g/dL (ref 32.0–36.0)
MCV: 66.3 fL — AB (ref 80.0–100.0)
PLATELETS: 118 10*3/uL — AB (ref 150–440)
RBC: 4.26 MIL/uL (ref 3.80–5.20)
RDW: 23 % — ABNORMAL HIGH (ref 11.5–14.5)
WBC: 5.2 10*3/uL (ref 3.6–11.0)

## 2016-02-04 LAB — ETHANOL: Alcohol, Ethyl (B): <5 mg/dL (ref <5)

## 2016-02-04 LAB — AMMONIA: Ammonia: 35 umol/L (ref 9–35)

## 2016-02-04 MED ORDER — CEPHALEXIN 500 MG PO CAPS: 500.0000 mg | ORAL_CAPSULE | Freq: Four times a day (QID) | ORAL | 0 refills | Status: AC

## 2016-02-04 NOTE — ED Triage Notes (Addendum)
Pt bib EMS from side of road w/ multiple complaints.  Per EMS, pt ran out of gas on [address] and called out for CP.  Per EMS, pt stated that she has been having "mini strokes".  Pt sts that she has been having weakness, CP, nausea since this AM.  Pt sts "I can't remember nothing" and "I think I got pneumonia".  Pt alert and oriented to self and situation.  Pt denies ETOH or drug use.  Pt resp even and unlabored, skin tone warm and even.  Pt able to move all limbs on command w/o issue.  Pt also sts that she has bug bite to R leg and feels "poison running through me".  NAD

## 2016-02-04 NOTE — Discharge Instructions (Signed)
Please contact her primary physician and cardiologist for further outpatient assessment of your chest pain. Please continue all your current medications.  Please return immediately if condition worsens. Please contact her primary physician or the physician you were given for referral. If you have any specialist physicians involved in her treatment and plan please also contact them. Thank you for using [place] [facility] emergency [facility].

## 2016-02-04 NOTE — ED Provider Notes (Signed)
Time Seen: Approximately 2031  I have reviewed the triage notes  Chief Complaint: Insect Bite and Chest Pain   History of Present Illness: Meghan Jimenez is a 55 y.o. female who presents with multiple complaints. Patient states her car ran out of gas and she notified EMS and called them because she was having chest pain. She did denies any focal chest pain at this time she states she was nauseated. She states that she has a headache but denies any focal weakness in either upper or lower extremities. Her main focus seems to be some bug bites on her right knee area that she feels may be causing all for other symptoms. Patient denies any substance abuse or carbon monoxide exposure. 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 She does have a previous history of cardiovascular disease. Patient denies any suicidal thoughts, homicidal thoughts or hallucinations  Past Medical History:  Diagnosis Date  . CHF (congestive heart failure) (HCC)   . Cirrhosis (HCC)   . COPD (chronic obstructive pulmonary disease) (HCC)   . Diabetes mellitus without complication (HCC)   . Hepatitis C   . Hypertension     Patient Active Problem List   Diagnosis Date Noted  . PTSD (post-traumatic stress disorder) 12/13/2015  . Dysthymia 12/13/2015  . GIB (gastrointestinal bleeding) 12/10/2015  . Unstable angina (HCC) 11/11/2015    Past Surgical History:  Procedure Laterality Date  . BACK SURGERY    . CARDIAC SURGERY    . ESOPHAGOGASTRODUODENOSCOPY (EGD) WITH PROPOFOL N/A 12/11/2015   Procedure: ESOPHAGOGASTRODUODENOSCOPY (EGD) WITH PROPOFOL;  Surgeon: [name], MD;  Location: [facility] ENDOSCOPY;  Service: Gastroenterology;  Laterality: N/A;    Past Surgical History:  Procedure Laterality Date  . BACK SURGERY    . CARDIAC SURGERY    . ESOPHAGOGASTRODUODENOSCOPY (EGD) WITH PROPOFOL N/A 12/11/2015   Procedure: ESOPHAGOGASTRODUODENOSCOPY (EGD) WITH PROPOFOL;  Surgeon: [name], MD;   Location: [facility] ENDOSCOPY;  Service: Gastroenterology;  Laterality: N/A;    Current Outpatient Rx  . Order #: [id number] Class: Normal  . Order #: [id number] Class: Print  . Order #: [id number] Class: Historical Med  . Order #: [id number] Class: Historical Med  . Order #: [id number] Class: Print  . Order #: [id number] Class: Normal  . Order #: [id number] Class: Print  . Order #: [id number] Class: Print  . Order #: [id number] Class: Print  . Order #: [id number] Class: Print    Allergies:  Aspirin; Tramadol; Latex; and Vicodin [hydrocodone-acetaminophen]  Family History: Family History  Problem Relation Age of Onset  . CAD Mother   . Diabetes Mother   . Lung cancer Father     Social History: Social History  Substance Use Topics  . Smoking status: Current Every Day Smoker    Packs/day: 1.00    Types: Cigarettes  . Smokeless tobacco: Never Used     Comment: SMOKES 3-4 PKS/DAY  . Alcohol use Yes     Comment: Drinks occasionally every week     Review of Systems:   10 point review of systems was performed and was otherwise negative:  Constitutional: No fever Eyes: No visual disturbances ENT: No sore throat, ear pain Cardiac: No Current chest pain Respiratory: No shortness of breath, wheezing, or stridor Abdomen: No abdominal pain, no vomiting, No diarrhea Endocrine: No weight loss, No night sweats Extremities: No peripheral edema, cyanosis Skin: Patient points out the lesions on her right knee. She denies any other skin lesions or rashes. Neurologic: No focal weakness, trouble with speech or swollowing Urologic: No dysuria,  Hematuria, or urinary frequency   Physical Exam:  ED Triage Vitals  Enc Vitals Group     BP 02/04/16 2028 (!) 119/59     Pulse Rate 02/04/16 2028 89     Resp 02/04/16 2028 16     Temp 02/04/16 2028 98.2 F (36.8 C)     Temp Source 02/04/16 2028 Oral     SpO2 02/04/16 2028 99 %     Weight 02/04/16 2028 200 lb (90.7 kg)     Height --      Head Circumference  --      Peak Flow --      Pain Score 02/04/16 2029 10     Pain Loc --      Pain Edu? --      Excl. in GC? --     General: Awake , Alert , and Oriented times 3; GCS 15 Head: Normal cephalic , atraumatic Eyes: Pupils equal , round, reactive to light Nose/Throat: No nasal drainage, patent upper airway without erythema or exudate.  Neck: Supple, Full range of motion, No anterior adenopathy or palpable thyroid masses Lungs: Clear to ascultation without wheezes , rhonchi, or rales Heart: Regular rate, regular rhythm without murmurs , gallops , or rubs Abdomen: Soft, non tender without rebound, guarding , or rigidity; bowel sounds positive and symmetric in all 4 quadrants. No organomegaly .        Extremities: The 3 regions of bug bites on her left knee area do not have any confluence or indications of severe cellulitis. She has full range of motion of her right knee. Neurologic: normal ambulation, Motor symmetric without deficits, sensory intact Skin: warm, dry, no rashes   Labs:   All laboratory work was reviewed including any pertinent negatives or positives listed below:  Labs Reviewed  BASIC METABOLIC PANEL - Abnormal; Notable for the following:       Result Value   Creatinine, Ser 1.10 (*)    Calcium 8.6 (*)    GFR calc non Af Amer 55 (*)    All other components within normal limits  CBC - Abnormal; Notable for the following:    Hemoglobin 9.0 (*)    HCT 28.2 (*)    MCV 66.3 (*)    MCH 21.2 (*)    RDW 23.0 (*)    Platelets 118 (*)    All other components within normal limits  HEPATIC FUNCTION PANEL - Abnormal; Notable for the following:    Albumin 3.2 (*)    AST 87 (*)    ALT 62 (*)    Alkaline Phosphatase 133 (*)    All other components within normal limits  TROPONIN I  ETHANOL  AMMONIA  Patient's laboratory work shows a low hemoglobin the review of her previous laboratory studies show this is consistent with previous results  EKG: ED ECG REPORT I, Jennye MoccasinBrian S Quigley,  the attending physician, personally viewed and interpreted this ECG.  Date: 02/05/2016 EKG Time: 2027Rate: 89 Rhythm: normal sinus rhythm QRS Axis: normal Intervals: Old right bundle-branch block ST/T Wave abnormalities: normal Conduction Disturbances: none Narrative Interpretation: unremarkable No acute ischemic changes   Radiology:  "Dg Chest 2 View  Result Date: 02/04/2016 CLINICAL DATA:  Acute onset of generalized chest pain, weakness and nausea. Initial encounter. EXAM: CHEST  2 VIEW COMPARISON:  Chest radiograph performed 12/11/2015, and CTA of the chest performed 12/13/2015 FINDINGS: The lungs are well-aerated. Mild vascular congestion is noted. Mild left basilar opacity may reflect atelectasis or possibly  mild pneumonia, given clinical concern. There is no evidence of pleural effusion or pneumothorax. The heart is borderline normal in size. No acute osseous abnormalities are seen. Cervical spinal fusion hardware is noted. IMPRESSION: Mild vascular congestion noted. Mild left basilar opacity may reflect atelectasis or possibly mild pneumonia, given clinical concern. Electronically Signed   By: Roanna Raider M.D.   On: 02/04/2016 21:20   Ct Head Wo Contrast  Result Date: 02/04/2016 CLINICAL DATA:  Acute onset of generalized weakness and amnesia. Initial encounter. EXAM: CT HEAD WITHOUT CONTRAST TECHNIQUE: Contiguous axial images were obtained from the base of the skull through the vertex without intravenous contrast. COMPARISON:  CT of the head performed 08/31/2013 FINDINGS: Brain: No evidence of acute infarction, hemorrhage, hydrocephalus or extra-axial collection. A stable 7 mm colloid cyst is noted. Mild subcortical white matter change likely reflects small vessel ischemic microangiopathy. The posterior fossa, including the cerebellum, brainstem and fourth ventricle, is within normal limits. The third and lateral ventricles, and basal ganglia are unremarkable in appearance. The cerebral  hemispheres are symmetric in appearance, with normal gray-white differentiation. No mass effect or midline shift is seen. Vascular: No hyperdense vessel or unexpected calcification. Skull: There is no evidence of fracture; visualized osseous structures are unremarkable in appearance. Sinuses/Orbits: The visualized portions of the orbits are within normal limits. The paranasal sinuses and mastoid air cells are well-aerated. Other: No significant soft tissue abnormalities are seen. IMPRESSION: 1. No acute intracranial pathology seen on CT. 2. Mild small vessel ischemic microangiopathy. 3. Stable 7 mm colloid cyst noted. Electronically Signed   By: Roanna Raider M.D.   On: 02/04/2016 21:50  "  I personally reviewed the radiologic studies    ED Course:  Patient's stay here was uneventful and she seems to be hemodynamically stable. Not sure exactly what her main concern was to come to the emergency department tonight except other than the bug bites on her right knee which seemed to be old with some mild erythema surrounding them. I placed her on Keflex on an outpatient basis. Workup for headaches and chest discomfort etc. all seem to be within normal limits. She has a known history of cirrhosis and anemia and I don't find any significant changes in her laboratory work etc. Clinical Course     Assessment:  Acute unspecified chest pain Mild cellulitis right knee   Final Clinical Impression:  Final diagnoses:  Chest pain, unspecified type     Plan: * Outpatient " Discharge Medication List as of 02/04/2016 11:19 PM    " Patient was advised to return immediately if condition worsens. Patient was advised to follow up with their primary care physician or other specialized physicians involved in their outpatient care. The patient and/or family member/power of attorney had laboratory results reviewed at the bedside. All questions and concerns were addressed and appropriate discharge instructions  were distributed by the nursing staff.              Jennye Moccasin, MD 02/05/16 [id number]

## 2016-06-22 ENCOUNTER — Emergency Department: Payer: Medicare Other

## 2016-06-22 ENCOUNTER — Encounter: Payer: Self-pay | Admitting: Emergency Medicine

## 2016-06-22 ENCOUNTER — Emergency Department
Admission: EM | Admit: 2016-06-22 | Discharge: 2016-06-22 | Disposition: A | Discharge status: Home / Self Care | Payer: Medicare Other | Attending: Emergency Medicine | Admitting: Emergency Medicine

## 2016-06-22 DIAGNOSIS — F17210 Nicotine dependence, cigarettes, uncomplicated: Secondary | ICD-10-CM | POA: Diagnosis not present

## 2016-06-22 DIAGNOSIS — I110 Hypertensive heart disease with heart failure: Secondary | ICD-10-CM | POA: Insufficient documentation

## 2016-06-22 DIAGNOSIS — J449 Chronic obstructive pulmonary disease, unspecified: Secondary | ICD-10-CM | POA: Diagnosis not present

## 2016-06-22 DIAGNOSIS — M79604 Pain in right leg: Secondary | ICD-10-CM | POA: Insufficient documentation

## 2016-06-22 DIAGNOSIS — E119 Type 2 diabetes mellitus without complications: Secondary | ICD-10-CM | POA: Diagnosis not present

## 2016-06-22 DIAGNOSIS — I509 Heart failure, unspecified: Secondary | ICD-10-CM | POA: Diagnosis not present

## 2016-06-22 DIAGNOSIS — Z79899 Other long term (current) drug therapy: Secondary | ICD-10-CM | POA: Diagnosis not present

## 2016-06-22 DIAGNOSIS — M79605 Pain in left leg: Secondary | ICD-10-CM | POA: Insufficient documentation

## 2016-06-22 DIAGNOSIS — R0789 Other chest pain: Secondary | ICD-10-CM | POA: Insufficient documentation

## 2016-06-22 DIAGNOSIS — R079 Chest pain, unspecified: Secondary | ICD-10-CM

## 2016-06-22 LAB — BASIC METABOLIC PANEL WITH GFR
Anion gap: 5 (ref 5–15)
BUN: 7 mg/dL (ref 6–20)
CO2: 25 mmol/L (ref 22–32)
Calcium: 8.8 mg/dL — ABNORMAL LOW (ref 8.9–10.3)
Chloride: 104 mmol/L (ref 101–111)
Creatinine, Ser: 0.73 mg/dL (ref 0.44–1.00)
GFR calc Af Amer: >60 mL/min
GFR calc non Af Amer: >60 mL/min
Glucose, Bld: 171 mg/dL — ABNORMAL HIGH (ref 65–99)
Potassium: 3.4 mmol/L — ABNORMAL LOW (ref 3.5–5.1)
Sodium: 134 mmol/L — ABNORMAL LOW (ref 135–145)

## 2016-06-22 LAB — CBC
HCT: 30.4 % — ABNORMAL LOW (ref 35.0–47.0)
Hemoglobin: 9.5 g/dL — ABNORMAL LOW (ref 12.0–16.0)
MCH: 19 pg — ABNORMAL LOW (ref 26.0–34.0)
MCHC: 31.1 g/dL — ABNORMAL LOW (ref 32.0–36.0)
MCV: 61 fL — ABNORMAL LOW (ref 80.0–100.0)
Platelets: 110 10*3/uL — ABNORMAL LOW (ref 150–440)
RBC: 4.99 MIL/uL (ref 3.80–5.20)
RDW: 19.8 % — ABNORMAL HIGH (ref 11.5–14.5)
WBC: 5.5 10*3/uL (ref 3.6–11.0)

## 2016-06-22 LAB — TROPONIN I: Troponin I: <0.03 ng/mL

## 2016-06-22 MED ORDER — ONDANSETRON HCL 4 MG/2ML IJ SOLN
4.0000 mg | Freq: Once | INTRAMUSCULAR | Status: AC
  Administered 2016-06-22: 4 mg via INTRAVENOUS
  Filled 2016-06-22: qty 2

## 2016-06-22 MED ORDER — IOPAMIDOL (ISOVUE-370) INJECTION 76%
125.0000 mL | Freq: Once | INTRAVENOUS | Status: AC | PRN
  Administered 2016-06-22: 125 mL via INTRAVENOUS

## 2016-06-22 MED ORDER — MORPHINE SULFATE (PF) 4 MG/ML IV SOLN
4.0000 mg | Freq: Once | INTRAVENOUS | Status: AC
  Administered 2016-06-22: 4 mg via INTRAVENOUS
  Filled 2016-06-22: qty 1

## 2016-06-22 MED ORDER — OXYCODONE-ACETAMINOPHEN 5-325 MG PO TABS
1.0000 | ORAL_TABLET | Freq: Once | ORAL | Status: AC
  Administered 2016-06-22: 1 via ORAL
  Filled 2016-06-22: qty 1

## 2016-06-22 NOTE — ED Notes (Signed)
ED Provider at bedside. 

## 2016-06-22 NOTE — ED Provider Notes (Addendum)
[facility] Emergency Department Provider Note ____________________________________________   I have reviewed the triage vital signs and the triage nursing note.  HISTORY  Chief Complaint Chest Pain   Historian Patient and boyfriend in the room  HPI Meghan Jimenez is a 56 y.o. female with history of CHF, hypertension, cardiac stent, she thinks several years ago, COPD and cirrhosis, presents today for what sounds like several weeks of intermittent left-sided chest pain often associated with nausea, as well as progression of bilateral leg pain from her groin down to her feet that sounds like maybe started about a week ago and she thinks it's getting worse. She states it doesn't seem like neuropathy to her. She saw her cardiologist Dr. Vennie Homans yesterday, and apparently she thinks that she may be getting a cardiac catheter in the future. She states that she called tonight regarding lowerextremity pain that was worsening and she was told to come into the ED for further evaluation.  For me she is mostly complaining of leg pain which she points to the groin up into the middle of her abdomen and then states that yes she is been having a lot of chest pains as well.    Past Medical History:  Diagnosis Date  . CHF (congestive heart failure) (HCC)   . Cirrhosis (HCC)   . COPD (chronic obstructive pulmonary disease) (HCC)   . Diabetes mellitus without complication (HCC)   . Hepatitis C   . Hypertension     Patient Active Problem List   Diagnosis Date Noted  . PTSD (post-traumatic stress disorder) 12/13/2015  . Dysthymia 12/13/2015  . GIB (gastrointestinal bleeding) 12/10/2015  . Unstable angina (HCC) 11/11/2015    Past Surgical History:  Procedure Laterality Date  . BACK SURGERY    . CARDIAC SURGERY    . ESOPHAGOGASTRODUODENOSCOPY (EGD) WITH PROPOFOL N/A 12/11/2015   Procedure: ESOPHAGOGASTRODUODENOSCOPY (EGD) WITH PROPOFOL;  Surgeon: [name], MD;   Location: [facility] ENDOSCOPY;  Service: Gastroenterology;  Laterality: N/A;    Prior to Admission medications   Medication Sig Start Date End Date Taking? Authorizing Provider  ascorbic acid (VITAMIN C) 1000 MG tablet Take 1 tablet (1,000 mg total) by mouth daily. 12/14/15   Auburn Bilberry, MD  clopidogrel (PLAVIX) 75 MG tablet Take 75 mg by mouth daily.    Historical Provider, MD  diphenhydrAMINE (BENADRYL) 50 MG capsule Take 50 mg by mouth every 6 (six) hours as needed.    Historical Provider, MD  docusate sodium (COLACE) 100 MG capsule Take 1 capsule (100 mg total) by mouth 2 (two) times daily. 12/14/15   Auburn Bilberry, MD  gabapentin (NEURONTIN) 300 MG capsule Take 1 capsule (300 mg total) by mouth 3 (three) times daily. 12/14/15   Auburn Bilberry, MD  hydrOXYzine (ATARAX/VISTARIL) 10 MG tablet Take 1 tablet (10 mg total) by mouth 3 (three) times daily as needed. 12/14/15   Auburn Bilberry, MD  pantoprazole (PROTONIX) 40 MG tablet Take 1 tablet (40 mg total) by mouth 2 (two) times daily. 12/14/15   Auburn Bilberry, MD  traZODone (DESYREL) 100 MG tablet Take 1 tablet (100 mg total) by mouth at bedtime. 12/14/15   Auburn Bilberry, MD  venlafaxine XR (EFFEXOR-XR) 150 MG 24 hr capsule Take 1 capsule (150 mg total) by mouth daily with breakfast. 12/14/15   Auburn Bilberry, MD    Allergies  Allergen Reactions  . Aspirin Anaphylaxis and Palpitations    Patient takes ibuprofen as home med, so no problem with NSAIDs  . Tramadol  Anaphylaxis  . Latex   . Vicodin [Hydrocodone-Acetaminophen] Palpitations    Family History  Problem Relation Age of Onset  . CAD Mother   . Diabetes Mother   . Lung cancer Father     Social History Social History  Substance Use Topics  . Smoking status: Current Every Day Smoker    Packs/day: 1.00    Types: Cigarettes  . Smokeless tobacco: Never Used     Comment: SMOKES 3-4 PKS/DAY  . Alcohol use Yes     Comment: Drinks occasionally every week    Review of  Systems  Constitutional: Negative for fever. Eyes: Negative for visual changes. ENT: Negative for sore throat. Cardiovascular: Positive for chest pain. Respiratory: Negative for shortness of breath. Gastrointestinal: Negative for vomiting and diarrhea. Genitourinary: Negative for dysuria. Musculoskeletal: Negative for back pain. Skin: Negative for rash. Neurological: Negative for headache. 10 point Review of Systems otherwise negative ____________________________________________   PHYSICAL EXAM:  VITAL SIGNS: ED Triage Vitals  Enc Vitals Group     BP 06/22/16 1834 (!) 144/69     Pulse Rate 06/22/16 1834 92     Resp 06/22/16 1829 20     Temp 06/22/16 1834 98.8 F (37.1 C)     Temp Source 06/22/16 1829 Oral     SpO2 06/22/16 1834 98 %     Weight 06/22/16 1829 203 lb (92.1 kg)     Height 06/22/16 1829 5\' 4"  (1.626 m)     Head Circumference --      Peak Flow --      Pain Score 06/22/16 1834 10     Pain Loc --      Pain Edu? --      Excl. in GC? --      Constitutional: Alert and oriented. Well appearing and in no distress. HEENT   Head: Normocephalic and atraumatic.      Eyes: Conjunctivae are normal. PERRL. Normal extraocular movements.  Some dark circles under the eyes      Ears:         Nose: No congestion/rhinnorhea.   Mouth/Throat: Mucous membranes are moist.   Neck: No stridor. Cardiovascular/Chest: Normal rate, regular rhythm.  No murmurs, rubs, or gallops. Respiratory: Normal respiratory effort without tachypnea nor retractions. Breath sounds are clear and equal bilaterally. No wheezes/rales/rhonchi. Gastrointestinal: Soft. No distention, no guarding, no rebound. Mild tenderness to palpation especially lower.  Genitourinary/rectal:Deferred Musculoskeletal: Pelvis stable. Patient has tenderness along her entire leg is where there is no significant edema or redness. Neurologic:  Normal speech and language. No gross or focal neurologic deficits are  appreciated. Skin:  Skin is warm, dry and intact. No rash noted. Psychiatric: Mood and affect are normal. Speech and behavior are normal. Patient exhibits appropriate insight and judgment.   ____________________________________________  LABS (pertinent positives/negatives)  Labs Reviewed  BASIC METABOLIC PANEL - Abnormal; Notable for the following:       Result Value   Sodium 134 (*)    Potassium 3.4 (*)    Glucose, Bld 171 (*)    Calcium 8.8 (*)    All other components within normal limits  CBC - Abnormal; Notable for the following:    Hemoglobin 9.5 (*)    HCT 30.4 (*)    MCV 61.0 (*)    MCH 19.0 (*)    MCHC 31.1 (*)    RDW 19.8 (*)    Platelets 110 (*)    All other components within normal limits  TROPONIN I  ____________________________________________    EKG I, Governor Rooksebecca Darrin Koman, MD, the attending physician have personally viewed and interpreted all ECGs.  80 bpm. Normal sinus rhythm. Incomplete right bundle-branch block. Nonspecific ST and T-wave ____________________________________________  RADIOLOGY All Xrays were viewed by me. Imaging interpreted by Radiologist.  Chest x-ray two-view: Peribronchial thickening noted. Lungs otherwise grossly clear.  CT angiogram aorta with bifemoral:  IMPRESSION: VASCULAR  1. Patent three-vessel runoff noted bilaterally. No calcific atherosclerotic disease or narrowing seen along the arterial structures of the lower extremities. 2. Scattered aortic atherosclerosis.  NON-VASCULAR  1. Findings of hepatic cirrhosis. 2. Mild haziness at the lung bases likely reflects atelectasis. 3. Mild degenerative change along the lower thoracic and lumbar spine.   US le bilateral: IMPRESSION: No evidence of deep venous thrombosis within either lower extremity. __________________________________________  PROCEDURES  Procedure(s) performed: None  Critical Care performed:  None  ____________________________________________   ED COURSE / ASSESSMENT AND PLAN  Pertinent labs & imaging results that were available during my care of the patient were reviewed by me and considered in my medical decision making (see chart for details).   Ms. Jacqualyn PoseyHelbert is here with complaint of chronic chest discomfort which she just saw her cardiologist, but more acutely she came to the ED because of the leg pain from middle of the abdomen all the way down to her toes. From the standpoint of her chest discomfort, it's been ongoing for quite some time now, and her EKG and troponin are reassuring. She has close follow-up with her primary cardiologist.  Today in the ED, although her legs do not look ischemic, I did speak with her about evaluating for vascular emergency and she had artery evaluation with CT angiogram abdomen with runoff which was reassuring for the abdomen as well as the vasculature. Lower extremities Dopplers are negative.  There is no evidence of cellulitis. Uncertain cause of her leg pains, but she has been told that she is going to be referred for chronic pain doctor.  Okay for discharge from the emergency department tonight.  Patient requested pain medication, I discussed I would not write narcotic for chronic type pain.  She will have one dose here - reports allergy to vicodin, but tolerates percocet just fine.  CONSULTATIONS:  None   Patient / Family / Caregiver informed of clinical course, medical decision-making process, and agree with plan.   I discussed return precautions, follow-up instructions, and discharge instructions with patient and/or family.   ___________________________________________   FINAL CLINICAL IMPRESSION(S) / ED DIAGNOSES   Final diagnoses:  Nonspecific chest pain  Bilateral leg pain              Note: This dictation was prepared with Dragon dictation. Any transcriptional errors that result from this process are  unintentional    Governor Rooksebecca Tristina Sahagian, MD 06/22/16 2314    Governor Rooksebecca Calden Dorsey, MD 06/22/16 2322

## 2016-06-22 NOTE — Discharge Instructions (Signed)
You were evaluated for leg pain and ongoing chest pain, and although no certain cause was found, and your exam and evaluation are reassuring in the emergency department tonight.  Please follow up with your primary care doctor as well as her cardiac disease.  Return to the emergency department immediately for any worsening chest pain or abdominal pain, nausea, black or bloody stool, dizziness or passing out, confusion or altered mental status, or any other symptoms concerning to you.

## 2016-06-22 NOTE — ED Notes (Signed)
Pt states she was at cardiologist yesterday c/o left sided cp and pain that is in both of her legs from the hip down.  She says she had a cardiac stent placed but cannot recall when it was put in, other than "long time ago".  Patient has 2+ pedal pulses bilaterally.  She says the cardiologist referred her here for the leg pain.

## 2016-06-22 NOTE — ED Notes (Signed)
Patient placed on cardiac monitor.

## 2016-06-22 NOTE — ED Triage Notes (Signed)
C/O mid chest pain x 3 weeks   Initially pain was intermittent, but the last few weeks legs hurting.

## 2016-06-22 NOTE — ED Notes (Signed)
Patient transported to CT 

## 2016-08-24 ENCOUNTER — Encounter: Payer: Self-pay | Admitting: Emergency Medicine

## 2016-08-24 ENCOUNTER — Emergency Department
Admission: EM | Admit: 2016-08-24 | Discharge: 2016-08-24 | Disposition: A | Discharge status: Home / Self Care | Payer: Medicare Other | Attending: Emergency Medicine | Admitting: Emergency Medicine

## 2016-08-24 DIAGNOSIS — J449 Chronic obstructive pulmonary disease, unspecified: Secondary | ICD-10-CM | POA: Diagnosis not present

## 2016-08-24 DIAGNOSIS — F17210 Nicotine dependence, cigarettes, uncomplicated: Secondary | ICD-10-CM | POA: Diagnosis not present

## 2016-08-24 DIAGNOSIS — I509 Heart failure, unspecified: Secondary | ICD-10-CM | POA: Insufficient documentation

## 2016-08-24 DIAGNOSIS — I110 Hypertensive heart disease with heart failure: Secondary | ICD-10-CM | POA: Diagnosis not present

## 2016-08-24 DIAGNOSIS — R197 Diarrhea, unspecified: Secondary | ICD-10-CM | POA: Insufficient documentation

## 2016-08-24 DIAGNOSIS — E119 Type 2 diabetes mellitus without complications: Secondary | ICD-10-CM | POA: Insufficient documentation

## 2016-08-24 DIAGNOSIS — R112 Nausea with vomiting, unspecified: Secondary | ICD-10-CM

## 2016-08-24 DIAGNOSIS — N390 Urinary tract infection, site not specified: Secondary | ICD-10-CM

## 2016-08-24 LAB — COMPREHENSIVE METABOLIC PANEL
ALK PHOS: 145 U/L — AB (ref 38–126)
ALT: 44 U/L (ref 14–54)
ANION GAP: 9 (ref 5–15)
AST: 60 U/L — ABNORMAL HIGH (ref 15–41)
Albumin: 3.5 g/dL (ref 3.5–5.0)
BILIRUBIN TOTAL: 0.9 mg/dL (ref 0.3–1.2)
BUN: 7 mg/dL (ref 6–20)
CO2: 26 mmol/L (ref 22–32)
Calcium: 9 mg/dL (ref 8.9–10.3)
Chloride: 100 mmol/L — ABNORMAL LOW (ref 101–111)
Creatinine, Ser: 0.57 mg/dL (ref 0.44–1.00)
GFR calc Af Amer: >60 mL/min (ref >60)
GLUCOSE: 90 mg/dL (ref 65–99)
POTASSIUM: 3.6 mmol/L (ref 3.5–5.1)
Sodium: 135 mmol/L (ref 135–145)
TOTAL PROTEIN: 8.5 g/dL — AB (ref 6.5–8.1)

## 2016-08-24 LAB — URINALYSIS, COMPLETE (UACMP) WITH MICROSCOPIC
BILIRUBIN URINE: NEGATIVE
GLUCOSE, UA: NEGATIVE mg/dL
HGB URINE DIPSTICK: NEGATIVE
Ketones, ur: NEGATIVE mg/dL
NITRITE: NEGATIVE
Protein, ur: NEGATIVE mg/dL
SPECIFIC GRAVITY, URINE: 1.004 — AB (ref 1.005–1.030)
pH: 6 (ref 5.0–8.0)

## 2016-08-24 LAB — TROPONIN I

## 2016-08-24 LAB — CBC
HEMATOCRIT: 33 % — AB (ref 35.0–47.0)
HEMOGLOBIN: 10.4 g/dL — AB (ref 12.0–16.0)
MCH: 19.2 pg — ABNORMAL LOW (ref 26.0–34.0)
MCHC: 31.5 g/dL — AB (ref 32.0–36.0)
MCV: 61.1 fL — ABNORMAL LOW (ref 80.0–100.0)
Platelets: 236 10*3/uL (ref 150–440)
RBC: 5.41 MIL/uL — ABNORMAL HIGH (ref 3.80–5.20)
RDW: 19.6 % — AB (ref 11.5–14.5)
WBC: 8.7 10*3/uL (ref 3.6–11.0)

## 2016-08-24 LAB — LIPASE, BLOOD: Lipase: 40 U/L (ref 11–51)

## 2016-08-24 LAB — AMMONIA: Ammonia: <9 umol/L — ABNORMAL LOW (ref 9–35)

## 2016-08-24 MED ORDER — SODIUM CHLORIDE 0.9 % IV BOLUS (SEPSIS)
500.0000 mL | Freq: Once | INTRAVENOUS | Status: AC
  Administered 2016-08-24: 500 mL via INTRAVENOUS

## 2016-08-24 MED ORDER — ONDANSETRON HCL 4 MG PO TABS: 4.0000 mg | ORAL_TABLET | Freq: Three times a day (TID) | ORAL | 0 refills | Status: DC | PRN

## 2016-08-24 MED ORDER — MORPHINE SULFATE (PF) 4 MG/ML IV SOLN
4.0000 mg | Freq: Once | INTRAVENOUS | Status: AC
  Administered 2016-08-24: 4 mg via INTRAVENOUS
  Filled 2016-08-24 (×2): qty 1

## 2016-08-24 MED ORDER — ONDANSETRON HCL 4 MG/2ML IJ SOLN
4.0000 mg | Freq: Once | INTRAMUSCULAR | Status: AC
  Administered 2016-08-24: 4 mg via INTRAVENOUS
  Filled 2016-08-24 (×2): qty 2

## 2016-08-24 MED ORDER — CEFTRIAXONE SODIUM-DEXTROSE 1-3.74 GM-% IV SOLR
1.0000 g | Freq: Once | INTRAVENOUS | Status: AC
  Administered 2016-08-24: 1 g via INTRAVENOUS

## 2016-08-24 MED ORDER — DEXTROSE 5 % IV SOLN: 1.0000 g | Freq: Once | INTRAVENOUS | Status: DC

## 2016-08-24 MED ORDER — CEPHALEXIN 500 MG PO CAPS: 500.0000 mg | ORAL_CAPSULE | Freq: Three times a day (TID) | ORAL | 0 refills | Status: AC

## 2016-08-24 MED ORDER — CEFTRIAXONE SODIUM-DEXTROSE 1-3.74 GM-% IV SOLR
1.0000 g | Freq: Once | INTRAVENOUS | Status: DC
  Filled 2016-08-24: qty 50

## 2016-08-24 NOTE — ED Notes (Signed)
Pt tolerated PO challenge with graham crackers, peanut butter, and ginger ale.

## 2016-08-24 NOTE — ED Triage Notes (Signed)
Patient ambulatory to triage with steady gait, without difficulty or distress noted; pt reports hx stage 4 cirrhosis; c/o generalized abd pain since yesterday accomp by N/V/D

## 2016-08-24 NOTE — Discharge Instructions (Signed)

## 2016-08-24 NOTE — ED Notes (Signed)
2 unsuccessful PIV attempts by this RN (right and left forearm).

## 2016-08-24 NOTE — ED Provider Notes (Signed)
Signout from Dr. Don Perking in this 56 year old female with chronic abdominal pain was presenting with nausea and vomiting and abdominal pain. Plan is to follow up after medication to see if she tolerates by mouth.  Physical Exam  BP 136/75   Pulse 93   Temp 98.4 F (36.9 C) (Oral)   Resp 18   Ht  (1.626 m)   Wt 200 lb (90.7 kg)   SpO2 96%   BMI 34.33 kg/m  ----------------------------------------- 9:01 AM on 08/24/2016 -----------------------------------------   Physical Exam That any distress. Normal vital signs. Abdomen is soft and nontender diffusely. ED Course  Procedures  MDM Will Be discharged home. Will be treated with Zofran as well as Keflex. Plan the patient and she is understanding when to comply.       Myrna Blazer, MD 08/24/16 [phone]

## 2016-08-24 NOTE — ED Provider Notes (Signed)
[facility] Emergency Department Provider Note  ____________________________________________  Time seen: Approximately 5:35 AM  I have reviewed the triage vital signs and the nursing notes.   HISTORY  Chief Complaint Abdominal Pain   HPI Meghan Jimenez is a 56 y.o. female HFpEF, cirrhosis of the liver c/b varices and GUB, hepatitis C, COPD, diabetes, hypertension who presents for evaluation of abdominal pain, vomiting, and diarrhea. Patient reports a month of diffuse dull constant and nonradiating abdominal pain. Currently pain is moderate. For the last 24 hours she has had several episodes of nonbloody nonbilious emesis and watery diarrhea. She reports that the first episode the stool is brown but since then he has been clear like water. No melena, no coffee ground emesis, no hematochezia, no hematemesis. No fever or chills. Patient reports that she has had abdominal pain since having an injection of botulism toxin in her esophagus and had it dilated on endoscopy done at [facility] on 07/28/16. She reports that since then she is only able to drink liquids and is unable to tolerate solids. She has not discussed that with her GI or primary care doctor. She is here today because she started having vomiting and diarrhea. She denies any prior history of SBP, no dysuria or hematuria.  Past Medical History:  Diagnosis Date  . CHF (congestive heart failure) (HCC)   . Cirrhosis (HCC)   . COPD (chronic obstructive pulmonary disease) (HCC)   . Diabetes mellitus without complication (HCC)   . Hepatitis C   . Hypertension     Patient Active Problem List   Diagnosis Date Noted  . PTSD (post-traumatic stress disorder) 12/13/2015  . Dysthymia 12/13/2015  . GIB (gastrointestinal bleeding) 12/10/2015  . Unstable angina (HCC) 11/11/2015    Past Surgical History:  Procedure Laterality Date  . BACK SURGERY    . CARDIAC SURGERY    . ESOPHAGOGASTRODUODENOSCOPY (EGD) WITH  PROPOFOL N/A 12/11/2015   Procedure: ESOPHAGOGASTRODUODENOSCOPY (EGD) WITH PROPOFOL;  Surgeon: [name], MD;  Location: [facility] ENDOSCOPY;  Service: Gastroenterology;  Laterality: N/A;    Prior to Admission medications   Medication Sig Start Date End Date Taking? Authorizing Provider  ascorbic acid (VITAMIN C) 1000 MG tablet Take 1 tablet (1,000 mg total) by mouth daily. 12/14/15   Auburn Bilberry, MD  cephALEXin (KEFLEX) 500 MG capsule Take 1 capsule (500 mg total) by mouth 3 (three) times daily. 08/24/16 08/31/16  Nita Sickle, MD  clopidogrel (PLAVIX) 75 MG tablet Take 75 mg by mouth daily.    Historical Provider, MD  diphenhydrAMINE (BENADRYL) 50 MG capsule Take 50 mg by mouth every 6 (six) hours as needed.    Historical Provider, MD  docusate sodium (COLACE) 100 MG capsule Take 1 capsule (100 mg total) by mouth 2 (two) times daily. 12/14/15   Auburn Bilberry, MD  gabapentin (NEURONTIN) 300 MG capsule Take 1 capsule (300 mg total) by mouth 3 (three) times daily. 12/14/15   Auburn Bilberry, MD  hydrOXYzine (ATARAX/VISTARIL) 10 MG tablet Take 1 tablet (10 mg total) by mouth 3 (three) times daily as needed. 12/14/15   Auburn Bilberry, MD  ondansetron (ZOFRAN) 4 MG tablet Take 1 tablet (4 mg total) by mouth every 8 (eight) hours as needed for nausea or vomiting. 08/24/16   Nita Sickle, MD  pantoprazole (PROTONIX) 40 MG tablet Take 1 tablet (40 mg total) by mouth 2 (two) times daily. 12/14/15   Auburn Bilberry, MD  traZODone (DESYREL) 100 MG tablet Take 1 tablet (100 mg total)  by mouth at bedtime. 12/14/15   Auburn Bilberry, MD  venlafaxine XR (EFFEXOR-XR) 150 MG 24 hr capsule Take 1 capsule (150 mg total) by mouth daily with breakfast. 12/14/15   Auburn Bilberry, MD    Allergies Aspirin; Tramadol; Latex; and Vicodin [hydrocodone-acetaminophen]  Family History  Problem Relation Age of Onset  . CAD Mother   . Diabetes Mother   . Lung cancer Father     Social History Social History  Substance  Use Topics  . Smoking status: Current Every Day Smoker    Packs/day: 1.00    Types: Cigarettes  . Smokeless tobacco: Never Used     Comment: SMOKES 3-4 PKS/DAY  . Alcohol use Yes     Comment: Drinks occasionally every week    Review of Systems  Constitutional: Negative for fever. Eyes: Negative for visual changes. ENT: Negative for sore throat. Neck: No neck pain  Cardiovascular: Negative for chest pain. Respiratory: Negative for shortness of breath. Gastrointestinal: + diffuse abdominal pain, nausea, vomiting and diarrhea. Genitourinary: Negative for dysuria. Musculoskeletal: Negative for back pain. Skin: Negative for rash. Neurological: Negative for headaches, weakness or numbness. Psych: No SI or HI  ____________________________________________   PHYSICAL EXAM:  VITAL SIGNS: ED Triage Vitals  Enc Vitals Group     BP 08/24/16 0129 136/82     Pulse Rate 08/24/16 0129 90     Resp 08/24/16 0129 20     Temp 08/24/16 0117 98.4 F (36.9 C)     Temp Source 08/24/16 0117 Oral     SpO2 08/24/16 0129 97 %     Weight 08/24/16 0118 200 lb (90.7 kg)     Height 08/24/16 0118  (1.626 m)     Head Circumference --      Peak Flow --      Pain Score 08/24/16 0117 9     Pain Loc --      Pain Edu? --      Excl. in GC? --     Constitutional: Alert and oriented. Well appearing and in no apparent distress. HEENT:      Head: Normocephalic and atraumatic.         Eyes: Conjunctivae are normal. Sclera is icteric. EOMI. PERRL      Mouth/Throat: Mucous membranes are moist.       Neck: Supple with no signs of meningismus. Cardiovascular: Regular rate and rhythm. No murmurs, gallops, or rubs. 2+ symmetrical distal pulses are present in all extremities. No JVD. Respiratory: Normal respiratory effort. Lungs are clear to auscultation bilaterally. No wheezes, crackles, or rhonchi.  Gastrointestinal: Soft, non tender, and non distended with positive bowel sounds. No rebound or  guarding. Musculoskeletal: Nontender with normal range of motion in all extremities. No edema, cyanosis, or erythema of extremities. Neurologic: Normal speech and language. Face is symmetric. Moving all extremities. No gross focal neurologic deficits are appreciated. Skin: Skin is warm, dry and intact. No rash noted. Psychiatric: Mood and affect are normal. Speech and behavior are normal.  ____________________________________________   LABS (all labs ordered are listed, but only abnormal results are displayed)  Labs Reviewed  COMPREHENSIVE METABOLIC PANEL - Abnormal; Notable for the following:       Result Value   Chloride 100 (*)    Total Protein 8.5 (*)    AST 60 (*)    Alkaline Phosphatase 145 (*)    All other components within normal limits  CBC - Abnormal; Notable for the following:    RBC 5.41 (*)  Hemoglobin 10.4 (*)    HCT 33.0 (*)    MCV 61.1 (*)    MCH 19.2 (*)    MCHC 31.5 (*)    RDW 19.6 (*)    All other components within normal limits  URINALYSIS, COMPLETE (UACMP) WITH MICROSCOPIC - Abnormal; Notable for the following:    Color, Urine YELLOW (*)    APPearance CLOUDY (*)    Specific Gravity, Urine 1.004 (*)    Leukocytes, UA LARGE (*)    Bacteria, UA RARE (*)    Squamous Epithelial / LPF 6-30 (*)    All other components within normal limits  AMMONIA - Abnormal; Notable for the following:    Ammonia <9 (*)    All other components within normal limits  URINE CULTURE  LIPASE, BLOOD  TROPONIN I   ____________________________________________  EKG  ED ECG REPORT I, Nita Sickle, the attending physician, personally viewed and interpreted this ECG.  Normal sinus rhythm, rate of 85, right bundle branch block, normal QTc interval, normal axis, no ST elevations or depressions. No changes when compared to prior from February 2018 ____________________________________________  RADIOLOGY  none   ____________________________________________   PROCEDURES  Procedure(s) performed:yes Procedures   Bedside US with no evidence of ascites  Critical Care performed:  None ____________________________________________   INITIAL IMPRESSION / ASSESSMENT AND PLAN / ED COURSE  56 y.o. female HFpEF, cirrhosis of the liver c/b varices and GUB, hepatitis C, COPD, diabetes, hypertension who presents for evaluation of abdominal pain, vomiting, and diarrhea. Patient reports that this pain is chronic and she has had it daily for at least a month. She tells me that she is not here because of the abdominal pain but because she started having vomiting and diarrhea today. She is well appearing and in no distress, her vital signs are within normal limits, bedside ultrasound showing no evidence of ascites. Abdomen is soft with diffuse mild tenderness throughout, no rebound or guarding. CBC, CMP and lipase and ammonia were no acute findings. UA positive for urinary tract infection. Patient was given Rocephin. Patient will be given IV fluids, morphine, and Zofran for symptoms. At this time I don't think there is indication for any imaging of her abdomen as this pain seems to be chronic and her presentation is concerning for possible gastroenteritis and a UTI. Plan to dc once patient passes PO challenge.     Pertinent labs & imaging results that were available during my care of the patient were reviewed by me and considered in my medical decision making (see chart for details).    ____________________________________________   FINAL CLINICAL IMPRESSION(S) / ED DIAGNOSES  Final diagnoses:  Nausea vomiting and diarrhea  Lower urinary tract infectious disease      NEW MEDICATIONS STARTED DURING THIS VISIT:  Discharge Medication List as of 08/24/2016  9:02 AM    START taking these medications   Details  cephALEXin (KEFLEX) 500 MG capsule Take 1 capsule (500 mg total) by mouth 3 (three) times daily.,  Starting Thu 08/24/2016, Until Thu 08/31/2016, Print    ondansetron (ZOFRAN) 4 MG tablet Take 1 tablet (4 mg total) by mouth every 8 (eight) hours as needed for nausea or vomiting., Starting Thu 08/24/2016, Print         Note:  This document was prepared using Dragon voice recognition software and may include unintentional dictation errors.    Nita Sickle, MD 08/25/16 2107

## 2016-08-26 LAB — URINE CULTURE: Culture: >=100000 — AB

## 2016-11-12 ENCOUNTER — Encounter: Payer: Self-pay | Admitting: Internal Medicine

## 2016-11-12 ENCOUNTER — Emergency Department: Payer: Medicare Other

## 2016-11-12 ENCOUNTER — Other Ambulatory Visit: Payer: Self-pay

## 2016-11-12 ENCOUNTER — Observation Stay
Admission: EM | Admit: 2016-11-12 | Discharge: 2016-11-13 | Payer: Medicare Other | Attending: Internal Medicine | Admitting: Internal Medicine

## 2016-11-12 DIAGNOSIS — J449 Chronic obstructive pulmonary disease, unspecified: Secondary | ICD-10-CM | POA: Insufficient documentation

## 2016-11-12 DIAGNOSIS — Z955 Presence of coronary angioplasty implant and graft: Secondary | ICD-10-CM | POA: Insufficient documentation

## 2016-11-12 DIAGNOSIS — F4310 Post-traumatic stress disorder, unspecified: Secondary | ICD-10-CM | POA: Insufficient documentation

## 2016-11-12 DIAGNOSIS — D696 Thrombocytopenia, unspecified: Secondary | ICD-10-CM | POA: Diagnosis not present

## 2016-11-12 DIAGNOSIS — R51 Headache: Secondary | ICD-10-CM | POA: Insufficient documentation

## 2016-11-12 DIAGNOSIS — F17210 Nicotine dependence, cigarettes, uncomplicated: Secondary | ICD-10-CM | POA: Diagnosis not present

## 2016-11-12 DIAGNOSIS — Z8249 Family history of ischemic heart disease and other diseases of the circulatory system: Secondary | ICD-10-CM | POA: Insufficient documentation

## 2016-11-12 DIAGNOSIS — R079 Chest pain, unspecified: Secondary | ICD-10-CM | POA: Diagnosis present

## 2016-11-12 DIAGNOSIS — I25110 Atherosclerotic heart disease of native coronary artery with unstable angina pectoris: Principal | ICD-10-CM | POA: Insufficient documentation

## 2016-11-12 DIAGNOSIS — I252 Old myocardial infarction: Secondary | ICD-10-CM | POA: Insufficient documentation

## 2016-11-12 DIAGNOSIS — R112 Nausea with vomiting, unspecified: Secondary | ICD-10-CM | POA: Diagnosis not present

## 2016-11-12 DIAGNOSIS — I509 Heart failure, unspecified: Secondary | ICD-10-CM | POA: Diagnosis not present

## 2016-11-12 DIAGNOSIS — E119 Type 2 diabetes mellitus without complications: Secondary | ICD-10-CM | POA: Insufficient documentation

## 2016-11-12 DIAGNOSIS — B1920 Unspecified viral hepatitis C without hepatic coma: Secondary | ICD-10-CM | POA: Insufficient documentation

## 2016-11-12 DIAGNOSIS — E782 Mixed hyperlipidemia: Secondary | ICD-10-CM | POA: Insufficient documentation

## 2016-11-12 DIAGNOSIS — K7460 Unspecified cirrhosis of liver: Secondary | ICD-10-CM | POA: Diagnosis not present

## 2016-11-12 DIAGNOSIS — Z7902 Long term (current) use of antithrombotics/antiplatelets: Secondary | ICD-10-CM | POA: Diagnosis not present

## 2016-11-12 DIAGNOSIS — I110 Hypertensive heart disease with heart failure: Secondary | ICD-10-CM | POA: Insufficient documentation

## 2016-11-12 DIAGNOSIS — Z885 Allergy status to narcotic agent status: Secondary | ICD-10-CM | POA: Insufficient documentation

## 2016-11-12 HISTORY — DX: Atherosclerotic heart disease of native coronary artery without angina pectoris: I25.10

## 2016-11-12 LAB — TROPONIN I
Troponin I: <0.03 ng/mL (ref <0.03)
Troponin I: <0.03 ng/mL (ref <0.03)
Troponin I: <0.03 ng/mL (ref <0.03)

## 2016-11-12 LAB — CBC
HEMATOCRIT: 33.7 % — AB (ref 35.0–47.0)
Hemoglobin: 10.7 g/dL — ABNORMAL LOW (ref 12.0–16.0)
MCH: 19.5 pg — ABNORMAL LOW (ref 26.0–34.0)
MCHC: 31.7 g/dL — ABNORMAL LOW (ref 32.0–36.0)
MCV: 61.4 fL — ABNORMAL LOW (ref 80.0–100.0)
PLATELETS: 90 10*3/uL — AB (ref 150–440)
RBC: 5.49 MIL/uL — AB (ref 3.80–5.20)
RDW: 20.8 % — AB (ref 11.5–14.5)
WBC: 5.7 10*3/uL (ref 3.6–11.0)

## 2016-11-12 LAB — BASIC METABOLIC PANEL
Anion gap: 10 (ref 5–15)
BUN: 11 mg/dL (ref 6–20)
CO2: 23 mmol/L (ref 22–32)
CREATININE: 0.73 mg/dL (ref 0.44–1.00)
Calcium: 10.3 mg/dL (ref 8.9–10.3)
Chloride: 103 mmol/L (ref 101–111)
Glucose, Bld: 101 mg/dL — ABNORMAL HIGH (ref 65–99)
POTASSIUM: 3.6 mmol/L (ref 3.5–5.1)
SODIUM: 136 mmol/L (ref 135–145)

## 2016-11-12 LAB — TSH: TSH: 1.131 u[IU]/mL (ref 0.350–4.500)

## 2016-11-12 MED ORDER — PNEUMOCOCCAL VAC POLYVALENT 25 MCG/0.5ML IJ INJ: 0.5000 mL | INJECTION | INTRAMUSCULAR | Status: DC

## 2016-11-12 MED ORDER — DIPHENHYDRAMINE HCL 25 MG PO CAPS: 50.0000 mg | ORAL_CAPSULE | Freq: Four times a day (QID) | ORAL | Status: DC | PRN

## 2016-11-12 MED ORDER — ACETAMINOPHEN 325 MG PO TABS: 650.0000 mg | ORAL_TABLET | Freq: Four times a day (QID) | ORAL | Status: DC | PRN

## 2016-11-12 MED ORDER — GABAPENTIN 300 MG PO CAPS
300.0000 mg | ORAL_CAPSULE | Freq: Three times a day (TID) | ORAL | Status: DC
  Administered 2016-11-12 – 2016-11-13 (×4): 300 mg via ORAL
  Filled 2016-11-12 (×4): qty 1

## 2016-11-12 MED ORDER — ENOXAPARIN SODIUM 40 MG/0.4ML ~~LOC~~ SOLN
40.0000 mg | SUBCUTANEOUS | Status: DC
  Administered 2016-11-12: 40 mg via SUBCUTANEOUS
  Filled 2016-11-12: qty 0.4

## 2016-11-12 MED ORDER — SODIUM CHLORIDE 0.9 % IV SOLN
250.0000 mL | INTRAVENOUS | Status: DC | PRN
Start: 2016-11-12 — End: 2016-11-13

## 2016-11-12 MED ORDER — TRAZODONE HCL 100 MG PO TABS
100.0000 mg | ORAL_TABLET | Freq: Every day | ORAL | Status: DC
  Administered 2016-11-12: 100 mg via ORAL
  Filled 2016-11-12: qty 1

## 2016-11-12 MED ORDER — OXYCODONE HCL 5 MG PO TABS
5.0000 mg | ORAL_TABLET | ORAL | Status: DC | PRN
  Administered 2016-11-12 – 2016-11-13 (×3): 5 mg via ORAL
  Filled 2016-11-12 (×3): qty 1

## 2016-11-12 MED ORDER — MORPHINE SULFATE (PF) 2 MG/ML IV SOLN
2.0000 mg | INTRAVENOUS | Status: DC | PRN
  Administered 2016-11-12: 2 mg via INTRAVENOUS
  Filled 2016-11-12: qty 1

## 2016-11-12 MED ORDER — ONDANSETRON HCL 4 MG PO TABS
4.0000 mg | ORAL_TABLET | Freq: Four times a day (QID) | ORAL | Status: DC | PRN
  Administered 2016-11-12: 4 mg via ORAL
  Filled 2016-11-12: qty 1

## 2016-11-12 MED ORDER — PANTOPRAZOLE SODIUM 40 MG PO TBEC
40.0000 mg | DELAYED_RELEASE_TABLET | Freq: Two times a day (BID) | ORAL | Status: DC
  Administered 2016-11-12: 40 mg via ORAL
  Filled 2016-11-12: qty 1

## 2016-11-12 MED ORDER — CLOPIDOGREL BISULFATE 75 MG PO TABS
75.0000 mg | ORAL_TABLET | Freq: Every day | ORAL | Status: DC
Start: 2016-11-12 — End: 2016-11-13
  Administered 2016-11-12 – 2016-11-13 (×3): 75 mg via ORAL
  Filled 2016-11-12 (×3): qty 1

## 2016-11-12 MED ORDER — ACETAMINOPHEN 325 MG PO TABS
650.0000 mg | ORAL_TABLET | Freq: Once | ORAL | Status: AC
  Administered 2016-11-12: 650 mg via ORAL
  Filled 2016-11-12: qty 2

## 2016-11-12 MED ORDER — SODIUM CHLORIDE 0.9% FLUSH
3.0000 mL | Freq: Two times a day (BID) | INTRAVENOUS | Status: DC
  Administered 2016-11-12 (×2): 3 mL via INTRAVENOUS

## 2016-11-12 MED ORDER — ACETAMINOPHEN 650 MG RE SUPP: 650.0000 mg | Freq: Four times a day (QID) | RECTAL | Status: DC | PRN

## 2016-11-12 MED ORDER — ONDANSETRON 4 MG PO TBDP
4.0000 mg | ORAL_TABLET | Freq: Once | ORAL | Status: AC
  Administered 2016-11-12: 4 mg via ORAL
  Filled 2016-11-12: qty 1

## 2016-11-12 MED ORDER — NITROGLYCERIN 0.4 MG SL SUBL
0.4000 mg | SUBLINGUAL_TABLET | SUBLINGUAL | Status: DC | PRN
  Administered 2016-11-12 (×3): 0.4 mg via SUBLINGUAL
  Filled 2016-11-12: qty 1

## 2016-11-12 MED ORDER — SODIUM CHLORIDE 0.9% FLUSH: 3.0000 mL | INTRAVENOUS | Status: DC | PRN

## 2016-11-12 MED ORDER — ONDANSETRON HCL 4 MG/2ML IJ SOLN: 4.0000 mg | Freq: Four times a day (QID) | INTRAMUSCULAR | Status: DC | PRN

## 2016-11-12 MED ORDER — HYDROXYZINE HCL 10 MG PO TABS
10.0000 mg | ORAL_TABLET | Freq: Three times a day (TID) | ORAL | Status: DC | PRN
  Administered 2016-11-12: 10 mg via ORAL
  Filled 2016-11-12 (×3): qty 1

## 2016-11-12 MED ORDER — ASPIRIN 81 MG PO CHEW: 81.0000 mg | CHEWABLE_TABLET | ORAL | Status: DC

## 2016-11-12 MED ORDER — VENLAFAXINE HCL ER 75 MG PO CP24
150.0000 mg | ORAL_CAPSULE | Freq: Every day | ORAL | Status: DC
  Administered 2016-11-12 – 2016-11-13 (×2): 150 mg via ORAL
  Filled 2016-11-12 (×2): qty 2

## 2016-11-12 NOTE — ED Provider Notes (Signed)
[facility] Emergency Department Provider Note   ____________________________________________   First MD Initiated Contact with Patient 11/12/16 [phone]     (approximate)  I have reviewed the triage vital signs and the nursing notes.   HISTORY  Chief Complaint Chest Pain    HPI Meghan Jimenez is a 56 y.o. female who comes into the hospital today with chest pain. The patient reports that this pain started on Friday. She says it is in the left mid chest and it goes down her left arm. She reports that yesterday she felt as though she had been hit in the chest with a brick. The patient got back into the house and reports that she felt shortness of breath. She also felt as though her heart was beating very fast. She thought she was going to pass out and was hurting all day. The patient states it feels like when she had a heart attack in the past. The patient rates her pain a 9 out of 10 in intensity. She reports that she's had some nausea and vomiting as well as dizziness and lightheadedness. The patient states she is also felt sweaty. The patient decided to come into the hospital this evening for further evaluation of her symptoms.   Past Medical History:  Diagnosis Date  . CHF (congestive heart failure) (HCC)   . Cirrhosis (HCC)   . COPD (chronic obstructive pulmonary disease) (HCC)   . Diabetes mellitus without complication (HCC)   . Hepatitis C   . Hypertension     Patient Active Problem List   Diagnosis Date Noted  . PTSD (post-traumatic stress disorder) 12/13/2015  . Dysthymia 12/13/2015  . GIB (gastrointestinal bleeding) 12/10/2015  . Unstable angina (HCC) 11/11/2015    Past Surgical History:  Procedure Laterality Date  . BACK SURGERY    . CARDIAC SURGERY    . ESOPHAGOGASTRODUODENOSCOPY (EGD) WITH PROPOFOL N/A 12/11/2015   Procedure: ESOPHAGOGASTRODUODENOSCOPY (EGD) WITH PROPOFOL;  Surgeon: [name], MD;  Location: [facility] ENDOSCOPY;  Service:  Gastroenterology;  Laterality: N/A;    Prior to Admission medications   Medication Sig Start Date End Date Taking? Authorizing Provider  clopidogrel (PLAVIX) 75 MG tablet Take 75 mg by mouth daily.   Yes [provider]  diphenhydrAMINE (BENADRYL) 50 MG capsule Take 50 mg by mouth every 6 (six) hours as needed.   Yes [provider]  gabapentin (NEURONTIN) 300 MG capsule Take 1 capsule (300 mg total) by mouth 3 (three) times daily. 12/14/15  Yes Auburn Bilberry, MD  hydrOXYzine (ATARAX/VISTARIL) 10 MG tablet Take 1 tablet (10 mg total) by mouth 3 (three) times daily as needed. 12/14/15  Yes Auburn Bilberry, MD  pantoprazole (PROTONIX) 40 MG tablet Take 1 tablet (40 mg total) by mouth 2 (two) times daily. 12/14/15  Yes Auburn Bilberry, MD  traZODone (DESYREL) 100 MG tablet Take 1 tablet (100 mg total) by mouth at bedtime. 12/14/15  Yes Auburn Bilberry, MD  venlafaxine XR (EFFEXOR-XR) 150 MG 24 hr capsule Take 1 capsule (150 mg total) by mouth daily with breakfast. 12/14/15  Yes Auburn Bilberry, MD    Allergies Aspirin; Tramadol; Latex; and Vicodin [hydrocodone-acetaminophen]  Family History  Problem Relation Age of Onset  . CAD Mother   . Diabetes Mother   . Lung cancer Father     Social History Social History  Substance Use Topics  . Smoking status: Current Every Day Smoker    Packs/day: 1.00    Types: Cigarettes  . Smokeless tobacco:  Never Used     Comment: SMOKES 3-4 PKS/DAY  . Alcohol use Yes     Comment: Drinks occasionally every week    Review of Systems  Constitutional: No fever/chills Eyes: No visual changes. ENT: No sore throat. Cardiovascular:  chest pain. Respiratory:  shortness of breath. Gastrointestinal:  abdominal pain, nausea, vomiting.  No diarrhea.  No constipation. Genitourinary: Negative for dysuria. Musculoskeletal: Negative for back pain. Skin: Negative for rash. Neurological: Negative for headaches, focal weakness or  numbness.   ____________________________________________   PHYSICAL EXAM:  VITAL SIGNS: ED Triage Vitals  Enc Vitals Group     BP 11/12/16 0347 (!) 165/90     Pulse Rate 11/12/16 0347 87     Resp 11/12/16 0347 17     Temp 11/12/16 0347 98.5 F (36.9 C)     Temp Source 11/12/16 0347 Oral     SpO2 11/12/16 0347 99 %     Weight 11/12/16 0344 200 lb (90.7 kg)     Height 11/12/16 0344 5\' 4"  (1.626 m)     Head Circumference --      Peak Flow --      Pain Score 11/12/16 0345 9     Pain Loc --      Pain Edu? --      Excl. in GC? --     Constitutional: Alert and oriented. Well appearing and in Mild distress. Eyes: Conjunctivae are normal. PERRL. EOMI. Head: Atraumatic. Nose: No congestion/rhinnorhea. Mouth/Throat: Mucous membranes are moist.  Oropharynx non-erythematous. Cardiovascular: Normal rate, regular rhythm. Grossly normal heart sounds.  Good peripheral circulation. Respiratory: Normal respiratory effort.  No retractions. Lungs CTAB. Gastrointestinal: Soft with some mid abdominal tenderness to palpation. No distention. Positive bowel sounds Musculoskeletal: No lower extremity tenderness nor edema.   Neurologic:  Normal speech and language.  Skin:  Skin is warm, dry and intact.  Psychiatric: Mood and affect are normal.   ____________________________________________   LABS (all labs ordered are listed, but only abnormal results are displayed)  Labs Reviewed  BASIC METABOLIC PANEL - Abnormal; Notable for the following:       Result Value   Glucose, Bld 101 (*)    All other components within normal limits  CBC - Abnormal; Notable for the following:    RBC 5.49 (*)    Hemoglobin 10.7 (*)    HCT 33.7 (*)    MCV 61.4 (*)    MCH 19.5 (*)    MCHC 31.7 (*)    RDW 20.8 (*)    Platelets 90 (*)    All other components within normal limits  TROPONIN I  TROPONIN I   ____________________________________________  EKG  ED ECG REPORT I, [name], the  attending physician, personally viewed and interpreted this ECG.   Date: 11/12/2016  EKG Time: 345  Rate: 86  Rhythm: normal sinus rhythm, RBBB  Axis: normal  Intervals:right bundle branch block  ST&T Change: n flipped T waves in leads V3  ____________________________________________  RADIOLOGY  Dg Chest 2 View  Result Date: 11/12/2016 CLINICAL DATA:  56 year old female with chest pain. EXAM: CHEST  2 VIEW COMPARISON:  Chest radiograph dated 06/22/2016 FINDINGS: The lungs are clear. There is no pleural effusion or pneumothorax. The cardiac silhouette is within normal limits. No acute osseous pathology. Lower cervical anterior fusion hardware. IMPRESSION: No active cardiopulmonary disease. Electronically Signed   By: Elgie Collard M.D.   On: 11/12/2016 04:33    ____________________________________________   PROCEDURES  Procedure(s) performed:  None  Procedures  Critical Care performed: No  ____________________________________________   INITIAL IMPRESSION / ASSESSMENT AND PLAN / ED COURSE  Pertinent labs & imaging results that were available during my care of the patient were reviewed by me and considered in my medical decision making (see chart for details).  This is a 56 year old female who comes into the hospital today with some chest pain. She's also had some shortness of breath dizziness nausea and vomiting. The patient has been having the pain for multiple days. I will give the patient some nitroglycerin. She is allergic to aspirin so I'm unable to give her aspirin at this time. The patient's platelets are 90 and her initial blood work is unremarkable. I will repeat the patient's troponin and 3 hours.     After 3 nitroglycerin the patient's pain is a 0 out of 10 in intensity. Due to the patient's history I will admit her for further evaluattion.  ____________________________________________   FINAL CLINICAL IMPRESSION(S) / ED DIAGNOSES  Final diagnoses:  Chest  pain, unspecified type      NEW MEDICATIONS STARTED DURING THIS VISIT:  New Prescriptions   No medications on file     Note:  This document was prepared using Dragon voice recognition software and may include unintentional dictation errors.    Rebecka ApleyWebster, Jadah Bobak P, MD 11/12/16 [id number]

## 2016-11-12 NOTE — ED Triage Notes (Signed)
Pt assisted to wheelchair upon arrival; c/o left sided chest pain, stabbing sensation; radiates down left arm and through to back; history of MI x 1 and says this feels similar;

## 2016-11-12 NOTE — Consult Note (Signed)
[facility] Cardiology Consultation Note  Patient ID: Meghan PrudeSherry N Spanos, MRN: [id number], DOB/AGE: 1960/10/11 56 y.o. Admit date: 11/12/2016   Date of Consult: 11/12/2016 Primary Physician: [facility] Primary Cardiologist: [facility]  Chief Complaint:  Chief Complaint  Patient presents with  . Chest Pain   Reason for Consult: chest pain  HPI: 56 y.o. female with known coronary artery disease status post previous stent placement and apparent non-ST elevation myocardial infarction in the remote past with cirrhosis of the litter chronic obstructive pulmonary disease diabetes with complications hepatitis C and essential hypertension with mixed hyperlipidemia. The patient has had significant progression of shortness of breath with physical activity chest pressure radiating into her left arm when doing physical activity including walking from the mailbox. This progressive over the last several weeks and no significant to the point where she had some resting pain last night. She came to the emergency room with an EKG showing normal sinus rhythm and normal troponin without evidence of myocardial infarction. She is on appropriate medication management this time with dual antiplatelet therapy with no further symptoms  Past Medical History:  Diagnosis Date  . CAD (coronary artery disease)   . CHF (congestive heart failure) (HCC)   . Cirrhosis (HCC)   . COPD (chronic obstructive pulmonary disease) (HCC)   . Diabetes mellitus without complication (HCC)   . Hepatitis C   . Hypertension       Surgical History:  Past Surgical History:  Procedure Laterality Date  . BACK SURGERY    . CARDIAC SURGERY    . ESOPHAGOGASTRODUODENOSCOPY (EGD) WITH PROPOFOL N/A 12/11/2015   Procedure: ESOPHAGOGASTRODUODENOSCOPY (EGD) WITH PROPOFOL;  Surgeon: Lacey JensenSteven Solik, MD;  [facility] ENDOSCOPY;  Service: Gastroenterology;  Laterality: N/A;     Home Meds: Prior to Admission medications    Medication Sig Start Date End Date Taking? Authorizing Provider  clopidogrel (PLAVIX) 75 MG tablet Take 75 mg by mouth daily.   Yes [provider]  diphenhydrAMINE (BENADRYL) 50 MG capsule Take 50 mg by mouth every 6 (six) hours as needed.   Yes [provider]  gabapentin (NEURONTIN) 300 MG capsule Take 1 capsule (300 mg total) by mouth 3 (three) times daily. 12/14/15  Yes Auburn BilberryPatel, Shreyang, MD  hydrOXYzine (ATARAX/VISTARIL) 10 MG tablet Take 1 tablet (10 mg total) by mouth 3 (three) times daily as needed. 12/14/15  Yes Auburn BilberryPatel, Shreyang, MD  pantoprazole (PROTONIX) 40 MG tablet Take 1 tablet (40 mg total) by mouth 2 (two) times daily. 12/14/15  Yes Auburn BilberryPatel, Shreyang, MD  traZODone (DESYREL) 100 MG tablet Take 1 tablet (100 mg total) by mouth at bedtime. 12/14/15  Yes Auburn BilberryPatel, Shreyang, MD  venlafaxine XR (EFFEXOR-XR) 150 MG 24 hr capsule Take 1 capsule (150 mg total) by mouth daily with breakfast. 12/14/15  Yes Auburn BilberryPatel, Shreyang, MD    Inpatient Medications:  . clopidogrel  75 mg Oral Daily  . enoxaparin (LOVENOX) injection  40 mg Subcutaneous Q24H  . gabapentin  300 mg Oral TID  . pantoprazole  40 mg Oral BID  . [START ON 11/13/2016] pneumococcal 23 valent vaccine  0.5 mL Intramuscular Tomorrow-1000  . sodium chloride flush  3 mL Intravenous Q12H  . traZODone  100 mg Oral QHS  . venlafaxine XR  150 mg Oral Q breakfast   . sodium chloride      Allergies:  Allergies  Allergen Reactions  . Aspirin Anaphylaxis and Palpitations    Patient takes ibuprofen as home med, so no problem with  NSAIDs  . Tramadol Anaphylaxis  . Latex   . Vicodin [Hydrocodone-Acetaminophen] Palpitations    Social History   Social History  . Marital status: Widowed    Spouse name: N/A  . Number of children: N/A  . Years of education: N/A   Occupational History  . Not on file.   Social History Main Topics  . Smoking status: Current Every Day Smoker    Packs/day: 1.00    Types: Cigarettes  .  Smokeless tobacco: Never Used     Comment: SMOKES 3-4 PKS/DAY  . Alcohol use Yes     Comment: Drinks occasionally every week  . Drug use: Yes    Types: "Crack" cocaine  . Sexual activity: No   Other Topics Concern  . Not on file   Social History Narrative  . No narrative on file     Family History  Problem Relation Age of Onset  . CAD Mother   . Diabetes Mother   . Lung cancer Father      Review of Systems Positive for Shortness of breath chest pain Negative for: General:  chills, fever, night sweats or weight changes.  Cardiovascular: PND orthopnea syncope dizziness  Dermatological skin lesions rashes Respiratory: Cough congestion Urologic: Frequent urination urination at night and hematuria Abdominal: negative for nausea, vomiting, diarrhea, bright red blood per rectum, melena, or hematemesis Neurologic: negative for visual changes, and/or hearing changes  All other systems reviewed and are otherwise negative except as noted above.  Labs:  Recent Labs  11/12/16 0437 11/12/16 0653 11/12/16 1249  TROPONINI <0.03 <0.03 <0.03   Lab Results  Component Value Date   WBC 5.7 11/12/2016   HGB 10.7 (L) 11/12/2016   HCT 33.7 (L) 11/12/2016   MCV 61.4 (L) 11/12/2016   PLT 90 (L) 11/12/2016    Recent Labs Lab 11/12/16 0437  NA 136  K 3.6  CL 103  CO2 23  BUN 11  CREATININE 0.73  CALCIUM 10.3  GLUCOSE 101*   Lab Results  Component Value Date   CHOL 167 11/12/2015   HDL 32 (L) 11/12/2015   LDLCALC 112 (H) 11/12/2015   TRIG 113 11/12/2015   No results found for: DDIMER  Radiology/Studies:  Dg Chest 2 View  Result Date: 11/12/2016 CLINICAL DATA:  56 year old female with chest pain. EXAM: CHEST  2 VIEW COMPARISON:  Chest radiograph dated 06/22/2016 FINDINGS: The lungs are clear. There is no pleural effusion or pneumothorax. The cardiac silhouette is within normal limits. No acute osseous pathology. Lower cervical anterior fusion hardware. IMPRESSION: No  active cardiopulmonary disease. Electronically Signed   By: Elgie Collard M.D.   On: 11/12/2016 04:33    EKG: Normal sinus rhythm. Otherwise normal EKG  Weights: Filed Weights   11/12/16 0344  Weight: 90.7 kg (200 lb)     Physical Exam: Blood pressure 139/67, pulse 84, temperature 98.5 F (36.9 C), temperature source Oral, resp. rate 18, height 5\' 4"  (1.626 m), weight 90.7 kg (200 lb), SpO2 96 %. Body mass index is 34.33 kg/m. General: Well developed, well nourished, in no acute distress. Head eyes ears nose throat: Normocephalic, atraumatic, sclera non-icteric, no xanthomas, nares are without discharge. No apparent thyromegaly and/or mass  Lungs: Normal respiratory effort.  no wheezes, no rales, no rhonchi.  Heart: RRR with normal S1 S2. no murmur gallop, no rub, PMI is normal size and placement, carotid upstroke normal without bruit, jugular venous pressure is normal Abdomen: Soft, non-tender, non-distended with normoactive bowel sounds. No hepatomegaly.  No rebound/guarding. No obvious abdominal masses. Abdominal aorta is normal size without bruit Extremities: No edema. no cyanosis, no clubbing, no ulcers  Peripheral : 2+ bilateral upper extremity pulses, 2+ bilateral femoral pulses, 2+ bilateral dorsal pedal pulse Neuro: Alert and oriented. No facial asymmetry. No focal deficit. Moves all extremities spontaneously. Musculoskeletal: Normal muscle tone without kyphosis Psych:  Responds to questions appropriately with a normal affect.    Assessment: 56 year old female with unstable angina without evidence of myocardial infarction and known coronary artery disease status post previous PCI and stent placement diabetes with complication essential hypertension and mixed hyperlipidemia any further treatment options  Plan: 1. Continue the heparin for further risk reduction possible myocardial infarction 2. Dual antiplatelet therapy for further risk reduction of myocardial infarction 3.  Proceed to cardiac catheterization to assess coronary anatomy and further treatment thereof is necessary. Patient understands risk and benefits of cardiac catheterization. This includes a possibility does stroke heart attack infection bleeding or blood clot. She is at low risk for conscious sedation  Signed, [name] M.D. [facility] Cardiology 11/12/2016, 6:47 PM
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
## 2016-11-12 NOTE — Plan of Care (Signed)
Problem: Pain Managment: Goal: General experience of comfort will improve Outcome: Not Progressing Patient c/o headache and anxiety. PRN meds given, see MAR. Patient was in tears earlier and states that she is having bad anxiety d/t loss of family member.

## 2016-11-12 NOTE — H&P (Signed)
[employer] -  at [facility]   PATIENT NAME: Meghan Jimenez    MR#:  [id number]  DATE OF BIRTH:  May 15, 1960  DATE OF ADMISSION:  11/12/2016  PRIMARY CARE PHYSICIAN: [facility]   REQUESTING/REFERRING PHYSICIAN:   Lucrezia Europe MD  CHIEF COMPLAINT:   Chief Complaint  Patient presents with  . Chest Pain    HISTORY OF PRESENT ILLNESS: Meghan Jimenez  is a 56 y.o. female with a known history of Stent according to her as well as congestive heart failure, cirrhosis, COPD, diabetes type 2, hepatitis C, essential hypertension who is presenting to the emergency room with complaint of having headache and chest pain. She describes the chest pain is substernal in nature associated with pressure-like sensation on her chest. She reports that this is been going on and off since Friday. She called her cardiologist Dr. Ria Clock office they recommended she come to the hospital. Patient also has been having nausea vomiting. She complains of a headache. She denies any shortness of breath. She does report of radiation of the pain down her left arm.  PAST MEDICAL HISTORY:   Past Medical History:  Diagnosis Date  . CAD (coronary artery disease)   . CHF (congestive heart failure) (HCC)   . Cirrhosis (HCC)   . COPD (chronic obstructive pulmonary disease) (HCC)   . Diabetes mellitus without complication (HCC)   . Hepatitis C   . Hypertension     PAST SURGICAL HISTORY: Past Surgical History:  Procedure Laterality Date  . BACK SURGERY    . CARDIAC SURGERY    . ESOPHAGOGASTRODUODENOSCOPY (EGD) WITH PROPOFOL N/A 12/11/2015   Procedure: ESOPHAGOGASTRODUODENOSCOPY (EGD) WITH PROPOFOL;  Surgeon: [name], MD;  Location: [facility] ENDOSCOPY;  Service: Gastroenterology;  Laterality: N/A;    SOCIAL HISTORY:  Social History  Substance Use Topics  . Smoking status: Current Every Day Smoker    Packs/day: 1.00    Types: Cigarettes  . Smokeless tobacco: Never Used      Comment: SMOKES 3-4 PKS/DAY  . Alcohol use Yes     Comment: Drinks occasionally every week    FAMILY HISTORY:  Family History  Problem Relation Age of Onset  . CAD Mother   . Diabetes Mother   . Lung cancer Father     DRUG ALLERGIES:  Allergies  Allergen Reactions  . Aspirin Anaphylaxis and Palpitations    Patient takes ibuprofen as home med, so no problem with NSAIDs  . Tramadol Anaphylaxis  . Latex   . Vicodin [Hydrocodone-Acetaminophen] Palpitations    REVIEW OF SYSTEMS:   CONSTITUTIONAL: No fever, fatigue or weakness.  EYES: No blurred or double vision.  EARS, NOSE, AND THROAT: No tinnitus or ear pain.  RESPIRATORY: No cough, shortness of breath, wheezing or hemoptysis.  CARDIOVASCULAR: Positive chest pain, orthopnea, edema.  GASTROINTESTINAL: Positive nausea and vomiting, no diarrhea or abdominal pain.  GENITOURINARY: No dysuria, hematuria.  ENDOCRINE: No polyuria, nocturia,  HEMATOLOGY: No anemia, easy bruising or bleeding SKIN: No rash or lesion. MUSCULOSKELETAL: No joint pain or arthritis.   NEUROLOGIC: No tingling, numbness, weakness.  PSYCHIATRY: No anxiety or depression.   MEDICATIONS AT HOME:  Prior to Admission medications   Medication Sig Start Date End Date Taking? Authorizing Provider  clopidogrel (PLAVIX) 75 MG tablet Take 75 mg by mouth daily.   Yes [provider]  diphenhydrAMINE (BENADRYL) 50 MG capsule Take 50 mg by mouth every 6 (six) hours as needed.   Yes [provider]  gabapentin (  NEURONTIN) 300 MG capsule Take 1 capsule (300 mg total) by mouth 3 (three) times daily. 12/14/15  Yes Auburn BilberryPatel, Aviel Davalos, MD  hydrOXYzine (ATARAX/VISTARIL) 10 MG tablet Take 1 tablet (10 mg total) by mouth 3 (three) times daily as needed. 12/14/15  Yes Auburn BilberryPatel, Sibley Rolison, MD  pantoprazole (PROTONIX) 40 MG tablet Take 1 tablet (40 mg total) by mouth 2 (two) times daily. 12/14/15  Yes Auburn BilberryPatel, Alara Daniel, MD  traZODone (DESYREL) 100 MG tablet Take 1 tablet (100  mg total) by mouth at bedtime. 12/14/15  Yes Auburn BilberryPatel, Tajae Rybicki, MD  venlafaxine XR (EFFEXOR-XR) 150 MG 24 hr capsule Take 1 capsule (150 mg total) by mouth daily with breakfast. 12/14/15  Yes Auburn BilberryPatel, Dailin Sosnowski, MD      PHYSICAL EXAMINATION:   VITAL SIGNS: Blood pressure 127/66, pulse 95, temperature 98.5 F (36.9 C), temperature source Oral, resp. rate 18, height 5\' 4"  (1.626 m), weight 200 lb (90.7 kg), SpO2 100 %.  GENERAL:  56 y.o.-year-old patient lying in the bed with no acute distress.  EYES: Pupils equal, round, reactive to light and accommodation. No scleral icterus. Extraocular muscles intact.  HEENT: Head atraumatic, normocephalic. Oropharynx and nasopharynx clear.  NECK:  Supple, no jugular venous distention. No thyroid enlargement, no tenderness.  LUNGS: Normal breath sounds bilaterally, no wheezing, rales,rhonchi or crepitation. No use of accessory muscles of respiration.  CARDIOVASCULAR: S1, S2 normal. No murmurs, rubs, or gallops.  ABDOMEN: Soft, nontender, nondistended. Bowel sounds present. No organomegaly or mass.  EXTREMITIES: No pedal edema, cyanosis, or clubbing.  NEUROLOGIC: Cranial nerves II through XII are intact. Muscle strength 5/5 in all extremities. Sensation intact. Gait not checked.  PSYCHIATRIC: The patient is alert and oriented x 3.  SKIN: No obvious rash, lesion, or ulcer.   LABORATORY PANEL:   CBC  Recent Labs Lab 11/12/16 0437  WBC 5.7  HGB 10.7*  HCT 33.7*  PLT 90*  MCV 61.4*  MCH 19.5*  MCHC 31.7*  RDW 20.8*   ------------------------------------------------------------------------------------------------------------------  Chemistries   Recent Labs Lab 11/12/16 0437  NA 136  K 3.6  CL 103  CO2 23  GLUCOSE 101*  BUN 11  CREATININE 0.73  CALCIUM 10.3   ------------------------------------------------------------------------------------------------------------------ estimated creatinine clearance is 84.6 mL/min (by C-G formula based  on SCr of 0.73 mg/dL). ------------------------------------------------------------------------------------------------------------------ No results for input(s): TSH, T4TOTAL, T3FREE, THYROIDAB in the last 72 hours.  Invalid input(s): FREET3   Coagulation profile No results for input(s): INR, PROTIME in the last 168 hours. ------------------------------------------------------------------------------------------------------------------- No results for input(s): DDIMER in the last 72 hours. -------------------------------------------------------------------------------------------------------------------  Cardiac Enzymes  Recent Labs Lab 11/12/16 0437 11/12/16 0653  TROPONINI <0.03 <0.03   ------------------------------------------------------------------------------------------------------------------ Invalid input(s): POCBNP  ---------------------------------------------------------------------------------------------------------------  Urinalysis    Component Value Date/Time   COLORURINE YELLOW (A) 08/24/2016 0140   APPEARANCEUR CLOUDY (A) 08/24/2016 0140   LABSPEC 1.004 (L) 08/24/2016 0140   PHURINE 6.0 08/24/2016 0140   GLUCOSEU NEGATIVE 08/24/2016 0140   HGBUR NEGATIVE 08/24/2016 0140   BILIRUBINUR NEGATIVE 08/24/2016 0140   KETONESUR NEGATIVE 08/24/2016 0140   PROTEINUR NEGATIVE 08/24/2016 0140   NITRITE NEGATIVE 08/24/2016 0140   LEUKOCYTESUR LARGE (A) 08/24/2016 0140     RADIOLOGY: Dg Chest 2 View  Result Date: 11/12/2016 CLINICAL DATA:  56 year old female with chest pain. EXAM: CHEST  2 VIEW COMPARISON:  Chest radiograph dated 06/22/2016 FINDINGS: The lungs are clear. There is no pleural effusion or pneumothorax. The cardiac silhouette is within normal limits. No acute osseous pathology. Lower cervical anterior fusion hardware. IMPRESSION: No active  cardiopulmonary disease. Electronically Signed   By: Elgie Collard M.D.   On: 11/12/2016 04:33     EKG: Orders placed or performed during the hospital encounter of 11/12/16  . ED EKG within 10 minutes  . ED EKG within 10 minutes    IMPRESSION AND PLAN: Patient is a 56 year old with history of coronary artery disease presents with chest pain  1. Chest pain with negative EKG and troponins Patient had a stress echo last year so I will not repeat I will ask her cardiologist to see regarding further recommendations She is allergic to aspirin continue Plavix Check a fasting lipid panel in the morning Nitroglycerin when necessary Use when necessary morphine for pain trend cardiac enzymes further  2. Diabetes type 2 currently not on any medications will place on sliding scale insulin check a hemoglobin A1c  3. 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 COPD without exacerbation continue home regimen  5. History of congestive heart failure however reviewing her echo from 2017 showed normal EF and no diastolic dysfunction   6. Miscellaneous Lovenox for DVT prophylaxis monitor with platelet count being low  7. Nicotine abuse smoking cessation provided I recommended she stop smoking and offered her nicotine patch she states that she cannot take nicotine replacements gives her palpitations--- spent in counseling    All the records are reviewed and case discussed with ED provider. Management plans discussed with the patient, family and they are in agreement.  CODE STATUS: Code Status History    Date Active Date Inactive Code Status Order ID Comments User Context   12/10/2015  2:55 PM 12/14/2015  3:40 PM Full Code [id number]  Erin Fulling, MD ED   11/11/2015  6:38 PM 11/12/2015  9:01 PM Full Code [id number]  Alford Highland, MD ED       TOTAL TIME TAKING CARE OF THIS PATIENT: 55 minutes.    Auburn Bilberry M.D on 11/12/2016 at 8:35 AM  Between 7am to 6pm - Pager - [phone]  After 6pm go to [email or web address] - password [email or web address]  [facility]  [place]  [facility]  Office  [phone]  CC: Primary care physician; [facility] [employer]

## 2016-11-13 ENCOUNTER — Encounter
Admission: EM | Disposition: A | Discharge status: Other Acute Inpatient Hospital | Payer: Self-pay | Source: Home / Self Care | Attending: Emergency Medicine

## 2016-11-13 ENCOUNTER — Encounter: Payer: Self-pay | Admitting: Internal Medicine

## 2016-11-13 HISTORY — PX: LEFT HEART CATH AND CORONARY ANGIOGRAPHY: CATH118249

## 2016-11-13 LAB — LIPID PANEL
CHOLESTEROL: 158 mg/dL (ref 0–200)
HDL: 26 mg/dL — AB (ref >40)
LDL CALC: 96 mg/dL (ref 0–99)
TRIGLYCERIDES: 181 mg/dL — AB (ref <150)
Total CHOL/HDL Ratio: 6.1 RATIO
VLDL: 36 mg/dL (ref 0–40)

## 2016-11-13 LAB — BASIC METABOLIC PANEL
ANION GAP: 7 (ref 5–15)
BUN: 14 mg/dL (ref 6–20)
CALCIUM: 8.9 mg/dL (ref 8.9–10.3)
CHLORIDE: 105 mmol/L (ref 101–111)
CO2: 24 mmol/L (ref 22–32)
Creatinine, Ser: 0.87 mg/dL (ref 0.44–1.00)
GFR calc non Af Amer: >60 mL/min (ref >60)
Glucose, Bld: 100 mg/dL — ABNORMAL HIGH (ref 65–99)
Potassium: 4.1 mmol/L (ref 3.5–5.1)
SODIUM: 136 mmol/L (ref 135–145)

## 2016-11-13 LAB — CBC
HEMATOCRIT: 32 % — AB (ref 35.0–47.0)
HEMOGLOBIN: 10.1 g/dL — AB (ref 12.0–16.0)
MCH: 19.5 pg — ABNORMAL LOW (ref 26.0–34.0)
MCHC: 31.6 g/dL — ABNORMAL LOW (ref 32.0–36.0)
MCV: 61.8 fL — ABNORMAL LOW (ref 80.0–100.0)
Platelets: 112 10*3/uL — ABNORMAL LOW (ref 150–440)
RBC: 5.18 MIL/uL (ref 3.80–5.20)
RDW: 20.8 % — AB (ref 11.5–14.5)
WBC: 8.3 10*3/uL (ref 3.6–11.0)

## 2016-11-13 LAB — PROTIME-INR
INR: 1.16
Prothrombin Time: 14.9 seconds (ref 11.4–15.2)

## 2016-11-13 SURGERY — LEFT HEART CATH AND CORONARY ANGIOGRAPHY
Anesthesia: Moderate Sedation

## 2016-11-13 MED ORDER — ACETAMINOPHEN 325 MG PO TABS: 650.0000 mg | ORAL_TABLET | ORAL | Status: DC | PRN

## 2016-11-13 MED ORDER — FENTANYL CITRATE (PF) 100 MCG/2ML IJ SOLN
INTRAMUSCULAR | Status: AC
  Filled 2016-11-13: qty 2

## 2016-11-13 MED ORDER — ONDANSETRON HCL 4 MG/2ML IJ SOLN: 4.0000 mg | Freq: Four times a day (QID) | INTRAMUSCULAR | Status: DC | PRN

## 2016-11-13 MED ORDER — SODIUM CHLORIDE 0.9 % IV SOLN: 250.0000 mL | INTRAVENOUS | Status: DC | PRN

## 2016-11-13 MED ORDER — SODIUM CHLORIDE 0.9 % WEIGHT BASED INFUSION: 1.0000 mL/kg/h | INTRAVENOUS | Status: DC

## 2016-11-13 MED ORDER — MIDAZOLAM HCL 2 MG/2ML IJ SOLN
INTRAMUSCULAR | Status: DC | PRN
  Administered 2016-11-13: 1 mg via INTRAVENOUS

## 2016-11-13 MED ORDER — SODIUM CHLORIDE 0.9 % WEIGHT BASED INFUSION: 3.0000 mL/kg/h | INTRAVENOUS | Status: DC

## 2016-11-13 MED ORDER — SODIUM CHLORIDE 0.9% FLUSH: 3.0000 mL | Freq: Two times a day (BID) | INTRAVENOUS | Status: DC

## 2016-11-13 MED ORDER — FENTANYL CITRATE (PF) 100 MCG/2ML IJ SOLN
INTRAMUSCULAR | Status: DC | PRN
  Administered 2016-11-13 (×2): 25 ug via INTRAVENOUS

## 2016-11-13 MED ORDER — SODIUM CHLORIDE 0.9% FLUSH: 3.0000 mL | INTRAVENOUS | Status: DC | PRN

## 2016-11-13 MED ORDER — MIDAZOLAM HCL 2 MG/2ML IJ SOLN
INTRAMUSCULAR | Status: AC
  Filled 2016-11-13: qty 2

## 2016-11-13 MED ORDER — IOPAMIDOL (ISOVUE-300) INJECTION 61%
INTRAVENOUS | Status: DC | PRN
  Administered 2016-11-13: 180 mL via INTRA_ARTERIAL

## 2016-11-13 SURGICAL SUPPLY — 11 items
CATH 5FR JL4 DIAGNOSTIC (CATHETERS) ×2 IMPLANT
CATH INFINITI 5FR ANG PIGTAIL (CATHETERS) ×2 IMPLANT
CATH INFINITI JR4 5F (CATHETERS) ×2 IMPLANT
DEVICE CLOSURE MYNXGRIP 5F (Vascular Products) ×2 IMPLANT
KIT MANI 3VAL PERCEP (MISCELLANEOUS) ×3 IMPLANT
KIT TRANSPAC II SGL 4260605 (MISCELLANEOUS) IMPLANT
NDL PERC 18GX7CM (NEEDLE) IMPLANT
NEEDLE PERC 18GX7CM (NEEDLE) ×3 IMPLANT
PACK CARDIAC CATH (CUSTOM PROCEDURE TRAY) ×3 IMPLANT
SHEATH PINNACLE 5F 10CM (SHEATH) ×2 IMPLANT
WIRE EMERALD 3MM-J .035X150CM (WIRE) ×2 IMPLANT

## 2016-11-13 NOTE — Progress Notes (Signed)
Pt discharged [facility]. Tele removed.

## 2016-11-13 NOTE — Care Management Obs Status (Signed)
MEDICARE OBSERVATION STATUS NOTIFICATION   Patient Details  Name: Meghan Jimenez MRN: [id number] Date of Birth: 09-22-1960   Medicare Observation Status Notification Given:  Yes    Eber HongGreene, Jerimiah Wolman R, RN 11/13/2016, 5:00 PM
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
## 2016-11-13 NOTE — Progress Notes (Signed)
Pt to be [facility] to [facility] for [facility]. She is to go to room 7108, [address], [place] [address]. Report given to hailey at the facility. Transport to be provided by [facility].

## 2016-11-13 NOTE — Progress Notes (Signed)
[facility]  Patient: Meghan Jimenez / Admit Date: 11/12/2016 / Date of Encounter: 11/13/2016, 10:15 AM   Subjective: No further chest pain overnight. Patient's EKG has not changed. Patient on appropriate medication management with an tolerating well Cardiac catheterization showing normal LV systolic function of with nondominant right coronary artery which is normal and a significant 85-90% of proximal LAD stenosis at the site of a very large diagonal artery  Review of Systems: Positive for: Shortness of breath Negative for: Vision change, hearing change, syncope, dizziness, nausea, vomiting,diarrhea, bloody stool, stomach pain, cough, congestion, diaphoresis, urinary frequency, urinary pain,skin lesions, skin rashes Others previously listed  Objective: Telemetry: Normal sinus rhythm Physical Exam: Blood pressure 128/62, pulse 81, temperature 98.3 F (36.8 C), temperature source Oral, resp. rate 17, height 5\' 4"  (1.626 m), weight 88.4 kg (194 lb 12.8 oz), SpO2 97 %. Body mass index is 33.44 kg/m. General: Well developed, well nourished, in no acute distress. Head: Normocephalic, atraumatic, sclera non-icteric, no xanthomas, nares are without discharge. Neck: No apparent masses Lungs: Normal respirations with no wheezes, no rhonchi, no rales , no crackles   Heart: Regular rate and rhythm, normal S1 S2, no murmur, no rub, no gallop, PMI is normal size and placement, carotid upstroke normal without bruit, jugular venous pressure normal Abdomen: Soft, non-tender, non-distended with normoactive bowel sounds. No hepatosplenomegaly. Abdominal aorta is normal size without bruit Extremities: No edema, no clubbing, no cyanosis, no ulcers,  Peripheral: 2+ radial, 2+ femoral, 2+ dorsal pedal pulses Neuro: Alert and oriented. Moves all extremities spontaneously. Psych:  Responds to questions appropriately with a normal affect.   Intake/Output Summary (Last 24  hours) at 11/13/16 1015 Last data filed at 11/13/16 0554  Gross per 24 hour  Intake              240 ml  Output             1200 ml  Net             -960 ml    Inpatient Medications:  . [MAR Hold] clopidogrel  75 mg Oral Daily  . [MAR Hold] enoxaparin (LOVENOX) injection  40 mg Subcutaneous Q24H  . [MAR Hold] gabapentin  300 mg Oral TID  . [MAR Hold] pantoprazole  40 mg Oral BID  . [MAR Hold] pneumococcal 23 valent vaccine  0.5 mL Intramuscular Tomorrow-1000  . [MAR Hold] sodium chloride flush  3 mL Intravenous Q12H  . [MAR Hold] traZODone  100 mg Oral QHS  . [MAR Hold] venlafaxine XR  150 mg Oral Q breakfast   Infusions:  . [MAR Hold] sodium chloride      Labs:  Recent Labs  11/12/16 0437 11/13/16 0555  NA 136 136  K 3.6 4.1  CL 103 105  CO2 23 24  GLUCOSE 101* 100*  BUN 11 14  CREATININE 0.73 0.87  CALCIUM 10.3 8.9   No results for input(s): AST, ALT, ALKPHOS, BILITOT, PROT, ALBUMIN in the last 72 hours.  Recent Labs  11/12/16 0437 11/13/16 0555  WBC 5.7 8.3  HGB 10.7* 10.1*  HCT 33.7* 32.0*  MCV 61.4* 61.8*  PLT 90* 112*    Recent Labs  11/12/16 0437 11/12/16 0653 11/12/16 1249 11/12/16 1845  TROPONINI <0.03 <0.03 <0.03 <0.03   Invalid input(s): POCBNP No results for input(s): HGBA1C in the last 72 hours.   Weights: Filed Weights   11/12/16 0344 11/13/16 0549  Weight: 90.7 kg (200 lb) 88.4 kg (194  lb 12.8 oz)     Radiology/Studies:  Dg Chest 2 View  Result Date: 11/12/2016 CLINICAL DATA:  56 year old female with chest pain. EXAM: CHEST  2 VIEW COMPARISON:  Chest radiograph dated 06/22/2016 FINDINGS: The lungs are clear. There is no pleural effusion or pneumothorax. The cardiac silhouette is within normal limits. No acute osseous pathology. Lower cervical anterior fusion hardware. IMPRESSION: No active cardiopulmonary disease. Electronically Signed   By: Elgie Collard M.D.   On: 11/12/2016 04:33     Assessment and Recommendation  56 y.o.  female with known essential hypertension mixed hyperlipidemia and previous coronary artery disease with unstable angina and no current evidence of myocardial infarction Catheter with the significant stenosis of left anterior descending artery 1. Continue medication management for unstable angina 2. Consideration of PCI and stent placement of left anterior descending artery at the first diagonal side  Signed, Arnoldo Hooker M.D. FACC

## 2016-11-13 NOTE — Discharge Summary (Signed)
[facility] [place] [facility]  Meghan Jimenez, 56 y.o., DOB [birth year], MRN [id number]. Admission date: 11/12/2016 Discharge Date 11/13/2016 [facility] Admitting Physician Auburn BilberryShreyang Yerachmiel Spinney, MD  Admission Diagnosis  Chest pain, unspecified type [R07.9]  Discharge Diagnosis   Active Problems: Chest pain due to severe single-vessel coronary artery disease Essential hypertension Hepatitis C Diabetes type 2 COPD History of congestive heart Coronary artery disease          Hospital Course Meghan BurkittSherry Jimenez  is a 56 y.o. female with a known history of Stent according to her as well as congestive heart failure, cirrhosis, COPD, diabetes type 2, hepatitis C, essential hypertension who is presenting to the emergency room with complaint of having headache and chest pain. Pt has hx of cardiac intervention in past. Who was seen by cardiology and underwent Cardiac cath which showed single vessel disease which not  amendable to stent, or angioplasty. Cardiology recommended CABG , Dr. Maryclare BeanKowalwski arranging for her transfer.             Consults  cardiology  Significant Tests:  See full reports for all details     Dg Chest 2 View  Result Date: 11/12/2016 CLINICAL DATA:  56 year old female with chest pain. EXAM: CHEST  2 VIEW COMPARISON:  Chest radiograph dated 06/22/2016 FINDINGS: The lungs are clear. There is no pleural effusion or pneumothorax. The cardiac silhouette is within normal limits. No acute osseous pathology. Lower cervical anterior fusion hardware. IMPRESSION: No active cardiopulmonary disease. Electronically Signed   By: Elgie CollardArash  Radparvar M.D.   On: 11/12/2016 04:33       Today   Subjective:   Meghan Jimenez  Objective:   Blood pressure 118/64, pulse 81, temperature 98 F (36.7 C), resp. rate 16, height 5\' 4"  (1.626 m), weight 194 lb 12.8 oz (88.4 kg), SpO2 94 %.  .  Intake/Output Summary (Last 24  hours) at 11/13/16 1450 Last data filed at 11/13/16 0554  Gross per 24 hour  Intake                0 ml  Output             1200 ml  Net            -1200 ml    Exam VITAL SIGNS: Blood pressure 118/64, pulse 81, temperature 98 F (36.7 C), resp. rate 16, height 5\' 4"  (1.626 m), weight 194 lb 12.8 oz (88.4 kg), SpO2 94 %.  GENERAL:  56 y.o.-year-old patient lying in the bed with no acute distress.  EYES: Pupils equal, round, reactive to light and accommodation. No scleral icterus. Extraocular muscles intact.  HEENT: Head atraumatic, normocephalic. Oropharynx and nasopharynx clear.  NECK:  Supple, no jugular venous distention. No thyroid enlargement, no tenderness.  LUNGS: Normal breath sounds bilaterally, no wheezing, rales,rhonchi or crepitation. No use of accessory muscles of respiration.  CARDIOVASCULAR: S1, S2 normal. No murmurs, rubs, or gallops.  ABDOMEN: Soft, nontender, nondistended. Bowel sounds present. No organomegaly or mass.  EXTREMITIES: No pedal edema, cyanosis, or clubbing.  NEUROLOGIC: Cranial nerves II through XII are intact. Muscle strength 5/5 in all extremities. Sensation intact. Gait not checked.  PSYCHIATRIC: The patient is alert and oriented x 3.  SKIN: No obvious rash, lesion, or ulcer.   Data Review     CBC w Diff:  Lab Results  Component Value Date   WBC 8.3 11/13/2016   HGB 10.1 (  L) 11/13/2016   HGB 15.7 01/31/2012   HCT 32.0 (L) 11/13/2016   HCT 45.0 01/31/2012   PLT 112 (L) 11/13/2016   PLT 132 (L) 01/31/2012   LYMPHOPCT 23 11/11/2015   MONOPCT 7 11/11/2015   EOSPCT 3 11/11/2015   BASOPCT 1 11/11/2015   CMP:  Lab Results  Component Value Date   NA 136 11/13/2016   NA 140 01/31/2012   K 4.1 11/13/2016   K 3.8 01/31/2012   CL 105 11/13/2016   CL 106 01/31/2012   CO2 24 11/13/2016   CO2 24 01/31/2012   BUN 14 11/13/2016   BUN 7 01/31/2012   CREATININE 0.87 11/13/2016   CREATININE 0.84 01/31/2012   PROT 8.5 (H) 08/24/2016   PROT 8.0  01/31/2012   ALBUMIN 3.5 08/24/2016   ALBUMIN 3.5 01/31/2012   BILITOT 0.9 08/24/2016   BILITOT 0.4 01/31/2012   ALKPHOS 145 (H) 08/24/2016   ALKPHOS 204 (H) 01/31/2012   AST 60 (H) 08/24/2016   AST 106 (H) 01/31/2012   ALT 44 08/24/2016   ALT 137 (H) 01/31/2012  .  Micro Results No results found for this or any previous visit (from the past 240 hour(s)).      Code Status Orders        Start     Ordered   11/12/16 1047  Full code  Continuous     11/12/16 1046    Code Status History    Date Active Date Inactive Code Status Order ID Comments User Context   12/10/2015  2:55 PM 12/14/2015  3:40 PM Full Code [id number]  Erin Fulling, MD ED   11/11/2015  6:38 PM 11/12/2015  9:01 PM Full Code [id number]  Alford Highland, MD ED            Discharge Medications   Allergies as of 11/13/2016      Reactions   Aspirin Anaphylaxis, Palpitations   Patient takes ibuprofen as home med, so no problem with NSAIDs   Tramadol Anaphylaxis   Latex    Vicodin [hydrocodone-acetaminophen] Palpitations      Medication List    STOP taking these medications   clopidogrel 75 MG tablet Commonly known as:  PLAVIX     TAKE these medications   diphenhydrAMINE 50 MG capsule Commonly known as:  BENADRYL Take 50 mg by mouth every 6 (six) hours as needed.   gabapentin 300 MG capsule Commonly known as:  NEURONTIN Take 1 capsule (300 mg total) by mouth 3 (three) times daily.   hydrOXYzine 10 MG tablet Commonly known as:  ATARAX/VISTARIL Take 1 tablet (10 mg total) by mouth 3 (three) times daily as needed.   pantoprazole 40 MG tablet Commonly known as:  PROTONIX Take 1 tablet (40 mg total) by mouth 2 (two) times daily.   traZODone 100 MG tablet Commonly known as:  DESYREL Take 1 tablet (100 mg total) by mouth at bedtime.   venlafaxine XR 150 MG 24 hr capsule Commonly known as:  EFFEXOR-XR Take 1 capsule (150 mg total) by mouth daily with breakfast.          Total Time in  preparing paper work, data evaluation and todays exam - 35 minutes  Auburn Bilberry M.D on 11/13/2016 at 2:50 PM  [facility]   Office  [phone]

## 2016-11-13 NOTE — OR Nursing (Signed)
Report called to Darl PikesSusan, RN. Questions answered, pts vital signs within normal limits, no bleeding or hematoma at the right groin site, pt needed to be roused each time to rate her pain 9-10/10. Nurse notified that patient needed to remain NPO until the MD decides is she will come back for an additional procedure.

## 2016-11-13 NOTE — Progress Notes (Signed)
Patient complaining of continuous headache. Md notified. No new orders. Morphine given. Will monitor. Patient asked me to notify Trula OreChristina, daughter to update on plan of care. Discussed care with daughter. Acknowledged. Patient refused bed alarm. Educated on importance of not getting up alone and educated on how to call for assistance. Verbalized understanding.

## 2016-11-13 NOTE — Progress Notes (Signed)
Pt returned to room 257. slert oriented. Up to bsc to void. Groin dressing clean and dry. stron pedal pulses.

## 2016-11-14 DIAGNOSIS — E785 Hyperlipidemia, unspecified: Secondary | ICD-10-CM | POA: Insufficient documentation

## 2016-11-14 DIAGNOSIS — K7460 Unspecified cirrhosis of liver: Secondary | ICD-10-CM | POA: Diagnosis present

## 2016-11-14 LAB — HEMOGLOBIN A1C
Hgb A1c MFr Bld: 6.1 % — ABNORMAL HIGH (ref 4.8–5.6)
MEAN PLASMA GLUCOSE: 128 mg/dL

## 2016-11-14 LAB — HIV ANTIBODY (ROUTINE TESTING W REFLEX): HIV Screen 4th Generation wRfx: NONREACTIVE

## 2016-11-17 DIAGNOSIS — Z951 Presence of aortocoronary bypass graft: Secondary | ICD-10-CM | POA: Insufficient documentation

## 2016-12-03 ENCOUNTER — Emergency Department: Payer: Medicare Other

## 2016-12-03 ENCOUNTER — Emergency Department
Admission: EM | Admit: 2016-12-03 | Discharge: 2016-12-04 | Disposition: A | Discharge status: Other Acute Inpatient Hospital | Payer: Medicare Other | Attending: Emergency Medicine | Admitting: Emergency Medicine

## 2016-12-03 ENCOUNTER — Encounter: Payer: Self-pay | Admitting: Emergency Medicine

## 2016-12-03 DIAGNOSIS — I2510 Atherosclerotic heart disease of native coronary artery without angina pectoris: Secondary | ICD-10-CM | POA: Diagnosis not present

## 2016-12-03 DIAGNOSIS — R079 Chest pain, unspecified: Secondary | ICD-10-CM

## 2016-12-03 DIAGNOSIS — I509 Heart failure, unspecified: Secondary | ICD-10-CM | POA: Insufficient documentation

## 2016-12-03 DIAGNOSIS — J90 Pleural effusion, not elsewhere classified: Secondary | ICD-10-CM | POA: Insufficient documentation

## 2016-12-03 DIAGNOSIS — E876 Hypokalemia: Secondary | ICD-10-CM | POA: Insufficient documentation

## 2016-12-03 DIAGNOSIS — Z79899 Other long term (current) drug therapy: Secondary | ICD-10-CM | POA: Diagnosis not present

## 2016-12-03 DIAGNOSIS — E119 Type 2 diabetes mellitus without complications: Secondary | ICD-10-CM | POA: Insufficient documentation

## 2016-12-03 DIAGNOSIS — I110 Hypertensive heart disease with heart failure: Secondary | ICD-10-CM | POA: Diagnosis not present

## 2016-12-03 DIAGNOSIS — E871 Hypo-osmolality and hyponatremia: Secondary | ICD-10-CM | POA: Diagnosis not present

## 2016-12-03 DIAGNOSIS — F17210 Nicotine dependence, cigarettes, uncomplicated: Secondary | ICD-10-CM | POA: Insufficient documentation

## 2016-12-03 DIAGNOSIS — Z951 Presence of aortocoronary bypass graft: Secondary | ICD-10-CM | POA: Insufficient documentation

## 2016-12-03 DIAGNOSIS — I252 Old myocardial infarction: Secondary | ICD-10-CM | POA: Diagnosis not present

## 2016-12-03 DIAGNOSIS — Z91040 Latex allergy status: Secondary | ICD-10-CM | POA: Insufficient documentation

## 2016-12-03 DIAGNOSIS — D649 Anemia, unspecified: Secondary | ICD-10-CM

## 2016-12-03 DIAGNOSIS — J449 Chronic obstructive pulmonary disease, unspecified: Secondary | ICD-10-CM | POA: Diagnosis not present

## 2016-12-03 LAB — TROPONIN I: Troponin I: <0.03 ng/mL (ref <0.03)

## 2016-12-03 LAB — CBC
HCT: 26.4 % — ABNORMAL LOW (ref 35.0–47.0)
Hemoglobin: 8.5 g/dL — ABNORMAL LOW (ref 12.0–16.0)
MCH: 22 pg — ABNORMAL LOW (ref 26.0–34.0)
MCHC: 32.3 g/dL (ref 32.0–36.0)
MCV: 68.2 fL — AB (ref 80.0–100.0)
Platelets: 116 10*3/uL — ABNORMAL LOW (ref 150–440)
RBC: 3.87 MIL/uL (ref 3.80–5.20)
RDW: 23.8 % — AB (ref 11.5–14.5)
WBC: 15.4 10*3/uL — AB (ref 3.6–11.0)

## 2016-12-03 LAB — BASIC METABOLIC PANEL
Anion gap: 11 (ref 5–15)
BUN: 16 mg/dL (ref 6–20)
CHLORIDE: 94 mmol/L — AB (ref 101–111)
CO2: 23 mmol/L (ref 22–32)
CREATININE: 0.98 mg/dL (ref 0.44–1.00)
Calcium: 7.6 mg/dL — ABNORMAL LOW (ref 8.9–10.3)
GFR calc Af Amer: >60 mL/min (ref >60)
GFR calc non Af Amer: >60 mL/min (ref >60)
GLUCOSE: 113 mg/dL — AB (ref 65–99)
Potassium: 2.5 mmol/L — CL (ref 3.5–5.1)
SODIUM: 128 mmol/L — AB (ref 135–145)

## 2016-12-03 MED ORDER — POTASSIUM CHLORIDE 10 MEQ/100ML IV SOLN
10.0000 meq | INTRAVENOUS | Status: AC
  Administered 2016-12-04 (×2): 10 meq via INTRAVENOUS
  Filled 2016-12-03 (×2): qty 100

## 2016-12-03 MED ORDER — SODIUM CHLORIDE 0.9 % IV BOLUS (SEPSIS)
250.0000 mL | Freq: Once | INTRAVENOUS | Status: AC
  Administered 2016-12-04: 250 mL via INTRAVENOUS

## 2016-12-03 NOTE — ED Notes (Signed)
Pt arrived via EMS from home with c/o chest pain[facility]; discharged 5 days ago; generalized chest pain since; today pain to left chest radiating up to neck; nothing makes it better or worse; sharp pain rating 7/10; 324mg  ASA, 1 nitro tablet SL with no relief; 12Lead normal sinus rhythm; no IV established; pt denies headache at this time

## 2016-12-03 NOTE — ED Triage Notes (Signed)
Pt presents to ED via EMS with c/o persistent chest pain since [facility] Pt states pain has progressively got worse since yesterday.

## 2016-12-03 NOTE — ED Notes (Signed)
Potassium 2.5 called to Dr Zenda AlpersWebster; value acknowledged;

## 2016-12-04 ENCOUNTER — Ambulatory Visit (HOSPITAL_COMMUNITY)
Admission: AD | Admit: 2016-12-04 | Discharge: 2016-12-04 | Disposition: A | Discharge status: Home / Self Care | Payer: Medicare Other | Source: Other Acute Inpatient Hospital | Attending: Emergency Medicine | Admitting: Emergency Medicine

## 2016-12-04 DIAGNOSIS — I25709 Atherosclerosis of coronary artery bypass graft(s), unspecified, with unspecified angina pectoris: Secondary | ICD-10-CM | POA: Insufficient documentation

## 2016-12-04 LAB — BLOOD GAS, VENOUS
ACID-BASE EXCESS: 4.8 mmol/L — AB (ref 0.0–2.0)
Bicarbonate: 27.6 mmol/L (ref 20.0–28.0)
Delivery systems: POSITIVE
FIO2: 0.35
O2 SAT: 89.4 %
PCO2 VEN: 33 mmHg — AB (ref 44.0–60.0)
Patient temperature: 37
pH, Ven: 7.53 — ABNORMAL HIGH (ref 7.250–7.430)
pO2, Ven: 50 mmHg — ABNORMAL HIGH (ref 32.0–45.0)

## 2016-12-04 LAB — BRAIN NATRIURETIC PEPTIDE: B Natriuretic Peptide: 345 pg/mL — ABNORMAL HIGH (ref 0.0–100.0)

## 2016-12-04 LAB — TYPE AND SCREEN
ABO/RH(D): O POS
ANTIBODY SCREEN: NEGATIVE

## 2016-12-04 MED ORDER — SODIUM CHLORIDE 0.9 % IV BOLUS (SEPSIS)
250.0000 mL | Freq: Once | INTRAVENOUS | Status: AC
  Administered 2016-12-04: 250 mL via INTRAVENOUS

## 2016-12-04 NOTE — ED Notes (Signed)
Pt's daughter signed EMTALA consent for transfer. Pt awoken by this rn after daughter left and asked pt if it is ok her daughter signed consent [facility]. Pt shakes head "yes" when asked if daughter may sign consent for [facility].

## 2016-12-04 NOTE — ED Provider Notes (Addendum)
[facility] Emergency Department Provider Note   ____________________________________________   First MD Initiated Contact with Patient 12/03/16 2340     (approximate)  I have reviewed the triage vital signs and the nursing notes.   HISTORY  Chief Complaint Chest Pain    HPI Meghan Jimenez is a 56 y.o. female who comes into the hospital today with some chest pain. The patient had a massive heart attack and had a CABG at [facility] approximately 3 weeks ago. The patient states that she has some left-sided pain that started around noon today. She reports it is not bad and she's had some right-sided pain. The patient states her children made her come in. She denies any shortness of breath but has been feeling sleepy. The patient rates her pain a 7 out of 10 in intensity currently. She is was a follow-up at [facility] tomorrow. She's had some nausea with no vomiting. The patient cannot describe her pain. The patient's family states that she's been laying around for the last few days and not very active. They're concerned about her and states she is still been smoking. They want her to come in and get checked out.She states that she has run out of pain medication and had been previously on oxycontin. The patient was discharged from [facility] on July 30.   Past Medical History:  Diagnosis Date  . CAD (coronary artery disease)   . CHF (congestive heart failure) (HCC)   . Cirrhosis (HCC)   . COPD (chronic obstructive pulmonary disease) (HCC)   . Diabetes mellitus without complication (HCC)   . Hepatitis C   . Hypertension     Patient Active Problem List   Diagnosis Date Noted  . Chest pain 11/12/2016  . PTSD (post-traumatic stress disorder) 12/13/2015  . Dysthymia 12/13/2015  . GIB (gastrointestinal bleeding) 12/10/2015  . Unstable angina (HCC) 11/11/2015    Past Surgical History:  Procedure Laterality Date  . BACK SURGERY    . CARDIAC SURGERY    .  ESOPHAGOGASTRODUODENOSCOPY (EGD) WITH PROPOFOL N/A 12/11/2015   Procedure: ESOPHAGOGASTRODUODENOSCOPY (EGD) WITH PROPOFOL;  Surgeon: [name], MD;  Location: [facility] ENDOSCOPY;  Service: Gastroenterology;  Laterality: N/A;  . LEFT HEART CATH AND CORONARY ANGIOGRAPHY N/A 11/13/2016   Procedure: Left Heart Cath and Coronary Angiography;  Surgeon: [name], MD;  Location: [facility] INVASIVE CV LAB;  Service: Cardiovascular;  Laterality: N/A;    Prior to Admission medications   Medication Sig Start Date End Date Taking? Authorizing Provider  atorvastatin (LIPITOR) 40 MG tablet Take 40 mg by mouth daily. 11/27/16  Yes [provider]  diphenhydrAMINE (BENADRYL) 50 MG capsule Take 50 mg by mouth every 6 (six) hours as needed.   Yes [provider]  furosemide (LASIX) 40 MG tablet Take 1 tablet by mouth 2 (two) times daily. 11/27/16  Yes [provider]  gabapentin (NEURONTIN) 300 MG capsule Take 1 capsule (300 mg total) by mouth 3 (three) times daily. 12/14/15  Yes Auburn Bilberry, MD  hydrOXYzine (ATARAX/VISTARIL) 10 MG tablet Take 1 tablet (10 mg total) by mouth 3 (three) times daily as needed. 12/14/15  Yes Auburn Bilberry, MD  metoprolol tartrate (LOPRESSOR) 25 MG tablet Take 25 mg by mouth 2 (two) times daily. 11/27/16  Yes [provider]  oxyCODONE (OXY IR/ROXICODONE) 5 MG immediate release tablet Take 1 tablet by mouth 4 (four) times daily as needed. 11/27/16  Yes [provider]  pantoprazole (PROTONIX) 40 MG tablet Take  1 tablet (40 mg total) by mouth 2 (two) times daily. 12/14/15  Yes Auburn Bilberry, MD  potassium chloride SA (K-DUR,KLOR-CON) 20 MEQ tablet Take 1 tablet by mouth 2 (two) times daily. 11/27/16  Yes [provider]  spironolactone (ALDACTONE) 25 MG tablet Take 1 tablet by mouth daily. 11/27/16  Yes [provider]  traZODone (DESYREL) 100 MG tablet Take 1 tablet (100 mg total) by mouth at bedtime. 12/14/15  Yes Auburn Bilberry, MD  venlafaxine XR (EFFEXOR-XR) 150 MG 24 hr capsule Take 1 capsule (150 mg total) by mouth daily with breakfast. 12/14/15  Yes Auburn Bilberry, MD    Allergies Aspirin; Tramadol; Acetaminophen; Latex; Codeine; and Vicodin [hydrocodone-acetaminophen]  Family History  Problem Relation Age of Onset  . CAD Mother   . Diabetes Mother   . Lung cancer Father     Social History Social History  Substance Use Topics  . Smoking status: Current Every Day Smoker    Packs/day: 1.00    Types: Cigarettes  . Smokeless tobacco: Never Used     Comment: SMOKES 3-4 PKS/DAY  . Alcohol use Yes     Comment: Drinks occasionally every week    Review of Systems  Constitutional:Fatigue Eyes: No visual changes. ENT: No sore throat. Cardiovascular:  chest pain. Respiratory: Denies shortness of breath. Gastrointestinal: Nausea with No abdominal pain, no vomiting.  No diarrhea.  No constipation. Genitourinary: Negative for dysuria. Musculoskeletal: Negative for back pain. Skin: Negative for rash. Neurological: Negative for headaches, focal weakness or numbness.   ____________________________________________   PHYSICAL EXAM:  VITAL SIGNS: ED Triage Vitals  Enc Vitals Group     BP 12/04/16 0000 (!) 91/54     Pulse Rate 12/03/16 2302 97     Resp 12/03/16 2302 (!) 25     Temp 12/03/16 2302 99 F (37.2 C)     Temp Source 12/03/16 2302 Oral     SpO2 12/03/16 2302 94 %     Weight 12/03/16 2304 194 lb (88 kg)     Height 12/03/16 2304 5\' 4"  (1.626 m)     Head Circumference --      Peak Flow --      Pain Score 12/03/16 2301 10     Pain Loc --      Pain Edu? --      Excl. in GC? --     Constitutional: Alert and oriented. somnolent appearing and in moderate distress. Eyes: Conjunctivae are normal. PERRL. EOMI. Head: Atraumatic. Nose: No congestion/rhinnorhea. Mouth/Throat: Mucous membranes are moist.  Oropharynx non-erythematous. Cardiovascular: Normal rate, regular rhythm.  Grossly normal heart sounds.  Good peripheral circulation. Respiratory: increased respiratory effort with some prolonged expiratory phase,  No retractions. Lungs mildly diminished. Gastrointestinal: Soft and nontender. No distention. Positive bowel sounds Musculoskeletal: No lower extremity tenderness nor edema.   Neurologic:  Normal speech and language.  Skin:  Skin is warm, dry and intact. Surgical scar, mid chest, with some mild surrounding erythema, no drainage and no swelling Psychiatric: Mood and affect are normal.   ____________________________________________   LABS (all labs ordered are listed, but only abnormal results are displayed)  Labs Reviewed  BASIC METABOLIC PANEL - Abnormal; Notable for the following:       Result Value   Sodium 128 (*)    Potassium 2.5 (*)    Chloride 94 (*)    Glucose, Bld 113 (*)    Calcium 7.6 (*)    All other components within normal limits  CBC -  Abnormal; Notable for the following:    WBC 15.4 (*)    Hemoglobin 8.5 (*)    HCT 26.4 (*)    MCV 68.2 (*)    MCH 22.0 (*)    RDW 23.8 (*)    Platelets 116 (*)    All other components within normal limits  BRAIN NATRIURETIC PEPTIDE - Abnormal; Notable for the following:    B Natriuretic Peptide 345.0 (*)    All other components within normal limits  BLOOD GAS, VENOUS - Abnormal; Notable for the following:    pH, Ven 7.53 (*)    pCO2, Ven 33 (*)    pO2, Ven 50.0 (*)    Acid-Base Excess 4.8 (*)    All other components within normal limits  TROPONIN I  TYPE AND SCREEN   ____________________________________________  EKG  ED ECG REPORT I, [name], the attending physician, personally viewed and interpreted this ECG.   Date: 12/03/2016  EKG Time: 2305  Rate: 96  Rhythm: normal sinus rhythm  Axis: normal  Intervals:right bundle branch block  ST&T Change: none  ____________________________________________  RADIOLOGY  Dg Chest 2 View  Result Date: 12/03/2016 CLINICAL  DATA:  Initial evaluation for acute chest pain. EXAM: CHEST  2 VIEW COMPARISON:  Prior radiograph from 11/12/2016. FINDINGS: Median sternotomy wires with underlying surgical clips, new from prior. Mild cardiomegaly, also increased from previous. Mediastinal silhouette normal. Moderate left pleural effusion, extending over the left lung apex. Associated left basilar opacity likely atelectasis. Mild diffuse pulmonary vascular congestion without frank pulmonary edema. No other focal infiltrates. No pneumothorax. No acute osseous abnormality.  Cervical ACDF noted. IMPRESSION: 1. Sequelae of interval CABG with diffuse pulmonary vascular congestion without frank pulmonary edema. 2. Moderate left pleural effusion with associated left basilar opacity, likely atelectasis. Electronically Signed   By: Rise Mu M.D.   On: 12/03/2016 23:47    ____________________________________________   PROCEDURES  Procedure(s) performed: None  Procedures  Critical Care performed: Yes, see critical care note(s)   CRITICAL CARE Performed by: Lucrezia Europe P   Total critical care time: 35 minutes  Critical care time was exclusive of separately billable procedures and treating other patients.  Critical care was necessary to treat or prevent imminent or life-threatening deterioration.  Critical care was time spent personally by me on the following activities: development of treatment plan with patient and/or surrogate as well as nursing, discussions with consultants, evaluation of patient's response to treatment, examination of patient, obtaining history from patient or surrogate, ordering and performing treatments and interventions, ordering and review of laboratory studies, ordering and review of radiographic studies, pulse oximetry and re-evaluation of patient's condition.   ____________________________________________   INITIAL IMPRESSION / ASSESSMENT AND PLAN / ED COURSE  Pertinent labs &  imaging results that were available during my care of the patient were reviewed by me and considered in my medical decision making (see chart for details).  This is a 56 year old female who comes into the hospital today some chest pain. The patient had a CABG approximately 3 weeks ago. The patient's EKG shows a right bundle branch block but she does not have any ST segment elevation or flipped T waves. The patient to has a hemoglobin of 8.5 when 6 days ago it was 10.0. The patient is also hypokalemic and hyponatremic. The patient had a chest x-ray which showed a left-sided effusion and some pulmonary vascular congestion. Given that the patient recently had surgery at [facility] and felt it better for  the patient to return to [facility]. I contacted the CCU fellow and spoke to Gwynne EdingerKevin Friede. I had initially placed the patient on BiPAP given her work of breathing. He stated that the patient did have an effusion when she was at [facility] previously and that her blood pressures are typically in the high 80s to low 90s. He felt though that given her hemoglobin as well as her electrolyte abnormalities it would be appropriate for her to return [facility]. He did ask the patient needed to stay on BiPAP and I informed him that she was not hypoxic nor complaining of shortness of breath and that we could attempt to place her on CPAP. We converted the patient to CPAP and she did not have any hypoxia and her work of breathing remained in the low 20s. I spoke to [facility] after approximately one hour on those settings and he agreed to accept the patient to the stepdown unit. At this time we are awaiting transfer to [facility].     The patient continued doing well in the emergency department. When transport arrived they were able to take her off of the CPAP to place her in the truck for transport. The patient reports that her chest pain is gone and her breathing is improved. She did receive some potassium and 500 ML's of normal saline. Her blood pressure  stayed in the mid to high 80s and low 90s. Again I reiterated this to the fellow at [facility] and he reports that that is not very far from what her baseline had been while she was [facility]. [facility].  The nurse states that she spoke to the patient's daughter who was concerned that the patient had been recently smoking marijuana and doing cocaine which may have contributed to her chest pain. ____________________________________________   FINAL CLINICAL IMPRESSION(S) / ED DIAGNOSES  Final diagnoses:  Hyponatremia  Hypokalemia  Pleural effusion  Anemia, unspecified type  Chest pain, unspecified type      NEW MEDICATIONS STARTED DURING THIS VISIT:  Discharge Medication List as of 12/04/2016  5:46 AM       Note:  This document was prepared using Dragon voice recognition software and may include unintentional dictation errors.    Rebecka ApleyWebster, Allison P, MD 12/04/16 [id number]    Rebecka ApleyWebster, Allison P, MD 12/04/16 [phone]

## 2016-12-04 NOTE — ED Notes (Signed)
Respiratory therapy, Candace, notified that Dr. Zenda AlpersWebster would BIPAP set to CPAP.

## 2016-12-04 NOTE — ED Notes (Signed)
[place]

## 2016-12-04 NOTE — ED Notes (Signed)
[facility] here for transport.  

## 2016-12-04 NOTE — ED Notes (Signed)
EMTALA form reviewed. 

## 2016-12-07 DIAGNOSIS — Z481 Encounter for planned postprocedural wound closure: Secondary | ICD-10-CM | POA: Insufficient documentation

## 2016-12-07 DIAGNOSIS — J9851 Mediastinitis: Secondary | ICD-10-CM | POA: Insufficient documentation

## 2017-01-03 ENCOUNTER — Other Ambulatory Visit
Admission: RE | Admit: 2017-01-03 | Discharge: 2017-01-03 | Disposition: A | Discharge status: Home / Self Care | Payer: Medicare Other | Source: Ambulatory Visit | Attending: *Deleted | Admitting: *Deleted

## 2017-01-03 ENCOUNTER — Emergency Department: Payer: Medicare Other

## 2017-01-03 ENCOUNTER — Emergency Department
Admission: EM | Admit: 2017-01-03 | Discharge: 2017-01-04 | Disposition: A | Discharge status: Home / Self Care | Payer: Medicare Other | Attending: Emergency Medicine | Admitting: Emergency Medicine

## 2017-01-03 ENCOUNTER — Other Ambulatory Visit: Payer: Self-pay

## 2017-01-03 ENCOUNTER — Encounter: Payer: Self-pay | Admitting: *Deleted

## 2017-01-03 DIAGNOSIS — F17210 Nicotine dependence, cigarettes, uncomplicated: Secondary | ICD-10-CM | POA: Diagnosis not present

## 2017-01-03 DIAGNOSIS — E876 Hypokalemia: Secondary | ICD-10-CM | POA: Diagnosis not present

## 2017-01-03 DIAGNOSIS — E872 Acidosis, unspecified: Secondary | ICD-10-CM

## 2017-01-03 DIAGNOSIS — Z91040 Latex allergy status: Secondary | ICD-10-CM | POA: Diagnosis not present

## 2017-01-03 DIAGNOSIS — G8918 Other acute postprocedural pain: Secondary | ICD-10-CM

## 2017-01-03 DIAGNOSIS — I2510 Atherosclerotic heart disease of native coronary artery without angina pectoris: Secondary | ICD-10-CM | POA: Insufficient documentation

## 2017-01-03 DIAGNOSIS — J449 Chronic obstructive pulmonary disease, unspecified: Secondary | ICD-10-CM | POA: Diagnosis not present

## 2017-01-03 DIAGNOSIS — R0789 Other chest pain: Secondary | ICD-10-CM

## 2017-01-03 DIAGNOSIS — R079 Chest pain, unspecified: Secondary | ICD-10-CM | POA: Diagnosis present

## 2017-01-03 DIAGNOSIS — E119 Type 2 diabetes mellitus without complications: Secondary | ICD-10-CM | POA: Insufficient documentation

## 2017-01-03 DIAGNOSIS — Z79899 Other long term (current) drug therapy: Secondary | ICD-10-CM | POA: Diagnosis not present

## 2017-01-03 DIAGNOSIS — I110 Hypertensive heart disease with heart failure: Secondary | ICD-10-CM | POA: Insufficient documentation

## 2017-01-03 DIAGNOSIS — I509 Heart failure, unspecified: Secondary | ICD-10-CM | POA: Insufficient documentation

## 2017-01-03 LAB — CBC WITH DIFFERENTIAL/PLATELET
Basophils Absolute: 0 10*3/uL (ref 0–0.1)
Basophils Relative: 1 %
Eosinophils Absolute: 0.1 10*3/uL (ref 0–0.7)
Eosinophils Relative: 3 %
HEMATOCRIT: 21.8 % — AB (ref 35.0–47.0)
HEMOGLOBIN: 6.8 g/dL — AB (ref 12.0–16.0)
LYMPHS ABS: 1.1 10*3/uL (ref 1.0–3.6)
LYMPHS PCT: 22 %
MCH: 21.3 pg — AB (ref 26.0–34.0)
MCHC: 31.3 g/dL — AB (ref 32.0–36.0)
MCV: 67.9 fL — AB (ref 80.0–100.0)
MONOS PCT: 13 %
Monocytes Absolute: 0.7 10*3/uL (ref 0.2–0.9)
NEUTROS PCT: 61 %
Neutro Abs: 3.2 10*3/uL (ref 1.4–6.5)
Platelets: 153 10*3/uL (ref 150–440)
RBC: 3.22 MIL/uL — AB (ref 3.80–5.20)
RDW: 21.3 % — ABNORMAL HIGH (ref 11.5–14.5)
WBC: 5.1 10*3/uL (ref 3.6–11.0)

## 2017-01-03 LAB — COMPREHENSIVE METABOLIC PANEL
ALT: 6 U/L — ABNORMAL LOW (ref 14–54)
AST: 45 U/L — AB (ref 15–41)
Albumin: 1.8 g/dL — ABNORMAL LOW (ref 3.5–5.0)
Alkaline Phosphatase: 128 U/L — ABNORMAL HIGH (ref 38–126)
Anion gap: 11 (ref 5–15)
BUN: 7 mg/dL (ref 6–20)
CHLORIDE: 93 mmol/L — AB (ref 101–111)
CO2: 30 mmol/L (ref 22–32)
Calcium: 7.3 mg/dL — ABNORMAL LOW (ref 8.9–10.3)
Creatinine, Ser: 0.65 mg/dL (ref 0.44–1.00)
Glucose, Bld: 139 mg/dL — ABNORMAL HIGH (ref 65–99)
Potassium: 2 mmol/L — CL (ref 3.5–5.1)
SODIUM: 134 mmol/L — AB (ref 135–145)
Total Bilirubin: 1.2 mg/dL (ref 0.3–1.2)
Total Protein: 5.5 g/dL — ABNORMAL LOW (ref 6.5–8.1)

## 2017-01-03 LAB — PREPARE RBC (CROSSMATCH)

## 2017-01-03 LAB — LACTIC ACID, PLASMA
Lactic Acid, Venous: 2.1 mmol/L (ref 0.5–1.9)
Lactic Acid, Venous: 1.7 mmol/L (ref 0.5–1.9)

## 2017-01-03 LAB — MAGNESIUM: Magnesium: 1.4 mg/dL — ABNORMAL LOW (ref 1.7–2.4)

## 2017-01-03 MED ORDER — SODIUM CHLORIDE 0.9 % IV BOLUS (SEPSIS)
1000.0000 mL | Freq: Once | INTRAVENOUS | Status: AC
  Administered 2017-01-03: 1000 mL via INTRAVENOUS

## 2017-01-03 MED ORDER — VANCOMYCIN HCL 10 G IV SOLR: 1.0000 g | Freq: Once | INTRAVENOUS | Status: DC

## 2017-01-03 MED ORDER — OXYCODONE HCL 5 MG PO TABS
5.0000 mg | ORAL_TABLET | Freq: Once | ORAL | Status: AC
  Administered 2017-01-03: 5 mg via ORAL
  Filled 2017-01-03: qty 1

## 2017-01-03 MED ORDER — ALBUTEROL SULFATE (2.5 MG/3ML) 0.083% IN NEBU
2.5000 mg | INHALATION_SOLUTION | Freq: Once | RESPIRATORY_TRACT | Status: AC
  Administered 2017-01-03: 2.5 mg via RESPIRATORY_TRACT

## 2017-01-03 MED ORDER — VANCOMYCIN HCL IN DEXTROSE 1-5 GM/200ML-% IV SOLN
1000.0000 mg | Freq: Once | INTRAVENOUS | Status: AC
  Administered 2017-01-03: 1000 mg via INTRAVENOUS
  Filled 2017-01-03: qty 200

## 2017-01-03 MED ORDER — ONDANSETRON HCL 4 MG/2ML IJ SOLN
INTRAMUSCULAR | Status: AC
  Administered 2017-01-03: 4 mg via INTRAVENOUS
  Filled 2017-01-03: qty 2

## 2017-01-03 MED ORDER — IOPAMIDOL (ISOVUE-300) INJECTION 61%
75.0000 mL | Freq: Once | INTRAVENOUS | Status: AC | PRN
  Administered 2017-01-03: 75 mL via INTRAVENOUS

## 2017-01-03 MED ORDER — VENLAFAXINE HCL ER 150 MG PO CP24
150.0000 mg | ORAL_CAPSULE | Freq: Once | ORAL | Status: AC
  Administered 2017-01-03: 150 mg via ORAL
  Filled 2017-01-03: qty 1

## 2017-01-03 MED ORDER — MAGNESIUM SULFATE 2 GM/50ML IV SOLN
2.0000 g | Freq: Once | INTRAVENOUS | Status: AC
  Administered 2017-01-04: 2 g via INTRAVENOUS
  Filled 2017-01-03: qty 50

## 2017-01-03 MED ORDER — POTASSIUM CHLORIDE 20 MEQ PO PACK
PACK | ORAL | Status: AC
  Administered 2017-01-03: 40 meq
  Filled 2017-01-03: qty 2

## 2017-01-03 MED ORDER — SODIUM CHLORIDE 0.9 % IV SOLN
10.0000 mL/h | Freq: Once | INTRAVENOUS | Status: AC
  Administered 2017-01-03: 10 mL/h via INTRAVENOUS

## 2017-01-03 MED ORDER — ALBUTEROL SULFATE (2.5 MG/3ML) 0.083% IN NEBU
INHALATION_SOLUTION | RESPIRATORY_TRACT | Status: AC
  Administered 2017-01-03: 2.5 mg via RESPIRATORY_TRACT
  Filled 2017-01-03: qty 3

## 2017-01-03 MED ORDER — ONDANSETRON HCL 4 MG/2ML IJ SOLN
4.0000 mg | Freq: Once | INTRAMUSCULAR | Status: AC
  Administered 2017-01-03: 4 mg via INTRAVENOUS

## 2017-01-03 NOTE — ED Notes (Signed)
Lab called and made aware that blood was collected and sent.

## 2017-01-03 NOTE — ED Notes (Signed)
Patient refusing to let this RN stick for second set of blood cultures. MD made aware. Will obtain second set of cultures in 30 minutes.

## 2017-01-03 NOTE — ED Notes (Signed)
Pt verbalized concern over missing her Effexor. MD made aware and gave verbal order to order pt's routine dosage.

## 2017-01-03 NOTE — ED Notes (Signed)
Pt request albuterol. Wheezing auscultated upon assessment. MD made aware. See MAR for administered medication.

## 2017-01-03 NOTE — ED Notes (Signed)
Blood consent in chart.

## 2017-01-03 NOTE — ED Provider Notes (Signed)
[facility] Emergency Department Provider Note  ____________________________________________   I have reviewed the triage vital signs and the nursing notes.   HISTORY  Chief Complaint Chest Pain   History limited by: Not Limited   HPI Meghan Jimenez is a 56 y.o. female who presents to the emergency department today because of concerns for chest pain. The patient underwent CABG at [facility] 2 months ago. She has had to have a follow-up surgery since that time and has been diagnosed with MSSA bacteremia. Today the patient has had some increased drainage from the lower part of the incision. The son also states that the patient had a little bit of a wall just at the upper part of the incision which has resolved. Home health nurse stated she had a fever. Son thinks that it was about 100. The patient has an appointment tomorrow at [facility] and presents to our emergency department today because the son "drives a big truck and cannot park at [place]".    Past Medical History:  Diagnosis Date  . CAD (coronary artery disease)   . CHF (congestive heart failure) (HCC)   . Cirrhosis (HCC)   . COPD (chronic obstructive pulmonary disease) (HCC)   . Diabetes mellitus without complication (HCC)   . Hepatitis C   . Hypertension     Patient Active Problem List   Diagnosis Date Noted  . Chest pain 11/12/2016  . PTSD (post-traumatic stress disorder) 12/13/2015  . Dysthymia 12/13/2015  . GIB (gastrointestinal bleeding) 12/10/2015  . Unstable angina (HCC) 11/11/2015    Past Surgical History:  Procedure Laterality Date  . BACK SURGERY    . CARDIAC SURGERY    . ESOPHAGOGASTRODUODENOSCOPY (EGD) WITH PROPOFOL N/A 12/11/2015   Procedure: ESOPHAGOGASTRODUODENOSCOPY (EGD) WITH PROPOFOL;  Surgeon: [name], MD;  Location: [facility] ENDOSCOPY;  Service: Gastroenterology;  Laterality: N/A;  . LEFT HEART CATH AND CORONARY ANGIOGRAPHY N/A 11/13/2016   Procedure: Left Heart  Cath and Coronary Angiography;  Surgeon: [name], MD;  Location: [facility] INVASIVE CV LAB;  Service: Cardiovascular;  Laterality: N/A;    Prior to Admission medications   Medication Sig Start Date End Date Taking? Authorizing Provider  atorvastatin (LIPITOR) 40 MG tablet Take 40 mg by mouth daily. 11/27/16   [provider]  diphenhydrAMINE (BENADRYL) 50 MG capsule Take 50 mg by mouth every 6 (six) hours as needed.    [provider]  furosemide (LASIX) 40 MG tablet Take 1 tablet by mouth 2 (two) times daily. 11/27/16   [provider]  gabapentin (NEURONTIN) 300 MG capsule Take 1 capsule (300 mg total) by mouth 3 (three) times daily. 12/14/15   Auburn Bilberry, MD  hydrOXYzine (ATARAX/VISTARIL) 10 MG tablet Take 1 tablet (10 mg total) by mouth 3 (three) times daily as needed. 12/14/15   Auburn Bilberry, MD  metoprolol tartrate (LOPRESSOR) 25 MG tablet Take 25 mg by mouth 2 (two) times daily. 11/27/16   [provider]  oxyCODONE (OXY IR/ROXICODONE) 5 MG immediate release tablet Take 1 tablet by mouth 4 (four) times daily as needed. 11/27/16   [provider]  pantoprazole (PROTONIX) 40 MG tablet Take 1 tablet (40 mg total) by mouth 2 (two) times daily. 12/14/15   Auburn Bilberry, MD  potassium chloride SA (K-DUR,KLOR-CON) 20 MEQ tablet Take 1 tablet by mouth 2 (two) times daily. 11/27/16   [provider]  spironolactone (ALDACTONE) 25 MG tablet Take 1 tablet by mouth daily. 11/27/16   [provider]  traZODone (DESYREL) 100 MG tablet Take 1 tablet (100 mg total) by mouth at bedtime. 12/14/15   Auburn Bilberry, MD  venlafaxine XR (EFFEXOR-XR) 150 MG 24 hr capsule Take 1 capsule (150 mg total) by mouth daily with breakfast. 12/14/15   Auburn Bilberry, MD    Allergies Aspirin; Tramadol; Acetaminophen; Latex; Codeine; and Vicodin [hydrocodone-acetaminophen]  Family History  Problem Relation Age of Onset  . CAD Mother   . Diabetes  Mother   . Lung cancer Father     Social History Social History  Substance Use Topics  . Smoking status: Current Every Day Smoker    Packs/day: 1.00    Types: Cigarettes  . Smokeless tobacco: Never Used     Comment: SMOKES 3-4 PKS/DAY  . Alcohol use Yes     Comment: Drinks occasionally every week    Review of Systems Constitutional: No fever/chills Eyes: No visual changes. ENT: No sore throat. Cardiovascular: Positive for chest pain.  Respiratory: Denies shortness of breath. Gastrointestinal: No abdominal pain.  No nausea, no vomiting.  No diarrhea.   Genitourinary: Negative for dysuria. Musculoskeletal: Negative for back pain. Skin: Negative for rash. Neurological: Negative for headaches, focal weakness or numbness.  ____________________________________________   PHYSICAL EXAM:  VITAL SIGNS: ED Triage Vitals  Enc Vitals Group     BP 01/03/17 1545 (!) 180/159     Pulse Rate 01/03/17 1545 (!) 107     Resp 01/03/17 1545 18     Temp 01/03/17 1545 98.7 F (37.1 C)     Temp Source 01/03/17 1545 Oral     SpO2 01/03/17 1545 98 %     Weight 01/03/17 1542 194 lb (88 kg)     Height 01/03/17 1542 5\' 4"  (1.626 m)     Head Circumference --      Peak Flow --      Pain Score 01/03/17 1541 10   Constitutional: Alert and oriented. Well appearing and in no distress. Eyes: Conjunctivae are normal.  ENT   Head: Normocephalic and atraumatic.   Nose: No congestion/rhinnorhea.   Mouth/Throat: Mucous membranes are moist.   Neck: No stridor. Hematological/Lymphatic/Immunilogical: No cervical lymphadenopathy. Cardiovascular: Normal rate, regular rhythm.  No murmurs, rubs, or gallops.  Respiratory: Normal respiratory effort without tachypnea nor retractions. Breath sounds are clear and equal bilaterally. No wheezes/rales/rhonchi. Gastrointestinal: Soft and non tender. No rebound. No guarding.  Genitourinary: Deferred Musculoskeletal: Normal range of motion in all  extremities. Positive for bilateral lower extremity edema.  Neurologic:  Normal speech and language. No gross focal neurologic deficits are appreciated.  Skin:  Surgical incision site over sternum. Small amount of drainage at lower aspect. No surrounding erythema. Some erythema noted to bilateral lower legs.  Psychiatric: Mood and affect are normal. Speech and behavior are normal. Patient exhibits appropriate insight and judgment.  ____________________________________________    LABS (pertinent positives/negatives)  Labs Reviewed  LACTIC ACID, PLASMA - Abnormal; Notable for the following:       Result Value   Lactic Acid, Venous 2.1 (*)    All other components within normal limits  MAGNESIUM - Abnormal; Notable for the following:    Magnesium 1.4 (*)    All other components within normal limits  CULTURE, BLOOD (ROUTINE X 2)  CULTURE, BLOOD (ROUTINE X 2)  LACTIC ACID, PLASMA  CBC WITH DIFFERENTIAL/PLATELET  TYPE AND SCREEN  PREPARE RBC (CROSSMATCH)     ____________________________________________   EKG  I, Phineas Semen, attending physician, personally viewed and interpreted this EKG  EKG  Time: 1542 Rate: 102 Rhythm: sinus tachycardia Axis: normal Intervals: qtc 542 QRS: RBBB ST changes: no st elevation Impression: abnormal ekg   ____________________________________________    RADIOLOGY  CT chest IMPRESSION: 1. Interim removal of sternal sutures since 12/03/2016 radiograph. Large sternal defect, spanning the length of sternum, which is filled with fluid. Multiple gas and fluid collections which appear contiguous with the sternal defect and fluid with overlying soft tissue stranding and thickening, concerning for abscess. Additional separate fluid collection in the subcutaneous soft tissues of the anterior inferior chest wall on the right. Cut edges of the sternum appear slightly irregular and fragmented raising concern for osteomyelitis. Fluid and gas  collection extend into the anterior, superior aspect of the mediastinum and there is mild soft tissue stranding within the anterior aspect of the mediastinum, concerning for mediastinal infection. 2. Complex fluid collection at the apex of left lung which appears extrapleural. This appears to be associated with left first and second rib fractures. Uncertain if the pleural collection represents hematoma or additional extrapleural abscess, fractures in the first and second ribs could be pathologic, i.e. related to osteomyelitis given findings in the sternum. 3. Small left greater than right pleural effusions. Left lower lobe subpleural nodular focus of airspace disease, nonspecific although small septic embolus is a consideration. 4. Cirrhosis   ____________________________________________   PROCEDURES  Procedures  CRITICAL CARE Performed by: Phineas SemenGOODMAN, Merlen Gurry   Total critical care time: 45 minutes  Critical care time was exclusive of separately billable procedures and treating other patients.  Critical care was necessary to treat or prevent imminent or life-threatening deterioration.  Critical care was time spent personally by me on the following activities: development of treatment plan with patient and/or surrogate as well as nursing, discussions with consultants, evaluation of patient's response to treatment, examination of patient, obtaining history from patient or surrogate, ordering and performing treatments and interventions, ordering and review of laboratory studies, ordering and review of radiographic studies, pulse oximetry and re-evaluation of patient's condition.  ____________________________________________   INITIAL IMPRESSION / ASSESSMENT AND PLAN / ED COURSE  Pertinent labs & imaging results that were available during my care of the patient were reviewed by me and considered in my medical decision making (see chart for details).  Patient presented to the  emergency department today because of concerns for continued chest pain at the site of previous CABG. On exam the site does not look grossly infected. It is draining some clear fluid. He patient's blood work was concerning for initial lactic acidosis and hypokalemia. Patient was also found to be anemic to 6.8.Did obtain a CT scan which did show some significant abnormalities. I did discuss directly with Dr. Kennon RoundsHaney with [facility]. He reviewed the images. He did state that he did not think that there was a significant change and the images. He was not aware of any rib fractures. Patient denies any trauma. Patient does have a appointment with him tomorrow. Patient as given potassium, magnesium, blood and dose of IV antibiotics here in the emergency department. He thought if the patient clinically okay that she could be discharged home.I discussed this with the patient. I did discuss with the patient multiple medical concerns including hypokalemia, infection and anemia. She however felt very strongly that she would like to go home. I did discuss with output or outside of a hospital setting if things were to get worse.She verbalized understanding and again stated she would like to go home. She does have close follow-up  already scheduled with Dr. Kennon Rounds tomorrow.   ____________________________________________   FINAL CLINICAL IMPRESSION(S) / ED DIAGNOSES  Final diagnoses:  Chest wall pain  Post-op pain  Hypokalemia  Hypomagnesemia  Lactic acid increased     Note: This dictation was prepared with Dragon dictation. Any transcriptional errors that result from this process are unintentional     Phineas Semen, MD 01/04/17 0006

## 2017-01-03 NOTE — ED Triage Notes (Addendum)
States CP that began yesterday, states CABG 1 month ago, pt getting abx at home to tx staph infection, states her chest was re-opened, when a home health nurse saw her today she noticed a raised area on her incision that was concerning and some drainage, pt moaning, states low grade fever at home, pt is also out of pain medicine, son also states increased leg swelling

## 2017-01-04 LAB — TYPE AND SCREEN
ABO/RH(D): O POS
Antibody Screen: NEGATIVE
Unit division: 0

## 2017-01-04 LAB — BPAM RBC
Blood Product Expiration Date: 201810032359
ISSUE DATE / TIME: 201809052123
Unit Type and Rh: 5100

## 2017-01-04 MED ORDER — POTASSIUM CHLORIDE CRYS ER 20 MEQ PO TBCR
40.0000 meq | EXTENDED_RELEASE_TABLET | Freq: Once | ORAL | Status: AC
  Administered 2017-01-04: 40 meq via ORAL
  Filled 2017-01-04: qty 2

## 2017-01-04 NOTE — Discharge Instructions (Addendum)
Please seek medical attention for any high fevers, chest pain, shortness of breath, change in behavior, persistent vomiting, bloody stool or any other new or concerning symptoms.  

## 2017-01-08 LAB — CULTURE, BLOOD (ROUTINE X 2)
Culture: NO GROWTH
Culture: NO GROWTH
SPECIAL REQUESTS: ADEQUATE
Special Requests: ADEQUATE

## 2017-01-09 DIAGNOSIS — E8809 Other disorders of plasma-protein metabolism, not elsewhere classified: Secondary | ICD-10-CM | POA: Insufficient documentation

## 2017-01-26 ENCOUNTER — Emergency Department: Payer: Medicare Other

## 2017-01-26 ENCOUNTER — Encounter: Payer: Self-pay | Admitting: Emergency Medicine

## 2017-01-26 ENCOUNTER — Emergency Department
Admission: EM | Admit: 2017-01-26 | Discharge: 2017-01-27 | Disposition: A | Discharge status: Other Acute Inpatient Hospital | Payer: Medicare Other | Attending: Emergency Medicine | Admitting: Emergency Medicine

## 2017-01-26 DIAGNOSIS — E876 Hypokalemia: Secondary | ICD-10-CM | POA: Diagnosis not present

## 2017-01-26 DIAGNOSIS — R509 Fever, unspecified: Secondary | ICD-10-CM | POA: Insufficient documentation

## 2017-01-26 DIAGNOSIS — I119 Hypertensive heart disease without heart failure: Secondary | ICD-10-CM | POA: Diagnosis not present

## 2017-01-26 DIAGNOSIS — A419 Sepsis, unspecified organism: Secondary | ICD-10-CM

## 2017-01-26 DIAGNOSIS — E119 Type 2 diabetes mellitus without complications: Secondary | ICD-10-CM | POA: Diagnosis not present

## 2017-01-26 DIAGNOSIS — Y69 Unspecified misadventure during surgical and medical care: Secondary | ICD-10-CM | POA: Insufficient documentation

## 2017-01-26 DIAGNOSIS — T8131XA Disruption of external operation (surgical) wound, not elsewhere classified, initial encounter: Secondary | ICD-10-CM | POA: Insufficient documentation

## 2017-01-26 DIAGNOSIS — T8130XA Disruption of wound, unspecified, initial encounter: Secondary | ICD-10-CM

## 2017-01-26 DIAGNOSIS — T814XXA Infection following a procedure, initial encounter: Secondary | ICD-10-CM | POA: Diagnosis not present

## 2017-01-26 DIAGNOSIS — IMO0001 Reserved for inherently not codable concepts without codable children: Secondary | ICD-10-CM

## 2017-01-26 DIAGNOSIS — I2510 Atherosclerotic heart disease of native coronary artery without angina pectoris: Secondary | ICD-10-CM | POA: Insufficient documentation

## 2017-01-26 DIAGNOSIS — Z79899 Other long term (current) drug therapy: Secondary | ICD-10-CM | POA: Diagnosis not present

## 2017-01-26 DIAGNOSIS — F17210 Nicotine dependence, cigarettes, uncomplicated: Secondary | ICD-10-CM | POA: Diagnosis not present

## 2017-01-26 DIAGNOSIS — Z7982 Long term (current) use of aspirin: Secondary | ICD-10-CM | POA: Insufficient documentation

## 2017-01-26 DIAGNOSIS — R062 Wheezing: Secondary | ICD-10-CM | POA: Diagnosis not present

## 2017-01-26 DIAGNOSIS — T814XXD Infection following a procedure, subsequent encounter: Secondary | ICD-10-CM

## 2017-01-26 LAB — URINALYSIS, ROUTINE W REFLEX MICROSCOPIC
Bilirubin Urine: NEGATIVE
Glucose, UA: NEGATIVE mg/dL
Hgb urine dipstick: NEGATIVE
KETONES UR: NEGATIVE mg/dL
LEUKOCYTES UA: NEGATIVE
NITRITE: NEGATIVE
PH: 5 (ref 5.0–8.0)
PROTEIN: NEGATIVE mg/dL
Specific Gravity, Urine: 1.011 (ref 1.005–1.030)

## 2017-01-26 LAB — COMPREHENSIVE METABOLIC PANEL
ALT: 8 U/L — ABNORMAL LOW (ref 14–54)
ANION GAP: 11 (ref 5–15)
AST: 148 U/L — ABNORMAL HIGH (ref 15–41)
Albumin: 1.8 g/dL — ABNORMAL LOW (ref 3.5–5.0)
Alkaline Phosphatase: 198 U/L — ABNORMAL HIGH (ref 38–126)
BILIRUBIN TOTAL: 0.9 mg/dL (ref 0.3–1.2)
BUN: 12 mg/dL (ref 6–20)
CO2: 26 mmol/L (ref 22–32)
Calcium: 6.4 mg/dL — CL (ref 8.9–10.3)
Chloride: 96 mmol/L — ABNORMAL LOW (ref 101–111)
Creatinine, Ser: 0.92 mg/dL (ref 0.44–1.00)
Glucose, Bld: 120 mg/dL — ABNORMAL HIGH (ref 65–99)
POTASSIUM: 3.1 mmol/L — AB (ref 3.5–5.1)
Sodium: 133 mmol/L — ABNORMAL LOW (ref 135–145)
TOTAL PROTEIN: 5.9 g/dL — AB (ref 6.5–8.1)

## 2017-01-26 LAB — CBC WITH DIFFERENTIAL/PLATELET
BAND NEUTROPHILS: 0 %
BLASTS: 0 %
Basophils Absolute: 0 10*3/uL (ref 0–0.1)
Basophils Relative: 0 %
EOS ABS: 0.1 10*3/uL (ref 0–0.7)
Eosinophils Relative: 2 %
HEMATOCRIT: 27.8 % — AB (ref 35.0–47.0)
Hemoglobin: 8.9 g/dL — ABNORMAL LOW (ref 12.0–16.0)
LYMPHS PCT: 14 %
Lymphs Abs: 0.9 10*3/uL — ABNORMAL LOW (ref 1.0–3.6)
MCH: 22.5 pg — ABNORMAL LOW (ref 26.0–34.0)
MCHC: 32.1 g/dL (ref 32.0–36.0)
MCV: 70 fL — AB (ref 80.0–100.0)
METAMYELOCYTES PCT: 0 %
MONOS PCT: 4 %
Monocytes Absolute: 0.2 10*3/uL (ref 0.2–0.9)
Myelocytes: 0 %
NEUTROS ABS: 4.9 10*3/uL (ref 1.4–6.5)
Neutrophils Relative %: 80 %
OTHER: 0 %
PROMYELOCYTES ABS: 0 %
Platelets: 133 10*3/uL — ABNORMAL LOW (ref 150–440)
RBC: 3.96 MIL/uL (ref 3.80–5.20)
RDW: 22.1 % — AB (ref 11.5–14.5)
WBC: 6.1 10*3/uL (ref 3.6–11.0)
nRBC: 0 /100 WBC

## 2017-01-26 LAB — C DIFFICILE QUICK SCREEN W PCR REFLEX
C Diff antigen: POSITIVE — AB
C Diff toxin: NEGATIVE

## 2017-01-26 LAB — CLOSTRIDIUM DIFFICILE BY PCR: CDIFFPCR: POSITIVE — AB

## 2017-01-26 LAB — TROPONIN I

## 2017-01-26 LAB — LACTIC ACID, PLASMA
LACTIC ACID, VENOUS: 2.8 mmol/L — AB (ref 0.5–1.9)
LACTIC ACID, VENOUS: 2.4 mmol/L — AB (ref 0.5–1.9)

## 2017-01-26 MED ORDER — SODIUM CHLORIDE 0.9 % IV BOLUS (SEPSIS)
1000.0000 mL | Freq: Once | INTRAVENOUS | Status: AC
  Administered 2017-01-26: 1000 mL via INTRAVENOUS

## 2017-01-26 MED ORDER — LORAZEPAM 2 MG/ML IJ SOLN: 0.5000 mg | Freq: Once | INTRAMUSCULAR | Status: DC

## 2017-01-26 MED ORDER — MORPHINE SULFATE (PF) 4 MG/ML IV SOLN
INTRAVENOUS | Status: AC
  Administered 2017-01-26: 4 mg via INTRAVENOUS
  Filled 2017-01-26: qty 1

## 2017-01-26 MED ORDER — VANCOMYCIN HCL 10 G IV SOLR
1250.0000 mg | Freq: Two times a day (BID) | INTRAVENOUS | Status: DC
  Filled 2017-01-26 (×2): qty 1250

## 2017-01-26 MED ORDER — PIPERACILLIN-TAZOBACTAM 3.375 G IVPB 30 MIN
INTRAVENOUS | Status: AC
  Administered 2017-01-26: 3.375 g via INTRAVENOUS
  Filled 2017-01-26: qty 50

## 2017-01-26 MED ORDER — OXYCODONE-ACETAMINOPHEN 5-325 MG PO TABS
1.0000 | ORAL_TABLET | Freq: Once | ORAL | Status: AC
  Administered 2017-01-26: 1 via ORAL
  Filled 2017-01-26: qty 1

## 2017-01-26 MED ORDER — ACETAMINOPHEN 500 MG PO TABS: 1000.0000 mg | ORAL_TABLET | Freq: Once | ORAL | Status: DC

## 2017-01-26 MED ORDER — POTASSIUM CHLORIDE 10 MEQ/100ML IV SOLN
10.0000 meq | Freq: Once | INTRAVENOUS | Status: AC
  Administered 2017-01-26: 10 meq via INTRAVENOUS
  Filled 2017-01-26: qty 100

## 2017-01-26 MED ORDER — LORAZEPAM 2 MG/ML IJ SOLN
0.5000 mg | Freq: Once | INTRAMUSCULAR | Status: AC
  Administered 2017-01-26: 0.5 mg via INTRAVENOUS
  Filled 2017-01-26: qty 1

## 2017-01-26 MED ORDER — VANCOMYCIN HCL IN DEXTROSE 1-5 GM/200ML-% IV SOLN
1000.0000 mg | Freq: Once | INTRAVENOUS | Status: AC
  Administered 2017-01-26: 1000 mg via INTRAVENOUS
  Filled 2017-01-26: qty 200

## 2017-01-26 MED ORDER — PIPERACILLIN-TAZOBACTAM 3.375 G IVPB 30 MIN
3.3750 g | Freq: Once | INTRAVENOUS | Status: AC
  Administered 2017-01-26: 3.375 g via INTRAVENOUS

## 2017-01-26 MED ORDER — VANCOMYCIN HCL IN DEXTROSE 1-5 GM/200ML-% IV SOLN
1000.0000 mg | Freq: Two times a day (BID) | INTRAVENOUS | Status: DC
  Administered 2017-01-26 – 2017-01-27 (×2): 1000 mg via INTRAVENOUS
  Filled 2017-01-26 (×2): qty 200

## 2017-01-26 MED ORDER — PIPERACILLIN-TAZOBACTAM 3.375 G IVPB
3.3750 g | Freq: Three times a day (TID) | INTRAVENOUS | Status: DC
  Administered 2017-01-26 (×2): 3.375 g via INTRAVENOUS
  Filled 2017-01-26 (×4): qty 50

## 2017-01-26 MED ORDER — MORPHINE SULFATE (PF) 4 MG/ML IV SOLN
INTRAVENOUS | Status: AC
  Filled 2017-01-26: qty 1

## 2017-01-26 MED ORDER — PIPERACILLIN-TAZOBACTAM 3.375 G IVPB 30 MIN
INTRAVENOUS | Status: AC
  Filled 2017-01-26: qty 50

## 2017-01-26 MED ORDER — MORPHINE SULFATE (PF) 4 MG/ML IV SOLN
4.0000 mg | Freq: Once | INTRAVENOUS | Status: AC
  Administered 2017-01-26: 4 mg via INTRAVENOUS

## 2017-01-26 MED ORDER — LORAZEPAM 2 MG/ML IJ SOLN: 0.5000 mg | INTRAMUSCULAR | Status: DC | PRN

## 2017-01-26 MED ORDER — IOPAMIDOL (ISOVUE-300) INJECTION 61%
75.0000 mL | Freq: Once | INTRAVENOUS | Status: AC | PRN
  Administered 2017-01-26: 75 mL via INTRAVENOUS

## 2017-01-26 MED ORDER — POTASSIUM CHLORIDE 10 MEQ/100ML IV SOLN
10.0000 meq | Freq: Once | INTRAVENOUS | Status: AC
  Administered 2017-01-26: 10 meq via INTRAVENOUS
  Filled 2017-01-26 (×2): qty 100

## 2017-01-26 MED ORDER — CALCIUM GLUCONATE 10 % IV SOLN
1.0000 g | Freq: Once | INTRAVENOUS | Status: AC
  Administered 2017-01-26: 1 g via INTRAVENOUS
  Filled 2017-01-26: qty 10

## 2017-01-26 MED ORDER — VANCOMYCIN 50 MG/ML ORAL SOLUTION
125.0000 mg | Freq: Four times a day (QID) | ORAL | Status: DC
  Administered 2017-01-26: 125 mg via ORAL
  Filled 2017-01-26 (×4): qty 2.5

## 2017-01-26 NOTE — ED Notes (Signed)
Unsuccessful blood draw from existing IV. attempted to draw blood from another site, unsuccessful. Lab called to collect blood.

## 2017-01-26 NOTE — ED Notes (Signed)
Resumed care from [place], [place]. Pt resting comfortably with family at bedside. Pt requests that we use PICC line for future blood draws.

## 2017-01-26 NOTE — ED Triage Notes (Signed)
Pt arrived via ems from home with complaints on new onset of fever. Pt recently had surgery at [facility] and has an open incision that pt reports happended yesterday.

## 2017-01-26 NOTE — ED Notes (Signed)
ED Provider at bedside. 

## 2017-01-26 NOTE — ED Notes (Signed)
Pt cleansed of large amount of liquid yellow stool. Linens changed, gown changed. Additional warm blankets provided. Pt updated on delay in transfer. Pt is requesting pain medication and ativan for "my nerves". Call bell at right side. Bed in low position and siderails up x2.

## 2017-01-26 NOTE — ED Notes (Signed)
Son, Dartha Lodge, signed consent for transfer as he has to leave.

## 2017-01-26 NOTE — Progress Notes (Signed)
Pharmacy Antibiotic Note  Meghan Jimenez is a 56 y.o. female admitted on 01/26/2017 with sepsis.  Pharmacy has been consulted for vanc/zosyn dosing.  Plan: Patient received vanc 1g IV and zosyn 3.375g IV x 1  Initial order for vancomycin 1250 mg IV q12h. Expected trough >20. Will order vancomycin 1000 mg IV q12h w/ 6 hour stack dose. Will draw vanc trough 9/29 @ 2300 prior to 4th dose. Will continue zosyn 3.375g IV q8h extended infusion dosing.  Ke 0.052 T1/2  ~ 12 hrs Goal trough 15 - 20 mcg/mL.  Height:  (162.6 cm) Weight: 195 lb 1.6 oz (88.5 kg) IBW/kg (Calculated) : 54.7  Temp (24hrs), Avg:99.4 F (37.4 C), Min:98.4 F (36.9 C), Max:101.2 F (38.4 C)   Recent Labs Lab 01/26/17 0323 01/26/17 0351 01/26/17 0728  WBC 6.1  --   --   CREATININE 0.92  --   --   LATICACIDVEN  --  2.8* 2.4*    Estimated Creatinine Clearance: 71.8 mL/min (by C-G formula based on SCr of 0.92 mg/dL).    Allergies  Allergen Reactions  . Aspirin Anaphylaxis and Palpitations    Patient takes ibuprofen as home med, so no problem with NSAIDs  . Tramadol Anaphylaxis  . Acetaminophen     Other reaction(s): Unknown  . Latex   . Codeine Rash  . Vicodin [Hydrocodone-Acetaminophen] Palpitations    Thank you for allowing pharmacy to be a part of this patient's care.  Crist Fat, PharmD, BCPS Clinical Pharmacist 01/26/2017 10:27 AM

## 2017-01-26 NOTE — ED Notes (Addendum)
Pt with bilateral chest drains draining purulent sanginous drainage. Pt with wound, tunneling noted to mid chest. Pt with sutures noted to mid chest and upper abd, redness noted around sutures.. Pt is moving independently in bed without difficulty. Pt states she is tired of waiting for transport to [facility]. Pt states "can't they just take me". Explanation of transfer process again explained to pt. Pt reminded to not pull at drains and to stop removing dressing that was placed by this rn to center chest. Pt states she does take oxygen at home "when I feel like it". Pt states she has pain to chest where drains are placed. Spoke with dr. Fanny Bien regarding pt's request for pain medication, no new order for pain meds received.

## 2017-01-26 NOTE — ED Provider Notes (Addendum)
The patient is awake and alert. She reports ongoing drainage, and she has mild drainage noted from the lower portion of her midsternal incision. Her Clostridium difficile test has become positive. We will add additional antibiotic for this. She is awake and alert, hemodynamics are improving from presentation. patient continues to await a bed available at [facility]. we have called and spoke with the [facility] transfer center and the patient is pending admission to [facility] at this time  Patient agreeable and understanding of delayed due to that availability at [facility]. She remains alert and oriented and in no distress.   Sharyn Creamer, MD 01/26/17 1743   Vitals:   01/26/17 2130 01/26/17 2200  BP:    Pulse: (!) 122 (!) 122  Resp: (!) 21 (!) 28  Temp:    SpO2: 96% 96%    ----------------------------------------- 10:47 PM on 01/26/2017 -----------------------------------------  Patient alert, reports he feels anxious about transport to [facility]. Well oriented, in no distress. Remained slightly tachycardic. Discussed with patient, she appears stable for transport via [facility]. Reevaluation completed at this time. We'll provide additional small amount of Ativan to assist and anxiety relief.   Sharyn Creamer, MD 01/26/17 2248   ----------------------------------------- 12:00 AM on 01/27/2017 -----------------------------------------  Ongoing care assigned Dr. Zenda Alpers. In brief patient being transferred to [facility] for concerns of C. difficile and possible dehiscence with wound infection and possible mediastinitis after CABG. Patient currently awaiting transport via either [facility] or [facility] team.    Sharyn Creamer, MD 01/27/17 0001

## 2017-01-26 NOTE — ED Notes (Signed)
Called lab for collection of lactic acid re-test.

## 2017-01-26 NOTE — ED Notes (Signed)
Pt refuses to leave monitoring equipment in place. This rn into room approx every 10-20 minutes to encourage pt to leave cardiac leads and cont pox in place.

## 2017-01-26 NOTE — ED Notes (Signed)
Pt assisted with bedpan to void and have small amount of liquid stool. Pt's daughter upset pt has not been transferred to [facility] at this time. Pt's daughter on phone with [facility] attempting to secure pt a bed.

## 2017-01-26 NOTE — ED Notes (Signed)
Lab at bedside

## 2017-01-26 NOTE — ED Notes (Signed)
Received bed assignment from [facility] at [facility] transport has been called.

## 2017-01-26 NOTE — ED Notes (Signed)
Pt's daughter here. Pt's dautgher updated on need for c diff precautions. Pt's daughter verbalizes and demonstrates understanding of c diff precautions.

## 2017-01-26 NOTE — Progress Notes (Signed)
Pharmacy Antibiotic Note  Meghan Jimenez is a 56 y.o. female admitted on 01/26/2017 with sepsis.  Pharmacy has been consulted for vanc/zosyn dosing.  Plan: Patient received vanc 1g IV and zosyn 3.375g IV x 1  Will continue w/ vanc 1.25g IV w/ 6 hour stack dose. Will draw vanc trough 9/29 @ 2300 prior to 4th dose. Will continue zosyn 3.375g IV q8h extended infusion dosing.  Ke 8.11914 T1/2 10 ~ 12 hrs Goal trough 15 - 20 mcg/mL.  Height:  (162.6 cm) Weight: 195 lb 1.6 oz (88.5 kg) IBW/kg (Calculated) : 54.7  Temp (24hrs), Avg:99.9 F (37.7 C), Min:98.6 F (37 C), Max:101.2 F (38.4 C)   Recent Labs Lab 01/26/17 0323 01/26/17 0351  WBC 6.1  --   CREATININE 0.92  --   LATICACIDVEN  --  2.8*    Estimated Creatinine Clearance: 71.8 mL/min (by C-G formula based on SCr of 0.92 mg/dL).    Allergies  Allergen Reactions  . Aspirin Anaphylaxis and Palpitations    Patient takes ibuprofen as home med, so no problem with NSAIDs  . Tramadol Anaphylaxis  . Acetaminophen     Other reaction(s): Unknown  . Latex   . Codeine Rash  . Vicodin [Hydrocodone-Acetaminophen] Palpitations    Thank you for allowing pharmacy to be a part of this patient's care.  Thomasene Ripple, PharmD, BCPS Clinical Pharmacist 01/26/2017

## 2017-01-26 NOTE — ED Provider Notes (Signed)
[facility] Emergency Department Provider Note   ____________________________________________   First MD Initiated Contact with Patient 01/26/17 [phone]     (approximate)  I have reviewed the triage vital signs and the nursing notes.   HISTORY  Chief Complaint Fever    HPI Meghan Jimenez is a 56 y.o. female brought to the ED from home via EMS with a chief complaint of fever and wound dehiscence. 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 Her healing process was complicated by mediastinitis staph sternal wound infection which was managed with debridement and pek advancement flaps. She returned for fluid collection anterior to her sternum and was taken to the OR for wound exploration and omental flap 01/08/2017. She was discharged home with PICC line with IV Ancef every 8 hours. states her home health nurse checked on her yesterday and she was fine. Subsequently developed a fever in the evening to 101F. Also reports the bottom one or 2 sutures of her sternal incision has opened up and "leaking pus". Denies cough, shortness of breath, abdominal pain, nausea, vomiting, dysuria. Denies recent travel or trauma. Family tells me that patient has continued to smoke heavily and is not wearing her support bra as directed.   Past Medical History:  Diagnosis Date  . CAD (coronary artery disease)   . CHF (congestive heart failure) (HCC)   . Cirrhosis (HCC)   . COPD (chronic obstructive pulmonary disease) (HCC)   . Diabetes mellitus without complication (HCC)   . Hepatitis C   . Hypertension     Patient Active Problem List   Diagnosis Date Noted  . Chest pain 11/12/2016  . PTSD (post-traumatic stress disorder) 12/13/2015  . Dysthymia 12/13/2015  . GIB (gastrointestinal bleeding) 12/10/2015  . Unstable angina (HCC) 11/11/2015    Past Surgical History:  Procedure Laterality Date  . BACK SURGERY    . CARDIAC SURGERY    .  ESOPHAGOGASTRODUODENOSCOPY (EGD) WITH PROPOFOL N/A 12/11/2015   Procedure: ESOPHAGOGASTRODUODENOSCOPY (EGD) WITH PROPOFOL;  Surgeon: [name], MD;  Location: [facility] ENDOSCOPY;  Service: Gastroenterology;  Laterality: N/A;  . LEFT HEART CATH AND CORONARY ANGIOGRAPHY N/A 11/13/2016   Procedure: Left Heart Cath and Coronary Angiography;  Surgeon: [name], MD;  Location: [facility] INVASIVE CV LAB;  Service: Cardiovascular;  Laterality: N/A;    Prior to Admission medications   Medication Sig Start Date End Date Taking? Authorizing Provider  aspirin EC 81 MG tablet Take 81 mg by mouth daily.   Yes [provider]  atorvastatin (LIPITOR) 40 MG tablet Take 40 mg by mouth daily. 11/27/16  Yes [provider]  ceFAZolin (ANCEF) 1 g injection 2 g by Other route every 8 (eight) hours.  01/23/17  Yes [provider]  diphenhydrAMINE (BENADRYL) 50 MG capsule Take 50 mg by mouth every 6 (six) hours as needed.   Yes [provider]  gabapentin (NEURONTIN) 300 MG capsule Take 1 capsule (300 mg total) by mouth 3 (three) times daily. 12/14/15  Yes Auburn Bilberry, MD  hydrOXYzine (ATARAX/VISTARIL) 10 MG tablet Take 1 tablet (10 mg total) by mouth 3 (three) times daily as needed. 12/14/15  Yes Auburn Bilberry, MD  metoprolol tartrate (LOPRESSOR) 25 MG tablet Take 25 mg by mouth 2 (two) times daily. 11/27/16  Yes [provider]  oxyCODONE (OXY IR/ROXICODONE) 5 MG immediate release tablet Take 1 tablet by mouth 4 (four) times daily as needed. 11/27/16  Yes [provider]  potassium chloride SA (  K-DUR,KLOR-CON) 20 MEQ tablet Take 1 tablet by mouth 2 (two) times daily. 11/27/16  Yes [provider]  torsemide (DEMADEX) 20 MG tablet Take 3 tablets by mouth 2 (two) times daily. 01/12/17  Yes [provider]  traZODone (DESYREL) 100 MG tablet Take 1 tablet (100 mg total) by mouth at bedtime. 12/14/15  Yes Auburn Bilberry, MD    Allergies Aspirin;  Tramadol; Acetaminophen; Latex; Codeine; and Vicodin [hydrocodone-acetaminophen]  Family History  Problem Relation Age of Onset  . CAD Mother   . Diabetes Mother   . Lung cancer Father     Social History Social History  Substance Use Topics  . Smoking status: Current Every Day Smoker    Packs/day: 1.00    Types: Cigarettes  . Smokeless tobacco: Never Used     Comment: SMOKES 3-4 PKS/DAY  . Alcohol use Yes     Comment: Drinks occasionally every week    Review of Systems  Constitutional: Positive for fever/chills. Eyes: No visual changes. ENT: No sore throat. Cardiovascular: Positive for chest pain. Respiratory: Denies shortness of breath. Gastrointestinal: No abdominal pain.  No nausea, no vomiting.  No diarrhea.  No constipation. Genitourinary: Negative for dysuria. Musculoskeletal: Negative for back pain. Skin: Negative for rash. Neurological: Negative for headaches, focal weakness or numbness.   ____________________________________________   PHYSICAL EXAM:  VITAL SIGNS: ED Triage Vitals [01/26/17 0325]  Enc Vitals Group     BP      Pulse      Resp      Temp      Temp src      SpO2      Weight 194 lb (88 kg)     Height  (1.626 m)     Head Circumference      Peak Flow      Pain Score 10     Pain Loc      Pain Edu?      Excl. in GC?    Constitutional: Alert and oriented. Well appearing and in no acute distress. Eyes: Conjunctivae are normal. PERRL. EOMI. Head: Atraumatic. Nose: No congestion/rhinnorhea. Mouth/Throat: Mucous membranes are moist.  Oropharynx non-erythematous. Neck: No stridor.   Cardiovascular: Tachycardic rate, regular rhythm. Grossly normal heart sounds.  Good peripheral circulation. Respiratory: Normal respiratory effort.  No retractions. Lungs CTAB. Midline sutures in place with the exception of the inferior 2 sutures. There is a small area of wound dehiscence with serosanguineous fluid. Purulent like material exposed. 2 drains  in place with minimal drainage. Gastrointestinal: Soft and nontender to light or deep palpation. No distention. No abdominal bruits. No CVA tenderness. Midline incisions to abdomen C/D/I. Musculoskeletal: No lower extremity tenderness nor edema.  No joint effusions. Neurologic:  Normal speech and language. No gross focal neurologic deficits are appreciated.  Skin:  Skin is warm, dry and intact. No rash noted. No petechiae. Psychiatric: Mood and affect are normal. Speech and behavior are normal.  ____________________________________________   LABS (all labs ordered are listed, but only abnormal results are displayed)  Labs Reviewed  CBC WITH DIFFERENTIAL/PLATELET - Abnormal; Notable for the following:       Result Value   Hemoglobin 8.9 (*)    HCT 27.8 (*)    MCV 70.0 (*)    MCH 22.5 (*)    RDW 22.1 (*)    Platelets 133 (*)    Lymphs Abs 0.9 (*)    All other components within normal limits  COMPREHENSIVE METABOLIC PANEL - Abnormal; Notable for  the following:    Sodium 133 (*)    Potassium 3.1 (*)    Chloride 96 (*)    Glucose, Bld 120 (*)    Calcium 6.4 (*)    Total Protein 5.9 (*)    Albumin 1.8 (*)    AST 148 (*)    ALT 8 (*)    Alkaline Phosphatase 198 (*)    All other components within normal limits  URINALYSIS, ROUTINE W REFLEX MICROSCOPIC - Abnormal; Notable for the following:    Color, Urine YELLOW (*)    APPearance CLEAR (*)    All other components within normal limits  LACTIC ACID, PLASMA - Abnormal; Notable for the following:    Lactic Acid, Venous 2.8 (*)    All other components within normal limits  CULTURE, BLOOD (ROUTINE X 2)  CULTURE, BLOOD (ROUTINE X 2)  URINE CULTURE  AEROBIC/ANAEROBIC CULTURE (SURGICAL/DEEP WOUND)  TROPONIN I  LACTIC ACID, PLASMA   ____________________________________________  EKG  ED ECG REPORT I, SUNG,JADE J, the attending physician, personally viewed and interpreted this ECG.   Date: 01/26/2017  EKG Time: 0338  Rate: 128   Rhythm: sinus tachycardia  Axis: normal  Intervals:none  ST&T Change: nonspecific  ____________________________________________  RADIOLOGY  Ct Chest W Contrast  Result Date: 01/26/2017 CLINICAL DATA:  Fever. History of mediastinitis. On antibiotics. Recent surgery at an outside institution with open incision. EXAM: CT CHEST WITH CONTRAST TECHNIQUE: Multidetector CT imaging of the chest was performed during intravenous contrast administration. CONTRAST:  75mL ISOVUE-300 IOPAMIDOL (ISOVUE-300) INJECTION 61% COMPARISON:  Chest CT 01/03/2017 FINDINGS: Cardiovascular: Post CABG. Atherosclerosis of the thoracic aorta without aneurysm. Calcification of native coronary artery is. Improved pericardial fluid from prior exam. Right upper extremity venous catheter with tip in the SVC. Mediastinum/Nodes: Anterior mediastinal stranding has improved from prior exam. Fluid within the mid sternal wound, no additional mediastinal small mediastinal and prevascular nodes, improved from prior exam. Esophagus is decompressed. Fluid collection. Lungs/Pleura: Peripherally enhancing left apical extrapleural fluid collection has decreased in size from prior exam. Resolved dependent left pleural effusion. Resolved right pleural effusion from prior exam. Subpleural paramediastinal opacity in the anterior left upper lobe has improved. Streaky atelectasis throughout both lungs. No pneumothorax. Upper Abdomen: Hepatic cirrhosis with small volume upper abdominal ascites. Splenomegaly. Probable splenic infarct peripherally, unchanged. Musculoskeletal: Post sternotomy and removal of sternal wires with fluid in the sternotomy bed. Irregularity of sternal borders again seen. Surgical drains in the anterior chest wall with scattered air, ill-defined stranding and minimal fluid, no well-defined subcutaneous collection. Skin defect in the midline of the anterior chest. Left posterior first rib fracture with irregularity again seen.  Irregularity of the lateral left second rib again seen. IMPRESSION: 1. Improved appearance of sternal wound infection from prior exam. Surgical drains in the subcutaneous tissues anteriorly with residual scattered air, soft tissue stranding and small amount of fluid, primarily in the sternal bed. 2. Improved appearance of left apical extrapleural fluid collection with adjacent first and second rib irregularity, osteomyelitis and abscess is again considered. 3. Resolved pleural effusions. Scattered linear atelectasis in both lungs. 4. Cirrhosis and splenomegaly.  Small peripheral splenic infarct. Aortic Atherosclerosis (ICD10-I70.0). Electronically Signed   By: Rubye Oaks M.D.   On: 01/26/2017 06:19   Dg Chest Port 1 View  Result Date: 01/26/2017 CLINICAL DATA:  56 y/o  F; fever with recent heart surgery. EXAM: PORTABLE CHEST 1 VIEW COMPARISON:  01/03/2017 CT chest.  12/03/2016 chest radiograph. FINDINGS: Stable cardiac silhouette. The right PICC  line tip projects over lower SVC. Status post CABG. Linear opacity in left mid lung zone is compatible with atelectasis. No new focal consolidation of the lungs. The the sternum dehiscence on prior CT is not appreciable on frontal AP radiograph. Visualized bones are unremarkable. Anterior cervical fusion hardware noted. IMPRESSION: Stable cardiac silhouette. Left mid lung zone platelike atelectasis. No new focal consolidation of the lungs. Electronically Signed   By: Mitzi Hansen M.D.   On: 01/26/2017 04:12    ____________________________________________   PROCEDURES  Procedure(s) performed: None  Procedures  Critical Care performed: Yes, see critical care note(s)   CRITICAL CARE Performed by: Irean Hong   Total critical care time: 45 minutes  Critical care time was exclusive of separately billable procedures and treating other patients.  Critical care was necessary to treat or prevent imminent or life-threatening  deterioration.  Critical care was time spent personally by me on the following activities: development of treatment plan with patient and/or surrogate as well as nursing, discussions with consultants, evaluation of patient's response to treatment, examination of patient, obtaining history from patient or surrogate, ordering and performing treatments and interventions, ordering and review of laboratory studies, ordering and review of radiographic studies, pulse oximetry and re-evaluation of patient's condition.  ____________________________________________   INITIAL IMPRESSION / ASSESSMENT AND PLAN / ED COURSE  Pertinent labs & imaging results that were available during my care of the patient were reviewed by me and considered in my medical decision making (see chart for details).  56 year old female with recent sternal debridement on IV antibiotics through PICC line who presents with fever and wound dehiscence. Code sepsis initiated. Will also obtain wound culture. Will obtain CT chest for concern of recurrent mediastinitis.  Clinical Course as of Jan 26 658  Fri Jan 26, 2017  1610 Currently afebrile. Heart rate still in the 120s. Normal tensive. Room air saturations 98%. Awaiting results of CT chest.  [JS]  [phone] Updated patient and family members of CT imaging results. Will discuss with CT surgery at [facility].  [JS]  [id number] Patient was accepted directly by Dr. Kennon Rounds. Will be transported once she has a bed assignment. Patient and family updated. Patient is tearful and upset; requesting anxiolytic.  [JS]    Clinical Course User Index [JS] Irean Hong, MD     ____________________________________________   FINAL CLINICAL IMPRESSION(S) / ED DIAGNOSES  Final diagnoses:  Fever, unspecified fever cause  Hypocalcemia  Hypokalemia  Sepsis, due to unspecified organism (HCC)  Wound dehiscence  Postoperative wound infection, subsequent encounter      NEW MEDICATIONS STARTED DURING THIS  VISIT:  New Prescriptions   No medications on file     Note:  This document was prepared using Dragon voice recognition software and may include unintentional dictation errors.    Irean Hong, MD 01/26/17 [phone]

## 2017-01-26 NOTE — ED Notes (Signed)
Pt and daughter state they maybe would like to cancel transfer to [facility], pt and daughter state they are going to talk with other family members and let staff at [facility] know after conferring with other family.

## 2017-01-26 NOTE — ED Notes (Signed)
Dr.qaule in to speak with pt and daughter.

## 2017-01-27 ENCOUNTER — Ambulatory Visit (HOSPITAL_COMMUNITY)
Admission: AD | Admit: 2017-01-27 | Discharge: 2017-01-27 | Disposition: A | Discharge status: Home / Self Care | Payer: Medicare Other | Source: Other Acute Inpatient Hospital | Attending: Emergency Medicine | Admitting: Emergency Medicine

## 2017-01-27 DIAGNOSIS — T8189XA Other complications of procedures, not elsewhere classified, initial encounter: Secondary | ICD-10-CM | POA: Diagnosis present

## 2017-01-27 DIAGNOSIS — Z951 Presence of aortocoronary bypass graft: Secondary | ICD-10-CM | POA: Insufficient documentation

## 2017-01-27 DIAGNOSIS — D62 Acute posthemorrhagic anemia: Secondary | ICD-10-CM | POA: Diagnosis present

## 2017-01-27 DIAGNOSIS — X58XXXA Exposure to other specified factors, initial encounter: Secondary | ICD-10-CM | POA: Insufficient documentation

## 2017-01-27 MED ORDER — TORSEMIDE 20 MG PO TABS
20.0000 mg | ORAL_TABLET | Freq: Every day | ORAL | Status: DC
  Administered 2017-01-27: 20 mg via ORAL
  Filled 2017-01-27: qty 1

## 2017-01-27 MED ORDER — ALBUTEROL SULFATE (2.5 MG/3ML) 0.083% IN NEBU
2.5000 mg | INHALATION_SOLUTION | Freq: Once | RESPIRATORY_TRACT | Status: AC
  Administered 2017-01-27: 2.5 mg via RESPIRATORY_TRACT

## 2017-01-27 MED ORDER — ALBUTEROL SULFATE (2.5 MG/3ML) 0.083% IN NEBU
INHALATION_SOLUTION | RESPIRATORY_TRACT | Status: AC
  Filled 2017-01-27: qty 6

## 2017-01-27 MED ORDER — METOPROLOL TARTRATE 25 MG PO TABS
25.0000 mg | ORAL_TABLET | Freq: Once | ORAL | Status: AC
  Administered 2017-01-27: 25 mg via ORAL
  Filled 2017-01-27: qty 1

## 2017-01-27 NOTE — ED Provider Notes (Signed)
-----------------------------------------   1:11 AM on 01/27/2017 -----------------------------------------   Blood pressure 135/66, pulse (!) 123, temperature 98.3 F (36.8 C), resp. rate (!) 28, height  (1.626 m), weight 88.5 kg (195 lb 1.6 oz), SpO2 98 %.  Assuming care from Dr. Fanny Bien.  In short, Meghan Jimenez is a 56 y.o. female with a chief complaint of Fever .  Refer to the original H&P for additional details.  The current plan of care is to await transfer for the patient.   Clinical Course as of Jan 27 110  Fri Jan 26, 2017  0615 Currently afebrile. Heart rate still in the 120s. Normal tensive. Room air saturations 98%. Awaiting results of CT chest.  [JS]  [phone] Updated patient and family members of CT imaging results. Will discuss with CT surgery at [facility].  [JS]  [id number] Patient was accepted directly by Dr. Kennon Rounds. Will be transported once she has a bed assignment. Patient and family updated. Patient is tearful and upset; requesting anxiolytic.  [JS]  2019 Spoke with CT surgery (Dr. Trudie Buckler (spelling?)). Reports can't accept in transfer to [facility] ED, but rather continue to wait for [facility] inpatient bed.   [MQ]    Clinical Course User Index [JS] Irean Hong, MD [MQ] Sharyn Creamer, MD   [facility] is on its way to pick up the patient. She is to And tachycardic but according to staff she has been tachycardic for the entire day. The patient also did just receive an albuterol treatment. The patient's wheezing is improved. The patient will also receive torsemide and lopressor. She will be transferred with [facility].    [name], MD 01/27/17 [phone]

## 2017-01-27 NOTE — ED Notes (Signed)
Sternal wound redressed. Pt assisted with bedpan for small amount of stool production.

## 2017-01-27 NOTE — ED Notes (Signed)
Pt with no wheezing auscultated.

## 2017-01-27 NOTE — ED Notes (Signed)
Vancomycin trough obtained before vancomycin hung.

## 2017-01-27 NOTE — ED Notes (Signed)
Pt with audible wheezing noted. Order for albuterol received.

## 2017-01-27 NOTE — ED Notes (Signed)
I have reviewed the EMTALA and it complete.

## 2017-01-27 NOTE — ED Notes (Signed)
[facility] here for transport 

## 2017-01-27 NOTE — ED Notes (Signed)
Spoke with dr. Zenda Alpers regarding pt's home medications. Order for lopressor and torsemide po received.

## 2017-01-28 LAB — URINE CULTURE: Culture: 10000 — AB

## 2017-01-31 LAB — CULTURE, BLOOD (ROUTINE X 2)
CULTURE: NO GROWTH
CULTURE: NO GROWTH
Special Requests: ADEQUATE
Special Requests: ADEQUATE

## 2017-01-31 LAB — AEROBIC/ANAEROBIC CULTURE W GRAM STAIN (SURGICAL/DEEP WOUND)

## 2017-01-31 LAB — AEROBIC/ANAEROBIC CULTURE (SURGICAL/DEEP WOUND)

## 2017-02-01 NOTE — Progress Notes (Signed)
ED Antimicrobial Stewardship Positive Culture Follow Up   Meghan Jimenez is an 56 y.o. female who presented to [facility] on 01/26/2017 with a chief complaint of  Chief Complaint  Patient presents with  . Fever    Recent Results (from the past 720 hour(s))  Blood culture (routine x 2)     Status: None   Collection Time: 01/03/17  4:25 PM  Result Value Ref Range Status   Specimen Description BLOOD BLOOD RIGHT ARM  Final   Special Requests   Final    BOTTLES DRAWN AEROBIC AND ANAEROBIC Blood Culture adequate volume   Culture NO GROWTH 5 DAYS  Final   Report Status 01/08/2017 FINAL  Final  Blood culture (routine x 2)     Status: None   Collection Time: 01/03/17  7:11 PM  Result Value Ref Range Status   Specimen Description BLOOD BLOOD RIGHT ARM  Final   Special Requests   Final    BOTTLES DRAWN AEROBIC AND ANAEROBIC Blood Culture adequate volume   Culture NO GROWTH 5 DAYS  Final   Report Status 01/08/2017 FINAL  Final  Blood Culture (routine x 2)     Status: None   Collection Time: 01/26/17  3:51 AM  Result Value Ref Range Status   Specimen Description BLOOD BLOOD RIGHT HAND  Final   Special Requests   Final    BOTTLES DRAWN AEROBIC AND ANAEROBIC Blood Culture adequate volume   Culture NO GROWTH 5 DAYS  Final   Report Status 01/31/2017 FINAL  Final  Blood Culture (routine x 2)     Status: None   Collection Time: 01/26/17  3:51 AM  Result Value Ref Range Status   Specimen Description BLOOD BLOOD LEFT HAND  Final   Special Requests   Final    BOTTLES DRAWN AEROBIC AND ANAEROBIC Blood Culture adequate volume   Culture NO GROWTH 5 DAYS  Final   Report Status 01/31/2017 FINAL  Final  Urine culture     Status: Abnormal   Collection Time: 01/26/17  3:51 AM  Result Value Ref Range Status   Specimen Description URINE, RANDOM  Final   Special Requests NONE  Final   Culture (A)  Final    10,000 COLONIES/mL VANCOMYCIN RESISTANT ENTEROCOCCUS   Report Status 01/28/2017 FINAL  Final    Organism ID, Bacteria VANCOMYCIN RESISTANT ENTEROCOCCUS (A)  Final      Susceptibility   Vancomycin resistant enterococcus - MIC*    AMPICILLIN >=32 RESISTANT Resistant     LEVOFLOXACIN >=8 RESISTANT Resistant     NITROFURANTOIN 256 RESISTANT Resistant     VANCOMYCIN >=32 RESISTANT Resistant     LINEZOLID 2 SENSITIVE Sensitive     * 10,000 COLONIES/mL VANCOMYCIN RESISTANT ENTEROCOCCUS  Aerobic/Anaerobic Culture (surgical/deep wound)     Status: None   Collection Time: 01/26/17  4:59 AM  Result Value Ref Range Status   Specimen Description CHEST  Final   Special Requests NONE  Final   Gram Stain   Final    MODERATE WBC PRESENT,BOTH PMN AND MONONUCLEAR MODERATE GRAM NEGATIVE RODS    Culture   Final    MODERATE SERRATIA MARCESCENS NO ANAEROBES ISOLATED Performed at [facility], [address] [place], [place] [address]    Report Status 01/31/2017 FINAL  Final   Organism ID, Bacteria SERRATIA MARCESCENS  Final      Susceptibility   Serratia marcescens - MIC*    CEFAZOLIN >=64 RESISTANT Resistant  CEFEPIME <=1 SENSITIVE Sensitive     CEFTAZIDIME <=1 SENSITIVE Sensitive     CEFTRIAXONE <=1 SENSITIVE Sensitive     CIPROFLOXACIN <=0.25 SENSITIVE Sensitive     GENTAMICIN <=1 SENSITIVE Sensitive     TRIMETH/SULFA <=20 SENSITIVE Sensitive     * MODERATE SERRATIA MARCESCENS  C difficile quick scan w PCR reflex     Status: Abnormal   Collection Time: 01/26/17  1:59 PM  Result Value Ref Range Status   C Diff antigen POSITIVE (A) NEGATIVE Final   C Diff toxin NEGATIVE NEGATIVE Final   C Diff interpretation Results are indeterminate. See PCR results.  Final  Clostridium Difficile by PCR     Status: Abnormal   Collection Time: 01/26/17  1:59 PM  Result Value Ref Range Status   Toxigenic C Difficile by pcr POSITIVE (A) NEGATIVE Final    Comment: Positive for toxigenic C. difficile with little to no toxin production. Only treat if clinical presentation suggests symptomatic  illness.     Treated with Cefazolin, organism resistant to prescribed antimicrobial  Patient transferred to [facility] hospital on 9/29.  Called [facility] and patient is still admitted, room 3315. Paged MD Rochele Pages [phone].  Dr. Kennon Rounds is aware of Serratia Marcescens in wound cx from 01/26/17. MD thinks this is contaminant of open wound.    Antonin Meininger A 02/01/2017, 1:30 PM

## 2017-03-01 ENCOUNTER — Emergency Department: Payer: Medicare Other

## 2017-03-01 ENCOUNTER — Encounter: Payer: Self-pay | Admitting: Emergency Medicine

## 2017-03-01 ENCOUNTER — Emergency Department
Admission: EM | Admit: 2017-03-01 | Discharge: 2017-03-02 | Payer: Medicare Other | Attending: Emergency Medicine | Admitting: Emergency Medicine

## 2017-03-01 DIAGNOSIS — F17210 Nicotine dependence, cigarettes, uncomplicated: Secondary | ICD-10-CM | POA: Diagnosis not present

## 2017-03-01 DIAGNOSIS — E119 Type 2 diabetes mellitus without complications: Secondary | ICD-10-CM | POA: Insufficient documentation

## 2017-03-01 DIAGNOSIS — Z9861 Coronary angioplasty status: Secondary | ICD-10-CM | POA: Insufficient documentation

## 2017-03-01 DIAGNOSIS — R509 Fever, unspecified: Secondary | ICD-10-CM | POA: Diagnosis present

## 2017-03-01 DIAGNOSIS — I509 Heart failure, unspecified: Secondary | ICD-10-CM | POA: Diagnosis not present

## 2017-03-01 DIAGNOSIS — I2510 Atherosclerotic heart disease of native coronary artery without angina pectoris: Secondary | ICD-10-CM | POA: Insufficient documentation

## 2017-03-01 DIAGNOSIS — R05 Cough: Secondary | ICD-10-CM | POA: Insufficient documentation

## 2017-03-01 DIAGNOSIS — I110 Hypertensive heart disease with heart failure: Secondary | ICD-10-CM | POA: Diagnosis not present

## 2017-03-01 DIAGNOSIS — Z7982 Long term (current) use of aspirin: Secondary | ICD-10-CM | POA: Diagnosis not present

## 2017-03-01 DIAGNOSIS — L02213 Cutaneous abscess of chest wall: Secondary | ICD-10-CM | POA: Diagnosis not present

## 2017-03-01 DIAGNOSIS — Z91040 Latex allergy status: Secondary | ICD-10-CM | POA: Diagnosis not present

## 2017-03-01 DIAGNOSIS — J189 Pneumonia, unspecified organism: Secondary | ICD-10-CM | POA: Insufficient documentation

## 2017-03-01 DIAGNOSIS — Z79899 Other long term (current) drug therapy: Secondary | ICD-10-CM | POA: Diagnosis not present

## 2017-03-01 DIAGNOSIS — A419 Sepsis, unspecified organism: Secondary | ICD-10-CM

## 2017-03-01 LAB — CBC WITH DIFFERENTIAL/PLATELET
BASOS ABS: 0.1 10*3/uL (ref 0–0.1)
Basophils Relative: 1 %
EOS ABS: 0 10*3/uL (ref 0–0.7)
EOS PCT: 0 %
HCT: 29.2 % — ABNORMAL LOW (ref 35.0–47.0)
Hemoglobin: 9.2 g/dL — ABNORMAL LOW (ref 12.0–16.0)
LYMPHS PCT: 14 %
Lymphs Abs: 1.1 10*3/uL (ref 1.0–3.6)
MCH: 22 pg — ABNORMAL LOW (ref 26.0–34.0)
MCHC: 31.4 g/dL — ABNORMAL LOW (ref 32.0–36.0)
MCV: 70 fL — AB (ref 80.0–100.0)
MONO ABS: 0.4 10*3/uL (ref 0.2–0.9)
Monocytes Relative: 5 %
Neutro Abs: 6.1 10*3/uL (ref 1.4–6.5)
Neutrophils Relative %: 80 %
PLATELETS: 137 10*3/uL — AB (ref 150–440)
RBC: 4.17 MIL/uL (ref 3.80–5.20)
RDW: 19.7 % — AB (ref 11.5–14.5)
WBC: 7.6 10*3/uL (ref 3.6–11.0)

## 2017-03-01 LAB — URINALYSIS, COMPLETE (UACMP) WITH MICROSCOPIC
BACTERIA UA: NONE SEEN
Bilirubin Urine: NEGATIVE
GLUCOSE, UA: NEGATIVE mg/dL
Hgb urine dipstick: NEGATIVE
KETONES UR: NEGATIVE mg/dL
LEUKOCYTES UA: NEGATIVE
Nitrite: NEGATIVE
PROTEIN: 100 mg/dL — AB
Specific Gravity, Urine: 1.014 (ref 1.005–1.030)
pH: 6 (ref 5.0–8.0)

## 2017-03-01 LAB — COMPREHENSIVE METABOLIC PANEL
ALBUMIN: 2.3 g/dL — AB (ref 3.5–5.0)
ALT: 23 U/L (ref 14–54)
ANION GAP: 14 (ref 5–15)
AST: 51 U/L — AB (ref 15–41)
Alkaline Phosphatase: 112 U/L (ref 38–126)
BILIRUBIN TOTAL: 1.7 mg/dL — AB (ref 0.3–1.2)
BUN: 7 mg/dL (ref 6–20)
CHLORIDE: 88 mmol/L — AB (ref 101–111)
CO2: 29 mmol/L (ref 22–32)
Calcium: 8.9 mg/dL (ref 8.9–10.3)
Creatinine, Ser: 0.61 mg/dL (ref 0.44–1.00)
GFR calc Af Amer: >60 mL/min (ref >60)
GFR calc non Af Amer: >60 mL/min (ref >60)
GLUCOSE: 107 mg/dL — AB (ref 65–99)
POTASSIUM: 2 mmol/L — AB (ref 3.5–5.1)
SODIUM: 131 mmol/L — AB (ref 135–145)
TOTAL PROTEIN: 6.5 g/dL (ref 6.5–8.1)

## 2017-03-01 LAB — AMMONIA: Ammonia: 13 umol/L (ref 9–35)

## 2017-03-01 LAB — LACTIC ACID, PLASMA: LACTIC ACID, VENOUS: 1.7 mmol/L (ref 0.5–1.9)

## 2017-03-01 MED ORDER — SODIUM CHLORIDE 0.9 % IV BOLUS (SEPSIS)
500.0000 mL | Freq: Once | INTRAVENOUS | Status: AC
  Administered 2017-03-01: 500 mL via INTRAVENOUS

## 2017-03-01 MED ORDER — VANCOMYCIN HCL IN DEXTROSE 1-5 GM/200ML-% IV SOLN
1000.0000 mg | Freq: Once | INTRAVENOUS | Status: AC
  Administered 2017-03-01: 1000 mg via INTRAVENOUS
  Filled 2017-03-01: qty 200

## 2017-03-01 MED ORDER — MORPHINE SULFATE (PF) 4 MG/ML IV SOLN
4.0000 mg | Freq: Once | INTRAVENOUS | Status: AC
  Administered 2017-03-01: 4 mg via INTRAVENOUS
  Filled 2017-03-01: qty 1

## 2017-03-01 MED ORDER — PIPERACILLIN-TAZOBACTAM 3.375 G IVPB 30 MIN
3.3750 g | Freq: Once | INTRAVENOUS | Status: AC
  Administered 2017-03-01: 3.375 g via INTRAVENOUS
  Filled 2017-03-01: qty 50

## 2017-03-01 MED ORDER — IOPAMIDOL (ISOVUE-300) INJECTION 61%
75.0000 mL | Freq: Once | INTRAVENOUS | Status: AC | PRN
  Administered 2017-03-01: 75 mL via INTRAVENOUS

## 2017-03-01 MED ORDER — ONDANSETRON HCL 4 MG/2ML IJ SOLN
4.0000 mg | Freq: Once | INTRAMUSCULAR | Status: AC
  Administered 2017-03-01: 4 mg via INTRAVENOUS
  Filled 2017-03-01: qty 2

## 2017-03-01 MED ORDER — POTASSIUM CHLORIDE 10 MEQ/100ML IV SOLN
10.0000 meq | Freq: Once | INTRAVENOUS | Status: AC
  Administered 2017-03-01: 10 meq via INTRAVENOUS
  Filled 2017-03-01: qty 100

## 2017-03-01 NOTE — Progress Notes (Signed)
CODE SEPSIS - PHARMACY COMMUNICATION  **Broad Spectrum Antibiotics should be administered within 1 hour of Sepsis diagnosis**  Time Code Sepsis Called/Page Received: 19:31   Antibiotics Ordered: Zosyn and vancomycin  Time of 1st antibiotic administration: 19:39  Additional action taken by pharmacy: Spoke with RN Nettie ElmSylvia to make sure she was able to remove antibiotics from refrigerator - she said yes, and she was getting ready to adminster now.  If necessary, Name of Provider/Nurse Contacted: RN Hulen SkainsSylvia   Lamon Rotundo A Chelsae Zanella ,PharmD, BCPS Clinical Pharmacist  03/01/2017  7:44 PM

## 2017-03-01 NOTE — ED Notes (Signed)
No beds at [facility] tonight until after 11am 03/02/18.  Dr Lamont Snowballrifenbark aware.  Pt aware.

## 2017-03-01 NOTE — ED Notes (Signed)
Pt has a wound vac to the sternum.  Pt had cabg 4 months ago at [facility].  Pt continues to have infections to chest.  Pt has chest pain today.  Son states fever and and confusion for 1 week.  Pt worse today.

## 2017-03-01 NOTE — ED Provider Notes (Signed)
[facility] Emergency Department Provider Note  ____________________________________________  Time seen: Approximately 6:38 PM  I have reviewed the triage vital signs and the nursing notes.   HISTORY  Chief Complaint Fever and Confusion   HPI Meghan Jimenez is a 56 y.o. 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 Patient has been readmitted to [facility] several times since her CABG for mediastinitis. Was on abx until 10/4, pICC has now been removed.  Saw her surgeon one week ago complaining of fever and CP, found to have K 1.8 and sent home on PO K and no abx. Patient had not had the chest wound vacuumfor 1 week at home. Vacuum replaced yesterday by home health nurse, not draining anything. Has fever everyday at home since being discharged on 02/06/2017 as high as 102F. Has been on tylenol/ motrin around the clock. Has a cough productive of gray sputum, has severe constant pleuritic CP, has nausea, has dysuria. No vomiting or diarrhea.   Past Medical History:  Diagnosis Date  . CAD (coronary artery disease)   . CHF (congestive heart failure) (HCC)   . Cirrhosis (HCC)   . COPD (chronic obstructive pulmonary disease) (HCC)   . Diabetes mellitus without complication (HCC)   . Hepatitis C   . Hypertension     Patient Active Problem List   Diagnosis Date Noted  . Chest pain 11/12/2016  . PTSD (post-traumatic stress disorder) 12/13/2015  . Dysthymia 12/13/2015  . GIB (gastrointestinal bleeding) 12/10/2015  . Unstable angina (HCC) 11/11/2015    Past Surgical History:  Procedure Laterality Date  . BACK SURGERY    . CARDIAC SURGERY    . ESOPHAGOGASTRODUODENOSCOPY (EGD) WITH PROPOFOL N/A 12/11/2015   Procedure: ESOPHAGOGASTRODUODENOSCOPY (EGD) WITH PROPOFOL;  Surgeon: [name], MD;  Location: [facility] ENDOSCOPY;  Service: Gastroenterology;   Laterality: N/A;  . LEFT HEART CATH AND CORONARY ANGIOGRAPHY N/A 11/13/2016   Procedure: Left Heart Cath and Coronary Angiography;  Surgeon: [name], MD;  Location: [facility] INVASIVE CV LAB;  Service: Cardiovascular;  Laterality: N/A;    Prior to Admission medications   Medication Sig Start Date End Date Taking? Authorizing Provider  aspirin EC 81 MG tablet Take 81 mg by mouth daily.    [provider]  atorvastatin (LIPITOR) 40 MG tablet Take 40 mg by mouth daily. 11/27/16   [provider]  ceFAZolin (ANCEF) 1 g injection 2 g by Other route every 8 (eight) hours.  01/23/17   [provider]  diphenhydrAMINE (BENADRYL) 50 MG capsule Take 50 mg by mouth every 6 (six) hours as needed.    [provider]  gabapentin (NEURONTIN) 300 MG capsule Take 1 capsule (300 mg total) by mouth 3 (three) times daily. 12/14/15   Auburn Bilberry, MD  hydrOXYzine (ATARAX/VISTARIL) 10 MG tablet Take 1 tablet (10 mg total) by mouth 3 (three) times daily as needed. 12/14/15   Auburn Bilberry, MD  metoprolol tartrate (LOPRESSOR) 25 MG tablet Take 25 mg by mouth 2 (two) times daily. 11/27/16   [provider]  oxyCODONE (OXY IR/ROXICODONE) 5 MG immediate release tablet Take 1 tablet by mouth 4 (four) times daily as needed. 11/27/16   [provider]  potassium chloride SA (K-DUR,KLOR-CON) 20 MEQ tablet Take 1 tablet by mouth 2 (two) times daily. 11/27/16   [provider]  torsemide (DEMADEX) 20 MG tablet Take 3 tablets  by mouth 2 (two) times daily. 01/12/17   [provider]  traZODone (DESYREL) 100 MG tablet Take 1 tablet (100 mg total) by mouth at bedtime. 12/14/15   Auburn Bilberry, MD    Allergies Aspirin; Tramadol; Acetaminophen; Latex; Codeine; and Vicodin [hydrocodone-acetaminophen]  Family History  Problem Relation Age of Onset  . CAD Mother   . Diabetes Mother   . Lung cancer Father     Social History Social History  Substance Use  Topics  . Smoking status: Current Every Day Smoker    Packs/day: 1.00    Types: Cigarettes  . Smokeless tobacco: Never Used     Comment: SMOKES 3-4 PKS/DAY  . Alcohol use Yes     Comment: Drinks occasionally every week    Review of Systems  Constitutional: + fever. Eyes: Negative for visual changes. ENT: Negative for sore throat. Neck: No neck pain  Cardiovascular: + chest pain. Respiratory: Negative for shortness of breath. + cough Gastrointestinal: Negative for abdominal pain, vomiting or diarrhea. Genitourinary: + dysuria. Musculoskeletal: Negative for back pain. Skin: Negative for rash. Neurological: Negative for headaches, weakness or numbness. Psych: No SI or HI  ____________________________________________   PHYSICAL EXAM:  VITAL SIGNS: ED Triage Vitals  Enc Vitals Group     BP 03/01/17 1756 125/72     Pulse Rate 03/01/17 1756 (!) 106     Resp 03/01/17 1756 18     Temp 03/01/17 1756 99.2 F (37.3 C)     Temp Source 03/01/17 1756 Oral     SpO2 03/01/17 1756 96 %     Weight 03/01/17 1800 195 lb (88.5 kg)     Height 03/01/17 1800 5\' 4"  (1.626 m)     Head Circumference --      Peak Flow --      Pain Score 03/01/17 1800 10     Pain Loc --      Pain Edu? --      Excl. in GC? --     Constitutional: Alert and oriented, jaundiced, no distress.  HEENT:      Head: Normocephalic and atraumatic.         Eyes: Conjunctivae are normal. Sclera is non-icteric.       Mouth/Throat: Mucous membranes are moist.       Neck: Supple with no signs of meningismus. Cardiovascular: Tachycardic with regular rhythm, there is a small of erythema over the most superior part of the incison, wound vac in place with no drainage, patient ttp over the entire mid chest, no crepitus Respiratory: Normal respiratory effort. Lungs are clear to auscultation bilaterally. No wheezes, crackles, or rhonchi.  Gastrointestinal: Soft, non tender, and non distended with positive bowel sounds. No  rebound or guarding. Musculoskeletal: Nontender with normal range of motion in all extremities. No edema, cyanosis, or erythema of extremities. Neurologic: Normal speech and language. Face is symmetric. Moving all extremities. No gross focal neurologic deficits are appreciated. Skin: Skin is warm, dry and intact. No rash noted. Jaundiced   ____________________________________________   LABS (all labs ordered are listed, but only abnormal results are displayed)  Labs Reviewed  CBC WITH DIFFERENTIAL/PLATELET - Abnormal; Notable for the following:       Result Value   Hemoglobin 9.2 (*)    HCT 29.2 (*)    MCV 70.0 (*)    MCH 22.0 (*)    MCHC 31.4 (*)    RDW 19.7 (*)    Platelets 137 (*)    All other components within  normal limits  URINALYSIS, COMPLETE (UACMP) WITH MICROSCOPIC - Abnormal; Notable for the following:    Color, Urine AMBER (*)    APPearance CLEAR (*)    Protein, ur 100 (*)    Squamous Epithelial / LPF 0-5 (*)    All other components within normal limits  CULTURE, BLOOD (ROUTINE X 2)  CULTURE, BLOOD (ROUTINE X 2)  LACTIC ACID, PLASMA  AMMONIA  LACTIC ACID, PLASMA  COMPREHENSIVE METABOLIC PANEL   ____________________________________________  EKG  ED ECG REPORT I, Nita Sickle, the attending physician, personally viewed and interpreted this ECG.  Poor quality EKG due to patient's motion, sinus tachycardia, rate of 12, prolonged QTC, right axis deviation, no ST elevations or depressions.  ____________________________________________  RADIOLOGY  CXR: 1. Bilateral areas of subsegmental atelectasis. 2. Suspect right middle lobe infiltrate. Followup PA and lateral chest X-ray is recommended in 3-4 weeks following trial of antibiotic therapy to ensure resolution and exclude underlying malignancy. ____________________________________________   PROCEDURES  Procedure(s) performed: None Procedures Critical Care performed: yes  CRITICAL CARE Performed  by: Nita Sickle  ?  Total critical care time: 35 min  Critical care time was exclusive of separately billable procedures and treating other patients.  Critical care was necessary to treat or prevent imminent or life-threatening deterioration.  Critical care was time spent personally by me on the following activities: development of treatment plan with patient and/or surrogate as well as nursing, discussions with consultants, evaluation of patient's response to treatment, examination of patient, obtaining history from patient or surrogate, ordering and performing treatments and interventions, ordering and review of laboratory studies, ordering and review of radiographic studies, pulse oximetry and re-evaluation of patient's condition.  ____________________________________________   INITIAL IMPRESSION / ASSESSMENT AND PLAN / ED COURSE  56 y.o. 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 patient is tachycardic, low-grade fever, looks jaundiced, wound VAC with no active drainage, she is tender to palpation over the chest wall with small amount of erythema on the superior end of patient's surgical incision. also complaining of dysuria. Presentation concerning for sepsis from possible pneumonia, mediastinitis, UTI. We'll do sepsis workup, start patient on vancomycin and Zosyn. Anticipate admission and/or transfer to [facility]  Clinical Course as of Mar 02 2015  Thu Mar 01, 2017  2012 CBC and lactic acid normal. CXR concerning for PNA. Due to multiple recent admission, patient treated for HCAP for which patient was given zosyn and vancomycin. She remains HD stable. Awaiting CT chest to rule out mediastinitis. If CT negative, plan to admit here for abx for HCAP. If positive for post op infection or complication patient will need to be transferred to [facility]. Care transferred  to Dr. Marisa Severin.  [CV]    Clinical Course User Index [CV] Don Perking [place], MD     As part of my medical decision making, I reviewed the following data within the electronic MEDICAL RECORD NUMBER History obtained from family, Nursing notes reviewed and incorporated, Labs reviewed , EKG interpreted , Old EKG reviewed, Old chart reviewed, Patient signed out to Dr. Marisa Severin, Radiograph reviewed , Notes from prior ED visits and  Controlled Substance Database    Pertinent labs & imaging results that were available during my care of the patient were reviewed by me and considered in my medical decision making (see chart for details).    ____________________________________________   FINAL CLINICAL IMPRESSION(S) / ED DIAGNOSES  Final diagnoses:  Sepsis, due to unspecified organism (HCC)  HCAP (healthcare-associated pneumonia)      NEW MEDICATIONS STARTED DURING THIS VISIT:  New Prescriptions   No medications on file     Note:  This document was prepared using Dragon voice recognition software and may include unintentional dictation errors.    Nita SickleVeronese, [place], MD 03/01/17 2016

## 2017-03-01 NOTE — ED Notes (Signed)
RN spoke with lab tech, needed a new CMP RN informed Lab Tech , pt is difficult stick unable to find other site to get blood work, Designer, industrial/productLab tech is sending someone from lab to get blood work.

## 2017-03-01 NOTE — ED Notes (Signed)
Patient transported to CT 

## 2017-03-01 NOTE — ED Notes (Signed)
ED Provider at bedside. 

## 2017-03-01 NOTE — ED Triage Notes (Signed)
Pt here with son who reports pt has been running a fever for one week and has alternating bouts of confusion and lucidity. Pt had open heart surgery 4-5 months with history of MRSA and c. Diff. Pt not currently being treated for either. Pt has wound vac attached to bottom of sternal incsion. Pt son reports tmax 102. Pt alert and oriented to self and place in triage, moaning.

## 2017-03-01 NOTE — ED Notes (Signed)
Patient transported to X-ray 

## 2017-03-02 ENCOUNTER — Ambulatory Visit (HOSPITAL_COMMUNITY)
Admission: AD | Admit: 2017-03-02 | Discharge: 2017-03-02 | Disposition: A | Discharge status: Home / Self Care | Payer: Medicare Other | Source: Other Acute Inpatient Hospital | Attending: Emergency Medicine | Admitting: Emergency Medicine

## 2017-03-02 DIAGNOSIS — Y839 Surgical procedure, unspecified as the cause of abnormal reaction of the patient, or of later complication, without mention of misadventure at the time of the procedure: Secondary | ICD-10-CM | POA: Insufficient documentation

## 2017-03-02 DIAGNOSIS — T819XXA Unspecified complication of procedure, initial encounter: Secondary | ICD-10-CM | POA: Diagnosis present

## 2017-03-02 NOTE — ED Notes (Signed)
Report called to andrea rn [facility].

## 2017-03-02 NOTE — ED Notes (Signed)
Pt placed on 2 liters oxygen.  Sats dropped in the 80's.   nsr on monitor.

## 2017-03-02 NOTE — ED Notes (Signed)
EMTALA checked for completion  

## 2017-03-02 NOTE — ED Notes (Signed)
[facility] here for transport.  Pt alert.  Iv in place.  Skin warm and dry.  nsr on monitor. Pt on 2 liters oxygen.  Pt signed consent for transfer to [facility].  No family with pt at this time.

## 2017-03-02 NOTE — ED Notes (Signed)
Pt sleeping.  nsr on monitor.  

## 2017-03-06 LAB — CULTURE, BLOOD (ROUTINE X 2)
CULTURE: NO GROWTH
CULTURE: NO GROWTH

## 2017-03-20 DIAGNOSIS — E43 Unspecified severe protein-calorie malnutrition: Secondary | ICD-10-CM | POA: Insufficient documentation

## 2017-05-03 DIAGNOSIS — Z8619 Personal history of other infectious and parasitic diseases: Secondary | ICD-10-CM | POA: Insufficient documentation

## 2017-06-29 DIAGNOSIS — E871 Hypo-osmolality and hyponatremia: Secondary | ICD-10-CM | POA: Insufficient documentation

## 2017-06-29 DIAGNOSIS — K089 Disorder of teeth and supporting structures, unspecified: Secondary | ICD-10-CM | POA: Insufficient documentation

## 2017-09-04 ENCOUNTER — Ambulatory Visit: Payer: Medicare Other | Admitting: Nurse Practitioner

## 2017-09-18 ENCOUNTER — Ambulatory Visit: Payer: Medicare Other | Admitting: Nurse Practitioner

## 2017-10-04 ENCOUNTER — Ambulatory Visit: Payer: Medicare Other | Admitting: Nurse Practitioner

## 2017-11-12 ENCOUNTER — Other Ambulatory Visit: Payer: Self-pay | Admitting: Obstetrics and Gynecology

## 2017-11-12 DIAGNOSIS — Z1231 Encounter for screening mammogram for malignant neoplasm of breast: Secondary | ICD-10-CM

## 2017-12-21 ENCOUNTER — Emergency Department: Payer: Medicare Other

## 2017-12-21 ENCOUNTER — Other Ambulatory Visit: Payer: Self-pay

## 2017-12-21 DIAGNOSIS — Y829 Unspecified medical devices associated with adverse incidents: Secondary | ICD-10-CM | POA: Diagnosis not present

## 2017-12-21 DIAGNOSIS — Z79899 Other long term (current) drug therapy: Secondary | ICD-10-CM | POA: Insufficient documentation

## 2017-12-21 DIAGNOSIS — E119 Type 2 diabetes mellitus without complications: Secondary | ICD-10-CM | POA: Insufficient documentation

## 2017-12-21 DIAGNOSIS — T8131XA Disruption of external operation (surgical) wound, not elsewhere classified, initial encounter: Secondary | ICD-10-CM | POA: Insufficient documentation

## 2017-12-21 DIAGNOSIS — I2510 Atherosclerotic heart disease of native coronary artery without angina pectoris: Secondary | ICD-10-CM | POA: Diagnosis not present

## 2017-12-21 DIAGNOSIS — Z91040 Latex allergy status: Secondary | ICD-10-CM | POA: Insufficient documentation

## 2017-12-21 DIAGNOSIS — I110 Hypertensive heart disease with heart failure: Secondary | ICD-10-CM | POA: Diagnosis not present

## 2017-12-21 DIAGNOSIS — R0789 Other chest pain: Secondary | ICD-10-CM | POA: Diagnosis not present

## 2017-12-21 DIAGNOSIS — J449 Chronic obstructive pulmonary disease, unspecified: Secondary | ICD-10-CM | POA: Diagnosis not present

## 2017-12-21 DIAGNOSIS — Z7982 Long term (current) use of aspirin: Secondary | ICD-10-CM | POA: Insufficient documentation

## 2017-12-21 DIAGNOSIS — F17210 Nicotine dependence, cigarettes, uncomplicated: Secondary | ICD-10-CM | POA: Insufficient documentation

## 2017-12-21 DIAGNOSIS — I509 Heart failure, unspecified: Secondary | ICD-10-CM | POA: Insufficient documentation

## 2017-12-21 DIAGNOSIS — R079 Chest pain, unspecified: Secondary | ICD-10-CM | POA: Diagnosis present

## 2017-12-21 LAB — BASIC METABOLIC PANEL
Anion gap: 7 (ref 5–15)
BUN: 16 mg/dL (ref 6–20)
CHLORIDE: 103 mmol/L (ref 98–111)
CO2: 24 mmol/L (ref 22–32)
Calcium: 8.6 mg/dL — ABNORMAL LOW (ref 8.9–10.3)
Creatinine, Ser: 0.64 mg/dL (ref 0.44–1.00)
GFR calc Af Amer: >60 mL/min (ref >60)
GFR calc non Af Amer: >60 mL/min (ref >60)
GLUCOSE: 76 mg/dL (ref 70–99)
Potassium: 3.7 mmol/L (ref 3.5–5.1)
SODIUM: 134 mmol/L — AB (ref 135–145)

## 2017-12-21 LAB — TROPONIN I: Troponin I: <0.03 ng/mL (ref <0.03)

## 2017-12-21 NOTE — ED Triage Notes (Signed)
Pt arrives to ED via POV from home with c/o chest pain, shortness of breath, nausea, and dizziness x2 weeks. Pt reports heart surgery "a few months ago" with "several" stent placement. Pt states she has "bones coming out of my body" and points to the bandage on her chest from previous surgery.

## 2017-12-22 ENCOUNTER — Emergency Department: Payer: Medicare Other

## 2017-12-22 ENCOUNTER — Emergency Department
Admission: EM | Admit: 2017-12-22 | Discharge: 2017-12-22 | Disposition: A | Discharge status: Home / Self Care | Payer: Medicare Other | Attending: Emergency Medicine | Admitting: Emergency Medicine

## 2017-12-22 DIAGNOSIS — R52 Pain, unspecified: Secondary | ICD-10-CM

## 2017-12-22 DIAGNOSIS — R0789 Other chest pain: Secondary | ICD-10-CM

## 2017-12-22 DIAGNOSIS — T148XXA Other injury of unspecified body region, initial encounter: Secondary | ICD-10-CM

## 2017-12-22 LAB — CBC
HCT: 26.5 % — ABNORMAL LOW (ref 35.0–47.0)
Hemoglobin: 8.2 g/dL — ABNORMAL LOW (ref 12.0–16.0)
MCH: 16.9 pg — ABNORMAL LOW (ref 26.0–34.0)
MCHC: 31.1 g/dL — AB (ref 32.0–36.0)
MCV: 54.2 fL — AB (ref 80.0–100.0)
Platelets: 100 10*3/uL — ABNORMAL LOW (ref 150–440)
RBC: 4.89 MIL/uL (ref 3.80–5.20)
RDW: 20.3 % — AB (ref 11.5–14.5)
WBC: 6 10*3/uL (ref 3.6–11.0)

## 2017-12-22 LAB — TROPONIN I

## 2017-12-22 MED ORDER — OXYCODONE HCL 5 MG PO TABS: 5.0000 mg | ORAL_TABLET | Freq: Three times a day (TID) | ORAL | 0 refills | Status: AC | PRN

## 2017-12-22 MED ORDER — OXYCODONE HCL 5 MG PO TABS
10.0000 mg | ORAL_TABLET | Freq: Once | ORAL | Status: AC
Start: 2017-12-22 — End: 2017-12-22
  Administered 2017-12-22: 10 mg via ORAL
  Filled 2017-12-22: qty 2

## 2017-12-22 MED ORDER — IOPAMIDOL (ISOVUE-300) INJECTION 61%
100.0000 mL | Freq: Once | INTRAVENOUS | Status: AC | PRN
  Administered 2017-12-22: 100 mL via INTRAVENOUS

## 2017-12-22 NOTE — Discharge Instructions (Addendum)
Please follow up with your cardiothoracic surgeon for further evaluation of your chest pain.

## 2017-12-22 NOTE — ED Provider Notes (Signed)
[facility] Emergency Department Provider Note   ____________________________________________   First MD Initiated Contact with Patient 12/22/17 [phone]     (approximate)  I have reviewed the triage vital signs and the nursing notes.   HISTORY  Chief Complaint Chest Pain; Dizziness; and Shortness of Breath    HPI Meghan Jimenez is a 57 y.o. female who comes into the hospital today with some chest pain status post CABG with mediastinitis as a complication.  She states that she was released from the hospital last month and she is tired of hurting.  She reports that Dr. Juliann Pares is her cardiologist.  She has some drainage from 1 of her mediastinal wound which she has had for some time.  She was told by the surgeon of the drainage with green then it was an infection but if not and is not infection.  She is been taking Tylenol for pain but it has not been helping.  The patient rates her pain a 10 out of 10 in intensity.  She saw her surgeon in the end of July.  She feels like her heart is also having pounding and palpitations.  She feels like it is drawn up in her chest.  She is here today for evaluation.  Past Medical History:  Diagnosis Date  . CAD (coronary artery disease)   . CHF (congestive heart failure) (HCC)   . Cirrhosis (HCC)   . COPD (chronic obstructive pulmonary disease) (HCC)   . Diabetes mellitus without complication (HCC)   . Hepatitis C   . Hypertension     Patient Active Problem List   Diagnosis Date Noted  . Chest pain 11/12/2016  . PTSD (post-traumatic stress disorder) 12/13/2015  . Dysthymia 12/13/2015  . GIB (gastrointestinal bleeding) 12/10/2015  . Unstable angina (HCC) 11/11/2015    Past Surgical History:  Procedure Laterality Date  . BACK SURGERY    . CARDIAC SURGERY    . ESOPHAGOGASTRODUODENOSCOPY (EGD) WITH PROPOFOL N/A 12/11/2015   Procedure: ESOPHAGOGASTRODUODENOSCOPY (EGD) WITH PROPOFOL;  Surgeon: [name], MD;   Location: [facility] ENDOSCOPY;  Service: Gastroenterology;  Laterality: N/A;  . LEFT HEART CATH AND CORONARY ANGIOGRAPHY N/A 11/13/2016   Procedure: Left Heart Cath and Coronary Angiography;  Surgeon: [name], MD;  Location: [facility] INVASIVE CV LAB;  Service: Cardiovascular;  Laterality: N/A;    Prior to Admission medications   Medication Sig Start Date End Date Taking? Authorizing Provider  aspirin EC 81 MG tablet Take 81 mg by mouth daily.    [provider]  atorvastatin (LIPITOR) 40 MG tablet Take 40 mg by mouth daily. 11/27/16   [provider]  ceFAZolin (ANCEF) 1 g injection 2 g by Other route every 8 (eight) hours.  01/23/17   [provider]  diphenhydrAMINE (BENADRYL) 50 MG capsule Take 50 mg by mouth every 6 (six) hours as needed.    [provider]  gabapentin (NEURONTIN) 300 MG capsule Take 1 capsule (300 mg total) by mouth 3 (three) times daily. Patient not taking: Reported on 03/01/2017 12/14/15   Auburn Bilberry, MD  hydrOXYzine (ATARAX/VISTARIL) 10 MG tablet Take 1 tablet (10 mg total) by mouth 3 (three) times daily as needed. Patient not taking: Reported on 03/01/2017 12/14/15   Auburn Bilberry, MD  metoprolol succinate (TOPROL-XL) 25 MG 24 hr tablet Take 12.5 mg by mouth daily.    [provider]  Multiple Vitamin (MULTIVITAMIN WITH MINERALS) TABS tablet Take 1 tablet by mouth daily.  [provider]  oxyCODONE (OXY IR/ROXICODONE) 5 MG immediate release tablet Take 1 tablet by mouth 4 (four) times daily as needed. 11/27/16   [provider]  oxyCODONE (ROXICODONE) 5 MG immediate release tablet Take 1 tablet (5 mg total) by mouth every 8 (eight) hours as needed. 12/22/17 12/22/18  [name], MD  pantoprazole (PROTONIX) 40 MG tablet Take 40 mg by mouth 2 (two) times daily before a meal.    [provider]  potassium chloride SA (KLOR-CON M20) 20 MEQ tablet Take 40 mEq by mouth daily.    [provider]  spironolactone (ALDACTONE) 25 MG tablet Take 50 mg by mouth daily.     [provider]  traZODone (DESYREL) 100 MG tablet Take 1 tablet (100 mg total) by mouth at bedtime. 12/14/15   Auburn Bilberry, MD    Allergies Aspirin; Tramadol; Acetaminophen; Latex; Codeine; and Vicodin [hydrocodone-acetaminophen]  Family History  Problem Relation Age of Onset  . CAD Mother   . Diabetes Mother   . Lung cancer Father     Social History Social History   Tobacco Use  . Smoking status: Current Every Day Smoker    Packs/day: 1.00    Types: Cigarettes  . Smokeless tobacco: Never Used  . Tobacco comment: SMOKES 3-4 PKS/DAY  Substance Use Topics  . Alcohol use: Yes    Comment: Drinks occasionally every week  . Drug use: Yes    Types: "Crack" cocaine    Review of Systems  Constitutional: No fever/chills Eyes: No visual changes. ENT: No sore throat. Cardiovascular:  chest pain. Respiratory: Denies shortness of breath. Gastrointestinal: No abdominal pain.  No nausea, no vomiting.  No diarrhea.  No constipation. Genitourinary: Negative for dysuria. Musculoskeletal: Negative for back pain. Skin: Wound drainage Neurological: Negative for headaches, focal weakness or numbness.   ____________________________________________   PHYSICAL EXAM:  VITAL SIGNS: ED Triage Vitals  Enc Vitals Group     BP 12/21/17 2254 (!) 148/88     Pulse Rate 12/21/17 2254 92     Resp 12/21/17 2254 18     Temp 12/21/17 2254 98.1 F (36.7 C)     Temp Source 12/21/17 2254 Oral     SpO2 12/21/17 2254 98 %     Weight 12/21/17 2238 175 lb (79.4 kg)     Height 12/21/17 2238 5\' 4"  (1.626 m)     Head Circumference --      Peak Flow --      Pain Score 12/21/17 2238 9     Pain Loc --      Pain Edu? --      Excl. in GC? --     Constitutional: Alert and oriented. Well appearing and in moderate distress. Eyes: Conjunctivae are normal. PERRL. EOMI. Head: Atraumatic. Nose: No  congestion/rhinnorhea. Mouth/Throat: Mucous membranes are moist.  Oropharynx non-erythematous. Cardiovascular: Normal rate, regular rhythm. Grossly normal heart sounds.  Good peripheral circulation. Respiratory: Normal respiratory effort.  No retractions. Lungs CTAB. Gastrointestinal: Soft and nontender. No distention. Positive bowel sounds Musculoskeletal: No lower extremity tenderness nor edema.  No joint effusions. Neurologic:  Normal speech and language. No gross focal neurologic deficits are appreciated. No gait instability. Skin:  Skin is warm, dry and intact. Erythema and scarring to mid chest. There is an opening to the top of the wound and the bottom. There is some scant drainage from the top wound Psychiatric: Mood and affect are normal. Speech and behavior are normal.  ____________________________________________  LABS (all labs ordered are listed, but only abnormal results are displayed)  Labs Reviewed  BASIC METABOLIC PANEL - Abnormal; Notable for the following components:      Result Value   Sodium 134 (*)    Calcium 8.6 (*)    All other components within normal limits  CBC - Abnormal; Notable for the following components:   Hemoglobin 8.2 (*)    HCT 26.5 (*)    MCV 54.2 (*)    MCH 16.9 (*)    MCHC 31.1 (*)    RDW 20.3 (*)    Platelets 100 (*)    All other components within normal limits  TROPONIN I  TROPONIN I   ____________________________________________  EKG  ED ECG REPORT I, Rebecka ApleyWebster,  Deyci Gesell P, the attending physician, personally viewed and interpreted this ECG.   Date: 12/21/2017  EKG Time: 2230  Rate: 85  Rhythm: normal sinus rhythm  Axis: normal  Intervals:none  ST&T Change: none  ____________________________________________  RADIOLOGY  ED MD interpretation:  CXR: No active cardiopulmonary disease  CT chest with contrast: Right mainstem bronchus debris less likely mass, bronchoscopy should be considered, multifocal atelectasis/scarring  without focal consolidation, cirrhosis and portal hypertension no ascites in the upper liver, chronic changes anterior thorax no CT findings of active infection.  Official radiology report(s): Dg Chest 2 View  Result Date: 12/21/2017 CLINICAL DATA:  Chest pain EXAM: CHEST - 2 VIEW COMPARISON:  03/01/2017, CT chest 03/01/2017 FINDINGS: Postsurgical changes in the cervical spine. No acute airspace disease or pleural effusion. Cardiomediastinal silhouette within normal limits. No pneumothorax. Surgical changes over the anterior chest wall. IMPRESSION: No active cardiopulmonary disease. Electronically Signed   By: Jasmine PangKim  Fujinaga M.D.   On: 12/21/2017 23:50   Ct Chest W Contrast  Result Date: 12/22/2017 CLINICAL DATA:  Chest pain and shortness of breath, nausea and dizziness for 2 weeks. History of cardiac stent placement a few weeks ago, polysubstance abuse. EXAM: CT CHEST WITH CONTRAST TECHNIQUE: Multidetector CT imaging of the chest was performed during intravenous contrast administration. CONTRAST:  100mL ISOVUE-300 IOPAMIDOL (ISOVUE-300) INJECTION 61% COMPARISON:  Chest radiograph December 21, 2017 and CT chest March 01, 2017. FINDINGS: CARDIOVASCULAR: Heart size is normal. Mild coronary artery calcifications. No pericardial effusion. Thoracic aorta is normal in course and caliber. Mild calcific atherosclerosis. MEDIASTINUM/NODES: No mediastinal mass. No lymphadenopathy by CT size criteria. Normal appearance of thoracic esophagus though not tailored for evaluation. LUNGS/PLEURA: Tracheobronchial tree is patent, no pneumothorax. Nonobstructing soft tissue along the posterior wall RIGHT mainstem bronchus. Bilateral lower lobe, RIGHT middle lobe and LEFT upper lobe atelectasis/scarring. No pleural effusion or focal consolidation. Minimal RIGHT subpleural ground-glass opacities, potential atelectasis. UPPER ABDOMEN: Nodular liver with stable subcentimeter hypodensity LEFT lobe of the liver. Partially imaged  splenomegaly with splenorenal shunt. Rectus abdominus diastasis incompletely imaged. MUSCULOSKELETAL: Sternal resection with abdominal fat hernia through the defect. Severe anterior chest wall scarring. No destructive bony lesions. ACDF with C7-T1 adjacent segment disease in subsidence. Mild degenerative changes thoracic spine. IMPRESSION: 1. RIGHT mainstem bronchus debris, less likely mass. Bronchoscopy could be considered. 2. Multifocal atelectasis/scarring without focal consolidation. 3. Cirrhosis and portal hypertension, no ascites in upper abdomen. 4. Chronic changes anterior thorax, no CT findings of active infection. Aortic Atherosclerosis (ICD10-I70.0). Electronically Signed   By: Awilda Metroourtnay  Bloomer M.D.   On: 12/22/2017 04:27    ____________________________________________   PROCEDURES  Procedure(s) performed: None  Procedures  Critical Care performed: No  ____________________________________________   INITIAL IMPRESSION / ASSESSMENT AND PLAN /  ED COURSE  As part of my medical decision making, I reviewed the following data within the electronic MEDICAL RECORD NUMBER Notes from prior ED visits and [place] Controlled Substance Database   This is a 57 year old female who comes into the hospital today with some chest pain.  The patient is having pain along her scar where she also has some wound dehiscence from her previous sternotomy and mediastinitis.  I did give the patient a dose of the oxycodone for her pain and we did check some blood work.  The patient had a CBC BMP and 2 troponins.  The patient also had a CT of her chest looking for possible infection which was negative.  The patient's hemoglobin is 8.2 with a hematocrit of 26.5 but his previous hemoglobin was 9.2.  The patient does not have a white blood cell count.  I will discharge the patient to home to follow-up with her surgeon.  The patient has no further complaints or concerns or questions at this time.       ____________________________________________   FINAL CLINICAL IMPRESSION(S) / ED DIAGNOSES  Final diagnoses:  Chest wall pain  Pain associated with wound     ED Discharge Orders         Ordered    oxyCODONE (ROXICODONE) 5 MG immediate release tablet  Every 8 hours PRN     12/22/17 0609           Note:  This document was prepared using Dragon voice recognition software and may include unintentional dictation errors.    [name], MD 12/22/17 [phone]

## 2018-02-28 DIAGNOSIS — M9689 Other intraoperative and postprocedural complications and disorders of the musculoskeletal system: Secondary | ICD-10-CM | POA: Insufficient documentation

## 2018-06-19 ENCOUNTER — Other Ambulatory Visit: Payer: Self-pay | Admitting: Obstetrics and Gynecology

## 2018-06-19 DIAGNOSIS — Z1231 Encounter for screening mammogram for malignant neoplasm of breast: Secondary | ICD-10-CM

## 2018-06-19 DIAGNOSIS — Z1211 Encounter for screening for malignant neoplasm of colon: Secondary | ICD-10-CM

## 2018-12-24 ENCOUNTER — Other Ambulatory Visit: Payer: Self-pay

## 2018-12-24 ENCOUNTER — Emergency Department
Admission: EM | Admit: 2018-12-24 | Discharge: 2018-12-25 | Disposition: A | Discharge status: Home / Self Care | Payer: Medicare Other | Attending: Emergency Medicine | Admitting: Emergency Medicine

## 2018-12-24 DIAGNOSIS — X58XXXA Exposure to other specified factors, initial encounter: Secondary | ICD-10-CM | POA: Diagnosis not present

## 2018-12-24 DIAGNOSIS — J449 Chronic obstructive pulmonary disease, unspecified: Secondary | ICD-10-CM | POA: Insufficient documentation

## 2018-12-24 DIAGNOSIS — I509 Heart failure, unspecified: Secondary | ICD-10-CM | POA: Insufficient documentation

## 2018-12-24 DIAGNOSIS — S20319A Abrasion of unspecified front wall of thorax, initial encounter: Secondary | ICD-10-CM

## 2018-12-24 DIAGNOSIS — F17210 Nicotine dependence, cigarettes, uncomplicated: Secondary | ICD-10-CM | POA: Insufficient documentation

## 2018-12-24 DIAGNOSIS — Y999 Unspecified external cause status: Secondary | ICD-10-CM | POA: Insufficient documentation

## 2018-12-24 DIAGNOSIS — S20219A Contusion of unspecified front wall of thorax, initial encounter: Secondary | ICD-10-CM | POA: Insufficient documentation

## 2018-12-24 DIAGNOSIS — I10 Essential (primary) hypertension: Secondary | ICD-10-CM

## 2018-12-24 DIAGNOSIS — Y929 Unspecified place or not applicable: Secondary | ICD-10-CM | POA: Diagnosis not present

## 2018-12-24 DIAGNOSIS — Y939 Activity, unspecified: Secondary | ICD-10-CM | POA: Diagnosis not present

## 2018-12-24 DIAGNOSIS — Z79899 Other long term (current) drug therapy: Secondary | ICD-10-CM | POA: Insufficient documentation

## 2018-12-24 DIAGNOSIS — R51 Headache: Secondary | ICD-10-CM | POA: Diagnosis not present

## 2018-12-24 DIAGNOSIS — I2510 Atherosclerotic heart disease of native coronary artery without angina pectoris: Secondary | ICD-10-CM | POA: Insufficient documentation

## 2018-12-24 DIAGNOSIS — Z91040 Latex allergy status: Secondary | ICD-10-CM | POA: Diagnosis not present

## 2018-12-24 DIAGNOSIS — Z7982 Long term (current) use of aspirin: Secondary | ICD-10-CM | POA: Insufficient documentation

## 2018-12-24 DIAGNOSIS — R519 Headache, unspecified: Secondary | ICD-10-CM

## 2018-12-24 DIAGNOSIS — I110 Hypertensive heart disease with heart failure: Secondary | ICD-10-CM | POA: Diagnosis not present

## 2018-12-24 NOTE — ED Triage Notes (Signed)
Patient states that she had a systolic blood pressure of over 200 this morning. She also presents with a cough that she attributes to COPD.

## 2018-12-24 NOTE — ED Provider Notes (Signed)
[facility] Emergency Department Provider Note   ____________________________________________   First MD Initiated Contact with Patient 12/24/18 2356     (approximate)  I have reviewed the triage vital signs and the nursing notes.   HISTORY  Chief Complaint Shortness of Breath and Hypertension    HPI Meghan Jimenez is a 58 y.o. female who presents to the ED from home with a chief complaint of elevated blood pressure and cough.  Patient states she had a systolic blood pressure of over 200 yesterday.  Does not know what her baseline blood pressure is.  Also states she does not take blood pressure medicines.  She had an mild, global headache at the time she took her blood pressure.  Also complaining of dry cough and wheezing which she attributes to her COPD.  States her blood sugar was "high".  Also self treating a superficial abrasion on her sternum.  Denies fever, chest pain, shortness of breath, abdominal pain, nausea, vomiting or dizziness.  Denies recent travel, trauma or exposure to persons diagnosed with coronavirus.       Past Medical History:  Diagnosis Date  . CAD (coronary artery disease)   . CHF (congestive heart failure) ([place])   . Cirrhosis ([place])   . COPD (chronic obstructive pulmonary disease) ([place])   . Diabetes mellitus without complication ([place])   . Hepatitis C   . Hypertension     Patient Active Problem List   Diagnosis Date Noted  . Chest pain 11/12/2016  . PTSD (post-traumatic stress disorder) 12/13/2015  . Dysthymia 12/13/2015  . GIB (gastrointestinal bleeding) 12/10/2015  . Unstable angina ([place]) 11/11/2015    Past Surgical History:  Procedure Laterality Date  . BACK SURGERY    . CARDIAC SURGERY    . ESOPHAGOGASTRODUODENOSCOPY (EGD) WITH PROPOFOL N/A 12/11/2015   Procedure: ESOPHAGOGASTRODUODENOSCOPY (EGD) WITH PROPOFOL;  Surgeon: San Jetty, MD;  Location: [facility] ENDOSCOPY;  Service: Gastroenterology;  Laterality: N/A;  .  LEFT HEART CATH AND CORONARY ANGIOGRAPHY N/A 11/13/2016   Procedure: Left Heart Cath and Coronary Angiography;  Surgeon: Corey Skains, MD;  Location: [facility];  Service: Cardiovascular;  Laterality: N/A;    Prior to Admission medications   Medication Sig Start Date End Date Taking? Authorizing Provider  aspirin EC 81 MG tablet Take 81 mg by mouth daily.    [provider]  atorvastatin (LIPITOR) 40 MG tablet Take 40 mg by mouth daily. 11/27/16   [provider]  ceFAZolin (ANCEF) 1 g injection 2 g by Other route every 8 (eight) hours.  01/23/17   [provider]  diphenhydrAMINE (BENADRYL) 50 MG capsule Take 50 mg by mouth every 6 (six) hours as needed.    [provider]  gabapentin (NEURONTIN) 300 MG capsule Take 1 capsule (300 mg total) by mouth 3 (three) times daily. Patient not taking: Reported on 03/01/2017 12/14/15   Dustin Flock, MD  hydrOXYzine (ATARAX/VISTARIL) 10 MG tablet Take 1 tablet (10 mg total) by mouth 3 (three) times daily as needed. Patient not taking: Reported on 03/01/2017 12/14/15   Dustin Flock, MD  metoprolol succinate (TOPROL-XL) 25 MG 24 hr tablet Take 12.5 mg by mouth daily.    [provider]  Multiple Vitamin (MULTIVITAMIN WITH MINERALS) TABS tablet Take 1 tablet by mouth daily.    [provider]  oxyCODONE (OXY IR/ROXICODONE) 5 MG immediate release tablet Take 1 tablet by mouth 4 (four) times daily as needed. 11/27/16   [provider]  pantoprazole (PROTONIX) 40 MG tablet Take 40 mg by mouth 2 (two) times daily before a meal.    [provider]  potassium chloride SA (KLOR-CON M20) 20 MEQ tablet Take 40 mEq by mouth daily.    [provider]  prochlorperazine (COMPAZINE) 10 MG tablet Take 1 tablet (10 mg total) by mouth every 6 (six) hours as needed for nausea (headache). 12/25/18   Irean HongSung, Lavine Hargrove J, MD  spironolactone (ALDACTONE) 25 MG tablet Take 50 mg by mouth daily.      [provider]  traZODone (DESYREL) 100 MG tablet Take 1 tablet (100 mg total) by mouth at bedtime. 12/14/15   Auburn BilberryPatel, Shreyang, MD    Allergies Aspirin, Tramadol, Acetaminophen, Latex, Codeine, and Vicodin [hydrocodone-acetaminophen]  Family History  Problem Relation Age of Onset  . CAD Mother   . Diabetes Mother   . Lung cancer Father     Social History Social History   Tobacco Use  . Smoking status: Current Every Day Smoker    Packs/day: 1.00    Types: Cigarettes  . Smokeless tobacco: Never Used  . Tobacco comment: SMOKES 3-4 PKS/DAY  Substance Use Topics  . Alcohol use: Yes    Comment: Drinks occasionally every week  . Drug use: Yes    Types: "Crack" cocaine    Review of Systems  Constitutional: No fever/chills Eyes: No visual changes. ENT: No sore throat. Cardiovascular: Denies chest pain. Respiratory: Positive for cough.  Denies shortness of breath. Gastrointestinal: No abdominal pain.  No nausea, no vomiting.  No diarrhea.  No constipation. Genitourinary: Negative for dysuria. Musculoskeletal: Negative for back pain. Skin: Negative for rash. Neurological: Positive for headache. Negative for focal weakness or numbness.   ____________________________________________   PHYSICAL EXAM:  VITAL SIGNS: ED Triage Vitals [12/24/18 2336]  Enc Vitals Group     BP (!) 163/77     Pulse Rate 86     Resp 18     Temp 98.9 F (37.2 C)     Temp src      SpO2 98 %     Weight 140 lb (63.5 kg)     Height 5\' 4"  (1.626 m)     Head Circumference      Peak Flow      Pain Score 10     Pain Loc      Pain Edu?      Excl. in GC?     Constitutional: Asleep, awakened for exam.  Alert and oriented. Well appearing and in no acute distress. Eyes: Conjunctivae are normal. PERRL. EOMI. Head: Atraumatic. Nose: No congestion/rhinnorhea. Mouth/Throat: Mucous membranes are moist.  Oropharynx non-erythematous. Neck: No stridor.  No carotid bruits.  Supple neck without  meningismus. Cardiovascular: Normal rate, regular rhythm. Grossly normal heart sounds.  Good peripheral circulation. Respiratory: Normal respiratory effort.  No retractions. Lungs CTAB.  No wheezing. Gastrointestinal: Soft and nontender. No distention. No abdominal bruits. No CVA tenderness. Musculoskeletal: No lower extremity tenderness nor edema.  No joint effusions. Neurologic:  Normal speech and language. No gross focal neurologic deficits are appreciated. No gait instability. Skin:  Skin is warm, dry. No rash noted.  Superficial abrasion to sternum which appears raw. Psychiatric: Mood and affect are normal. Speech and behavior are normal.  ____________________________________________   LABS (all labs ordered are listed, but only abnormal results are displayed)  Labs Reviewed  CBC WITH DIFFERENTIAL/PLATELET - Abnormal; Notable for the following components:      Result Value   Hemoglobin 7.6 (*)  HCT 27.5 (*)    MCV 57.9 (*)    MCH 16.0 (*)    MCHC 27.6 (*)    RDW 20.3 (*)    Platelets 124 (*)    All other components within normal limits  COMPREHENSIVE METABOLIC PANEL - Abnormal; Notable for the following components:   CO2 21 (*)    Calcium 8.8 (*)    AST 67 (*)    Alkaline Phosphatase 175 (*)    All other components within normal limits  URINALYSIS, COMPLETE (UACMP) WITH MICROSCOPIC - Abnormal; Notable for the following components:   Color, Urine STRAW (*)    APPearance CLEAR (*)    Specific Gravity, Urine 1.003 (*)    All other components within normal limits  TROPONIN I (HIGH SENSITIVITY)  TROPONIN I (HIGH SENSITIVITY)  TROPONIN I (HIGH SENSITIVITY)   ____________________________________________  EKG  ED ECG REPORT I, Juliahna Wiswell J, the attending physician, personally viewed and interpreted this ECG.   Date: 12/25/2018  EKG Time: 2334  Rate: 84  Rhythm: normal EKG, normal sinus rhythm  Axis: Normal  Intervals:right bundle branch block  ST&T Change:  Nonspecific  ____________________________________________  RADIOLOGY  ED MD interpretation: No acute cardiopulmonary process; no ICH  Official radiology report(s): Ct Head Wo Contrast  Result Date: 12/25/2018 CLINICAL DATA:  58 year old female with hypertension. EXAM: CT HEAD WITHOUT CONTRAST TECHNIQUE: Contiguous axial images were obtained from the base of the skull through the vertex without intravenous contrast. COMPARISON:  Head CT dated 02/04/2016 FINDINGS: Brain: The ventricles and sulci appropriate size for patient's age. The gray-white matter discrimination is preserved. There is no acute intracranial hemorrhage. No mass effect or midline shift. No extra-axial fluid collection. Vascular: No hyperdense vessel or unexpected calcification. Skull: Normal. Negative for fracture or focal lesion. Sinuses/Orbits: Partial opacification of right ethmoid air cell. The remainder of the visualized paranasal sinuses and mastoid air cells are clear. Other: None IMPRESSION: No acute intracranial pathology. Electronically Signed   By: Elgie CollardArash  Radparvar M.D.   On: 12/25/2018 01:00   Dg Chest Port 1 View  Result Date: 12/25/2018 CLINICAL DATA:  58 year old female with cough. EXAM: PORTABLE CHEST 1 VIEW COMPARISON:  Chest radiograph dated 12/21/2017 FINDINGS: Minimal bibasilar atelectatic changes. No focal consolidation, pleural effusion, or pneumothorax. The cardiac silhouette is within normal limits. Coronary vascular calcification and mediastinal surgical clips. No acute osseous pathology. Lower cervical fixation plate. IMPRESSION: No active disease. Electronically Signed   By: Elgie CollardArash  Radparvar M.D.   On: 12/25/2018 00:28    ____________________________________________   PROCEDURES  Procedure(s) performed (including Critical Care):  Procedures   ____________________________________________   INITIAL IMPRESSION / ASSESSMENT AND PLAN / ED COURSE  As part of my medical decision making, I reviewed  the following data within the electronic MEDICAL RECORD NUMBER Nursing notes reviewed and incorporated, Labs reviewed, EKG interpreted, Old chart reviewed, Radiograph reviewed and Notes from prior ED visits     Lonny PrudeSherry N Oltmann was evaluated in Emergency Department on 12/25/2018 for the symptoms described in the history of present illness. She was evaluated in the context of the global COVID-19 pandemic, which necessitated consideration that the patient might be at risk for infection with the SARS-CoV-2 virus that causes COVID-19. Institutional protocols and algorithms that pertain to the evaluation of patients at risk for COVID-19 are in a state of rapid change based on information released by regulatory bodies including the CDC and federal and state organizations. These policies and algorithms were followed during the patient's care in the  ED.   58 year old female who presents with elevated blood pressure, headache and dry cough.  Differential diagnosis includes but is not limited to hypertension induced headache, COPD exacerbation, infectious, metabolic etiologies, etc.  Patient denies taking antihypertensives although I do see metoprolol on her medication list.  Blood pressure is currently 158/72.  Will give IV Compazine and Benadryl for headache.  Obtain CT head, chest x-ray in addition to lab work and urinalysis.  Although there is no wheezing on exam, patient feels she could use a breathing treatment.  Clinical Course as of Dec 24 705  Wed Dec 25, 2018  0103 Stable anemia noted.  Patient denies bloody stools.   [JS]  0515 IV infiltrated and patient declined second IV.  She initially declined repeat troponin but changed her mind and agreed.  She is currently resting in no acute distress.  Feels like her wheezing is better.  Have updated patient of all test results.  Advised her to check her blood pressures at home and bring a log to her PCP.  Perhaps they can increase her metoprolol if her blood  pressures remain elevated.   [JS]  [id number] Patient sleeping soundly.  No wheezing.  Overall feeling significantly better.  Will discharge home with prescription for Compazine for her to use as needed.  Reexamination demonstrates no focal neurological deficits; neck remains supple.  Strict return precautions given.  Patient verbalizes understanding agrees with plan of care.   [JS]    Clinical Course User Index [JS] Irean HongSung, Raphel Stickles J, MD     ____________________________________________   FINAL CLINICAL IMPRESSION(S) / ED DIAGNOSES  Final diagnoses:  Abrasion of chest wall, unspecified laterality, initial encounter  Chronic obstructive pulmonary disease, unspecified COPD type (HCC)  Essential hypertension  Acute nonintractable headache, unspecified headache type     ED Discharge Orders         Ordered    prochlorperazine (COMPAZINE) 10 MG tablet  Every 6 hours PRN     12/25/18 [id number]           Note:  This document was prepared using Dragon voice recognition software and may include unintentional dictation errors.   Irean HongSung, Wadell Craddock J, MD 12/25/18 [phone]

## 2018-12-25 ENCOUNTER — Emergency Department: Payer: Medicare Other

## 2018-12-25 LAB — COMPREHENSIVE METABOLIC PANEL
ALT: 42 U/L (ref 0–44)
AST: 67 U/L — ABNORMAL HIGH (ref 15–41)
Albumin: 3.6 g/dL (ref 3.5–5.0)
Alkaline Phosphatase: 175 U/L — ABNORMAL HIGH (ref 38–126)
Anion gap: 10 (ref 5–15)
BUN: 13 mg/dL (ref 6–20)
CO2: 21 mmol/L — ABNORMAL LOW (ref 22–32)
Calcium: 8.8 mg/dL — ABNORMAL LOW (ref 8.9–10.3)
Chloride: 104 mmol/L (ref 98–111)
Creatinine, Ser: 0.59 mg/dL (ref 0.44–1.00)
GFR calc Af Amer: >60 mL/min (ref >60)
GFR calc non Af Amer: >60 mL/min (ref >60)
Glucose, Bld: 94 mg/dL (ref 70–99)
Potassium: 3.6 mmol/L (ref 3.5–5.1)
Sodium: 135 mmol/L (ref 135–145)
Total Bilirubin: 0.8 mg/dL (ref 0.3–1.2)
Total Protein: 7.6 g/dL (ref 6.5–8.1)

## 2018-12-25 LAB — CBC WITH DIFFERENTIAL/PLATELET
Abs Immature Granulocytes: 0.01 10*3/uL (ref 0.00–0.07)
Basophils Absolute: 0.1 10*3/uL (ref 0.0–0.1)
Basophils Relative: 1 %
Eosinophils Absolute: 0.3 10*3/uL (ref 0.0–0.5)
Eosinophils Relative: 5 %
HCT: 27.5 % — ABNORMAL LOW (ref 36.0–46.0)
Hemoglobin: 7.6 g/dL — ABNORMAL LOW (ref 12.0–15.0)
Immature Granulocytes: 0 %
Lymphocytes Relative: 24 %
Lymphs Abs: 1.6 10*3/uL (ref 0.7–4.0)
MCH: 16 pg — ABNORMAL LOW (ref 26.0–34.0)
MCHC: 27.6 g/dL — ABNORMAL LOW (ref 30.0–36.0)
MCV: 57.9 fL — ABNORMAL LOW (ref 80.0–100.0)
Monocytes Absolute: 0.6 10*3/uL (ref 0.1–1.0)
Monocytes Relative: 9 %
Neutro Abs: 4 10*3/uL (ref 1.7–7.7)
Neutrophils Relative %: 61 %
Platelets: 124 10*3/uL — ABNORMAL LOW (ref 150–400)
RBC: 4.75 MIL/uL (ref 3.87–5.11)
RDW: 20.3 % — ABNORMAL HIGH (ref 11.5–15.5)
Smear Review: DECREASED
WBC: 6.5 10*3/uL (ref 4.0–10.5)
nRBC: 0 % (ref 0.0–0.2)

## 2018-12-25 LAB — URINALYSIS, COMPLETE (UACMP) WITH MICROSCOPIC
Bacteria, UA: NONE SEEN
Bilirubin Urine: NEGATIVE
Glucose, UA: NEGATIVE mg/dL
Hgb urine dipstick: NEGATIVE
Ketones, ur: NEGATIVE mg/dL
Leukocytes,Ua: NEGATIVE
Nitrite: NEGATIVE
Protein, ur: NEGATIVE mg/dL
Specific Gravity, Urine: 1.003 — ABNORMAL LOW (ref 1.005–1.030)
pH: 6 (ref 5.0–8.0)

## 2018-12-25 LAB — TROPONIN I (HIGH SENSITIVITY)
Troponin I (High Sensitivity): 7 ng/L (ref <18)
Troponin I (High Sensitivity): 7 ng/L (ref <18)

## 2018-12-25 MED ORDER — IPRATROPIUM-ALBUTEROL 0.5-2.5 (3) MG/3ML IN SOLN
3.0000 mL | Freq: Once | RESPIRATORY_TRACT | Status: AC
  Administered 2018-12-25: 01:00:00 3 mL via RESPIRATORY_TRACT
  Filled 2018-12-25: qty 3

## 2018-12-25 MED ORDER — BACITRACIN ZINC 500 UNIT/GM EX OINT
TOPICAL_OINTMENT | Freq: Once | CUTANEOUS | Status: AC
  Administered 2018-12-25: 1 via TOPICAL
  Filled 2018-12-25: qty 0.9

## 2018-12-25 MED ORDER — PROCHLORPERAZINE EDISYLATE 10 MG/2ML IJ SOLN
2.5000 mg | Freq: Once | INTRAMUSCULAR | Status: AC
  Administered 2018-12-25: 01:00:00 2.5 mg via INTRAVENOUS
  Filled 2018-12-25: qty 2

## 2018-12-25 MED ORDER — PROCHLORPERAZINE MALEATE 10 MG PO TABS: 10.0000 mg | ORAL_TABLET | Freq: Four times a day (QID) | ORAL | 0 refills | Status: DC | PRN

## 2018-12-25 MED ORDER — DIPHENHYDRAMINE HCL 50 MG/ML IJ SOLN
12.5000 mg | Freq: Once | INTRAMUSCULAR | Status: AC
  Administered 2018-12-25: 12.5 mg via INTRAVENOUS
  Filled 2018-12-25: qty 1

## 2018-12-25 NOTE — Discharge Instructions (Addendum)
1.  You may take Compazine as needed for headaches. 2.  Take your medicines daily as directed by your doctor. 3.  If you are able, log your blood pressures morning, noon and evening and take these to your doctor when you follow-up. 4.  Return to the ER for worsening symptoms, persistent vomiting, difficulty breathing, lethargy or other concerns.

## 2018-12-25 NOTE — ED Notes (Signed)
Report give to Ariel.

## 2019-01-02 ENCOUNTER — Ambulatory Visit: Payer: Medicare Other | Admitting: Licensed Clinical Social Worker

## 2019-01-22 ENCOUNTER — Ambulatory Visit: Payer: Medicare Other | Admitting: Licensed Clinical Social Worker

## 2019-01-23 ENCOUNTER — Other Ambulatory Visit: Payer: Self-pay

## 2019-01-23 ENCOUNTER — Ambulatory Visit: Payer: Medicare Other | Admitting: Licensed Clinical Social Worker

## 2019-02-09 IMAGING — CT CT CHEST W/ CM
2 of 3 series · 14 of 36 positions shown, 17 images · IV contrast (iopamidol)
Comparison: Radiograph 12/03/2016, CT chest 12/13/2015

CLINICAL DATA: Chest wall pain history of recent CABG

EXAM:
CT CHEST WITH CONTRAST
TECHNIQUE: Multidetector CT imaging of the chest was performed during
intravenous contrast administration.
CONTRAST:  75mL VAVS5M-1BB IOPAMIDOL (VAVS5M-1BB) INJECTION 61%

[Series 2: axial st · axial · 0.79mm/px · z∈[+322,+574]mm · 11 of 150 slices shown, 14 images]
[im 12/150  mediastinal]
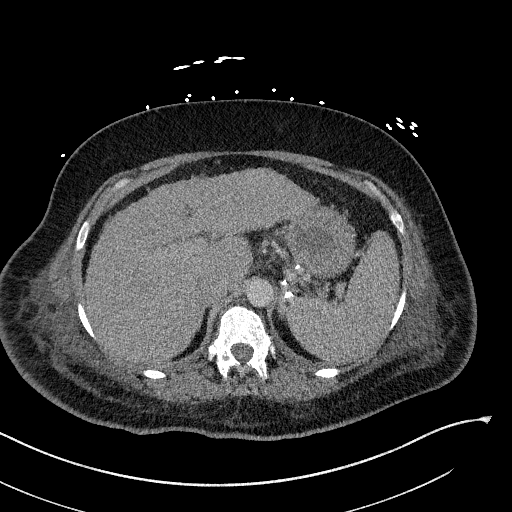
[im 12/150  lung]
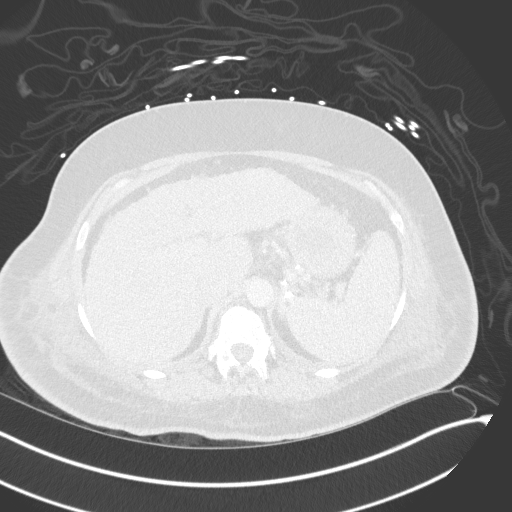
[im 23/150  lung]
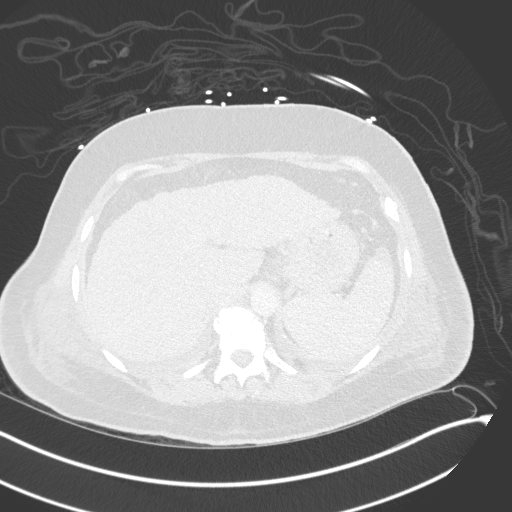
[im 34/150  lung]
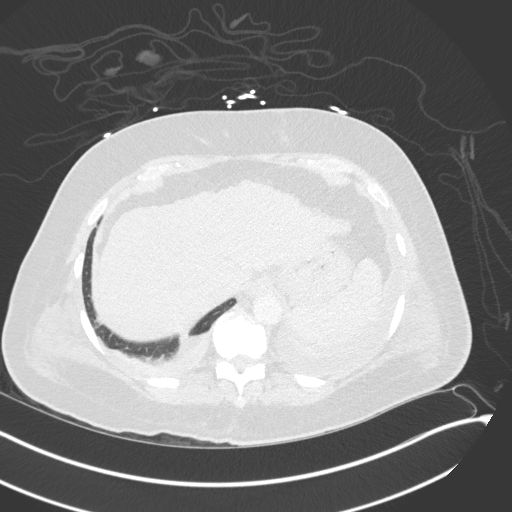
[im 50/150  lung]
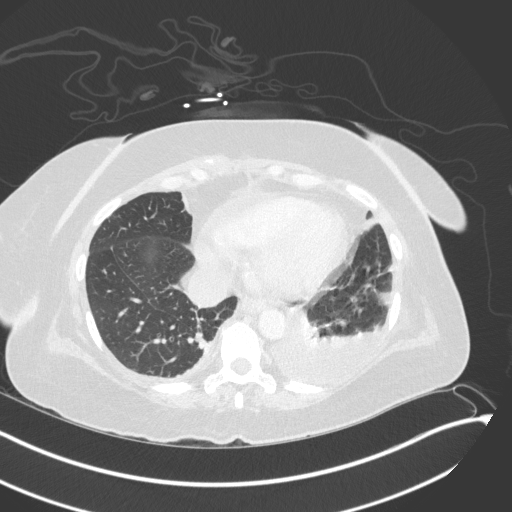
[im 61/150  mediastinal]
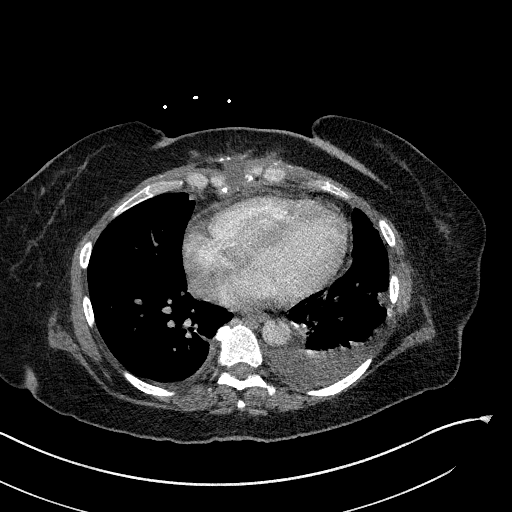
[im 61/150  lung]
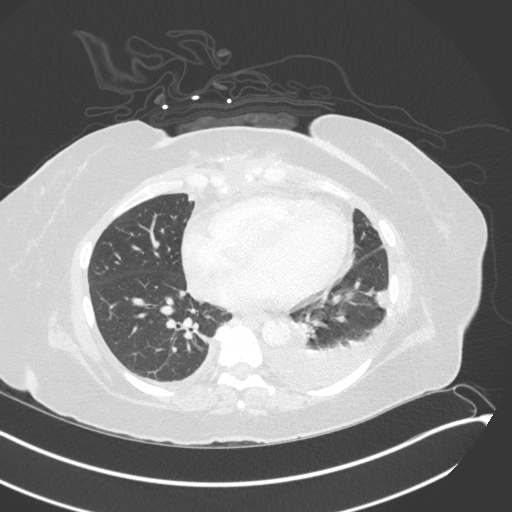
[im 78/150  lung]
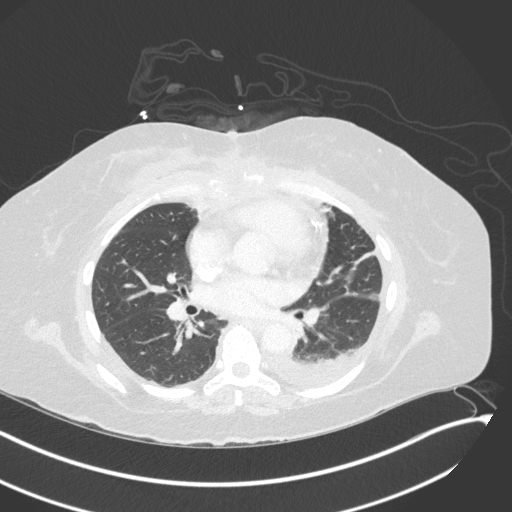
[im 89/150  lung]
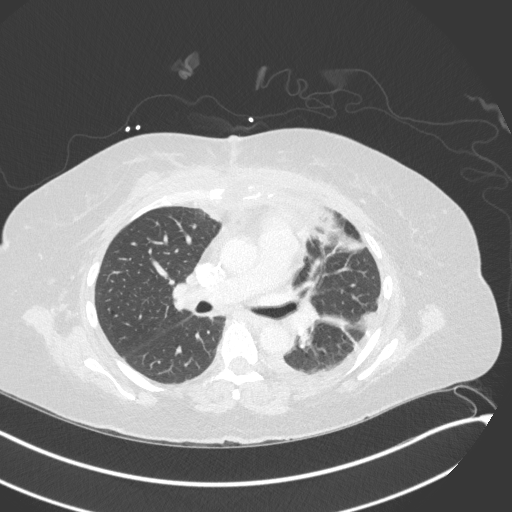
[im 100/150  lung]
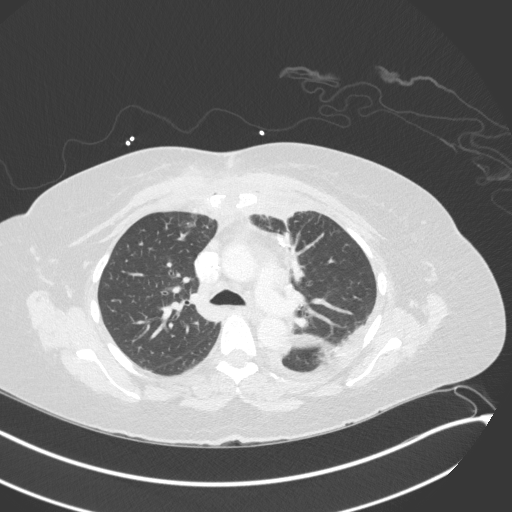
[im 116/150  mediastinal]
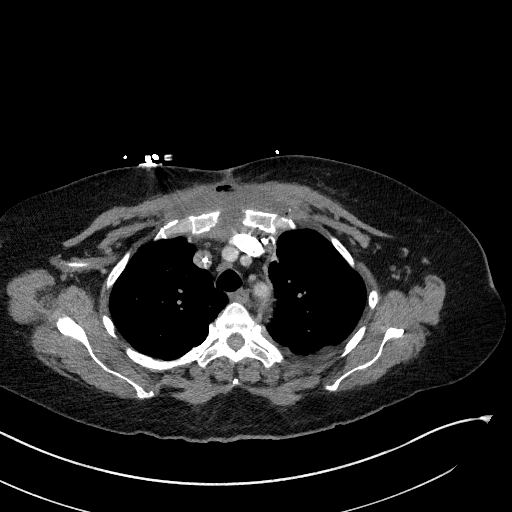
[im 116/150  lung]
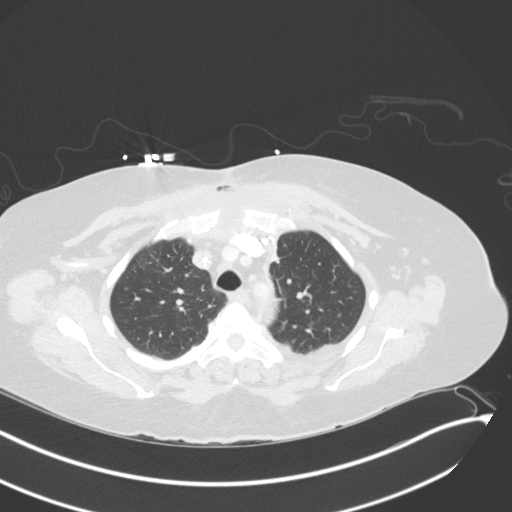
[im 127/150  lung]
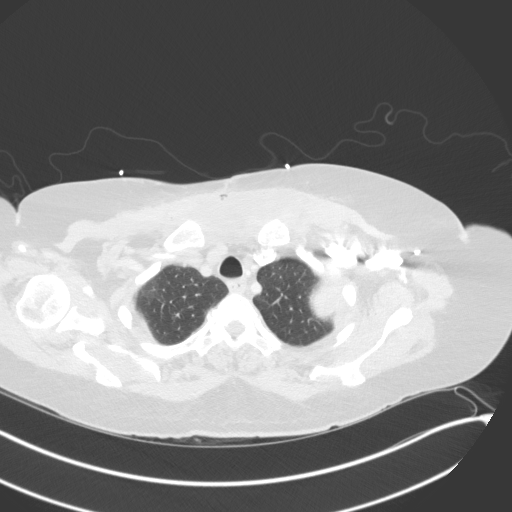
[im 138/150  lung]
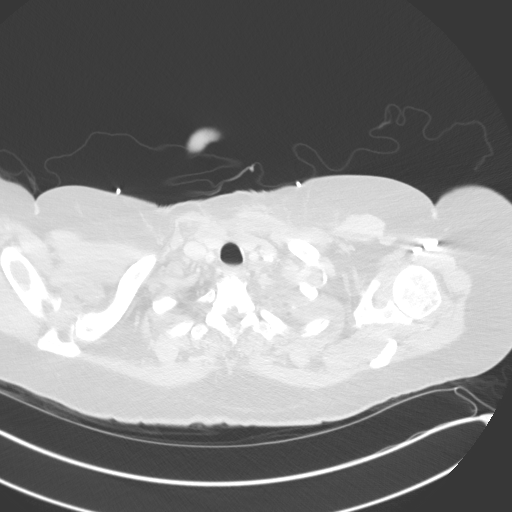

[Series 5: coronal · coronal · 0.61mm/px · 3 of 112 slices shown]
[im 23/112  lung]
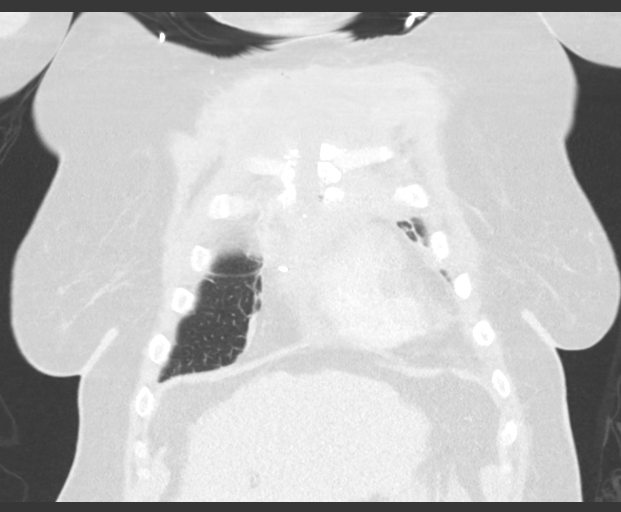
[im 45/112  lung]
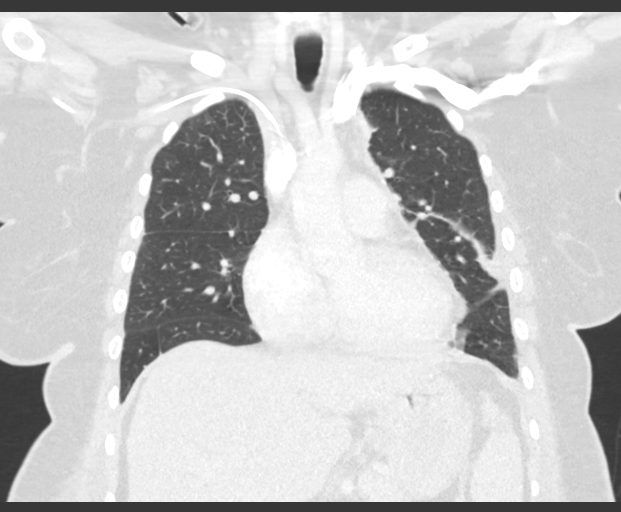
[im 67/112  lung]
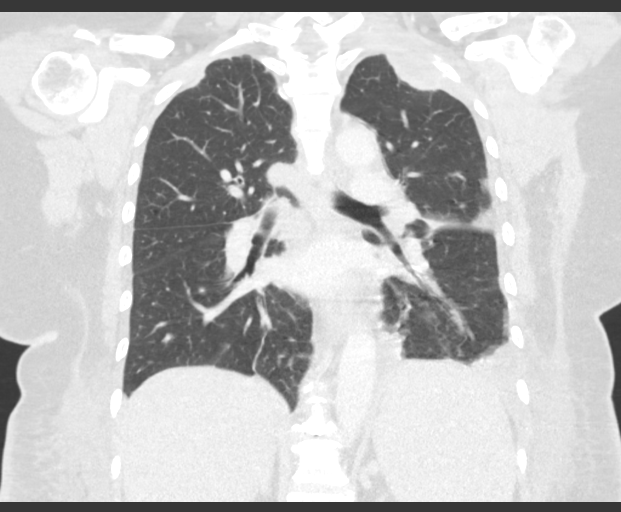

[14 of 36 positions shown; findings below may reference images not displayed]

FINDINGS: Cardiovascular: Non aneurysmal aorta. Atherosclerotic calcification.
Post CABG changes. Coronary artery calcification. Heart size upper
normal. Trace pericardial effusion. Right upper extremity catheter
tip within the distal SVC.

Mediastinum/Nodes: Gas collection within the anterior aspect of the
superior mediastinum with small adjacent fluid. Some soft tissue
stranding within the upper, anterior mediastinum. Midline trachea.
No thyroid mass. Multiple subcentimeter lymph nodes. Largest lymph
node is seen in the precarinal region and measures 9 mm.

Lungs/Pleura: Small left pleural effusion and trace right pleural
effusion. Negative for pneumothorax. Streaky atelectasis in the
lingula and right base. Subpleural nodularity in the left base
without cavitation.

Upper Abdomen: Nodular liver contour suspect for cirrhosis.
Subcentimeter hypodensities within the left lobe too small to
further characterize. Surgical clips in the gallbladder fossa

Musculoskeletal: Previous sternotomy. Removal of sternal wire
sutures since radiograph 12/03/2016. Sternal defect, along the
entire length of the sternum consistent with prior sternotomy.
Slightly irregular and fragmented appearance of the cut edges of the
sternum. Large fluid collection within the sternotomy, this measures
16.3 cm cranial caudad. More focal fluid and gas containing
collection contiguous with the sternal defect at the level of
manubrium, measuring 8.5 cm transverse by 2 cm AP. There is
overlying soft tissue stranding. Small gas and fluid collections
along the inferior aspect of the sternum as well. Separate gas and
fluid collection anterior to the right inferior chest wall measuring
5.2 by 1.2 cm.

Bony irregularity, suspected fracture of the left first and second
ribs. Adjacent extrapleural complex fluid collection at the left
lung apex, measuring 4.2 cm by 2.2 cm by 4.5 cm.
IMPRESSION: 1. Interim removal of sternal sutures since 12/03/2016 radiograph.
Large sternal defect, spanning the length of sternum, which is
filled with fluid. Multiple gas and fluid collections which appear
contiguous with the sternal defect and fluid with overlying soft
tissue stranding and thickening, concerning for abscess. Additional
separate fluid collection in the subcutaneous soft tissues of the
anterior inferior chest wall on the right. Cut edges of the sternum
appear slightly irregular and fragmented raising concern for
osteomyelitis. Fluid and gas collection extend into the anterior,
superior aspect of the mediastinum and there is mild soft tissue
stranding within the anterior aspect of the mediastinum, concerning
for mediastinal infection.
2. Complex fluid collection at the apex of left lung which appears
extrapleural. This appears to be associated with left first and
second rib fractures. Uncertain if the pleural collection represents
hematoma or additional extrapleural abscess, fractures in the first
and second ribs could be pathologic, i.e. related to osteomyelitis
given findings in the sternum.
3. Small left greater than right pleural effusions. Left lower lobe
subpleural nodular focus of airspace disease, nonspecific although
small septic embolus is a consideration.
4. Cirrhosis

Aortic Atherosclerosis ([id number]).

## 2019-04-28 ENCOUNTER — Other Ambulatory Visit: Payer: Self-pay

## 2019-04-28 ENCOUNTER — Encounter: Payer: Self-pay | Admitting: Emergency Medicine

## 2019-04-28 DIAGNOSIS — Z91040 Latex allergy status: Secondary | ICD-10-CM | POA: Insufficient documentation

## 2019-04-28 DIAGNOSIS — I2510 Atherosclerotic heart disease of native coronary artery without angina pectoris: Secondary | ICD-10-CM | POA: Insufficient documentation

## 2019-04-28 DIAGNOSIS — I509 Heart failure, unspecified: Secondary | ICD-10-CM | POA: Insufficient documentation

## 2019-04-28 DIAGNOSIS — Z79899 Other long term (current) drug therapy: Secondary | ICD-10-CM | POA: Insufficient documentation

## 2019-04-28 DIAGNOSIS — J449 Chronic obstructive pulmonary disease, unspecified: Secondary | ICD-10-CM | POA: Diagnosis not present

## 2019-04-28 DIAGNOSIS — K529 Noninfective gastroenteritis and colitis, unspecified: Secondary | ICD-10-CM | POA: Diagnosis not present

## 2019-04-28 DIAGNOSIS — Z7982 Long term (current) use of aspirin: Secondary | ICD-10-CM | POA: Insufficient documentation

## 2019-04-28 DIAGNOSIS — F17210 Nicotine dependence, cigarettes, uncomplicated: Secondary | ICD-10-CM | POA: Diagnosis not present

## 2019-04-28 DIAGNOSIS — I110 Hypertensive heart disease with heart failure: Secondary | ICD-10-CM | POA: Insufficient documentation

## 2019-04-28 DIAGNOSIS — R197 Diarrhea, unspecified: Secondary | ICD-10-CM | POA: Diagnosis present

## 2019-04-28 LAB — CBC
HCT: 28.8 % — ABNORMAL LOW (ref 36.0–46.0)
Hemoglobin: 8.3 g/dL — ABNORMAL LOW (ref 12.0–15.0)
MCH: 16.2 pg — ABNORMAL LOW (ref 26.0–34.0)
MCHC: 28.8 g/dL — ABNORMAL LOW (ref 30.0–36.0)
MCV: 56.4 fL — ABNORMAL LOW (ref 80.0–100.0)
Platelets: 139 10*3/uL — ABNORMAL LOW (ref 150–400)
RBC: 5.11 MIL/uL (ref 3.87–5.11)
RDW: 20.8 % — ABNORMAL HIGH (ref 11.5–15.5)
WBC: 5.3 10*3/uL (ref 4.0–10.5)
nRBC: 0 % (ref 0.0–0.2)

## 2019-04-28 LAB — URINALYSIS, COMPLETE (UACMP) WITH MICROSCOPIC
Bacteria, UA: NONE SEEN
Bilirubin Urine: NEGATIVE
Glucose, UA: NEGATIVE mg/dL
Hgb urine dipstick: NEGATIVE
Ketones, ur: NEGATIVE mg/dL
Leukocytes,Ua: NEGATIVE
Nitrite: NEGATIVE
Protein, ur: NEGATIVE mg/dL
Specific Gravity, Urine: 1.011 (ref 1.005–1.030)
pH: 5 (ref 5.0–8.0)

## 2019-04-28 LAB — COMPREHENSIVE METABOLIC PANEL
ALT: 28 U/L (ref 0–44)
AST: 48 U/L — ABNORMAL HIGH (ref 15–41)
Albumin: 3.7 g/dL (ref 3.5–5.0)
Alkaline Phosphatase: 176 U/L — ABNORMAL HIGH (ref 38–126)
Anion gap: 9 (ref 5–15)
BUN: 9 mg/dL (ref 6–20)
CO2: 20 mmol/L — ABNORMAL LOW (ref 22–32)
Calcium: 8.6 mg/dL — ABNORMAL LOW (ref 8.9–10.3)
Chloride: 107 mmol/L (ref 98–111)
Creatinine, Ser: 0.75 mg/dL (ref 0.44–1.00)
GFR calc Af Amer: >60 mL/min (ref >60)
GFR calc non Af Amer: >60 mL/min (ref >60)
Glucose, Bld: 103 mg/dL — ABNORMAL HIGH (ref 70–99)
Potassium: 3.9 mmol/L (ref 3.5–5.1)
Sodium: 136 mmol/L (ref 135–145)
Total Bilirubin: 0.7 mg/dL (ref 0.3–1.2)
Total Protein: 7.9 g/dL (ref 6.5–8.1)

## 2019-04-28 LAB — AMMONIA: Ammonia: 30 umol/L (ref 9–35)

## 2019-04-28 LAB — LIPASE, BLOOD: Lipase: 62 U/L — ABNORMAL HIGH (ref 11–51)

## 2019-04-28 MED ORDER — SODIUM CHLORIDE 0.9% FLUSH: 3.0000 mL | Freq: Once | INTRAVENOUS | Status: DC

## 2019-04-28 NOTE — ED Triage Notes (Signed)
C/O diarrhea x 3 days.  Denies N/V.

## 2019-04-29 ENCOUNTER — Emergency Department: Payer: Medicare Other

## 2019-04-29 ENCOUNTER — Emergency Department
Admission: EM | Admit: 2019-04-29 | Discharge: 2019-04-29 | Disposition: A | Discharge status: Home / Self Care | Payer: Medicare Other | Attending: Emergency Medicine | Admitting: Emergency Medicine

## 2019-04-29 ENCOUNTER — Encounter: Payer: Self-pay | Admitting: Radiology

## 2019-04-29 DIAGNOSIS — K529 Noninfective gastroenteritis and colitis, unspecified: Secondary | ICD-10-CM

## 2019-04-29 LAB — TROPONIN I (HIGH SENSITIVITY): Troponin I (High Sensitivity): 5 ng/L

## 2019-04-29 MED ORDER — AMOXICILLIN-POT CLAVULANATE 875-125 MG PO TABS: 1.0000 | ORAL_TABLET | Freq: Two times a day (BID) | ORAL | 0 refills | Status: AC

## 2019-04-29 MED ORDER — MORPHINE SULFATE (PF) 2 MG/ML IV SOLN
2.0000 mg | Freq: Once | INTRAVENOUS | Status: AC
  Administered 2019-04-29: 03:00:00 2 mg via INTRAVENOUS
  Filled 2019-04-29: qty 1

## 2019-04-29 MED ORDER — IOHEXOL 300 MG/ML  SOLN
100.0000 mL | Freq: Once | INTRAMUSCULAR | Status: AC | PRN
  Administered 2019-04-29: 100 mL via INTRAVENOUS

## 2019-04-29 NOTE — ED Notes (Signed)
Pt sitting in lobby with no distress noted; updated on wait time and vs retaken 

## 2019-04-29 NOTE — ED Provider Notes (Signed)
[facility] Emergency Department Provider Note ___________________   First MD Initiated Contact with Patient 04/29/19 0136     (approximate)  I have reviewed the triage vital signs and the nursing notes.   HISTORY  Chief Complaint Diarrhea    HPI Meghan Jimenez is a 58 y.o. female with below list of previous medical conditions presents to the emergency department secondary to a 3-day history of diarrhea.  Patient also admits to generalized abdominal cramping.  Patient denies any nausea or vomiting.  Patient denies any urinary symptoms.  Patient denies any fever.        Past Medical History:  Diagnosis Date  . CAD (coronary artery disease)   . CHF (congestive heart failure) (HCC)   . Cirrhosis (HCC)   . COPD (chronic obstructive pulmonary disease) (HCC)   . Diabetes mellitus without complication (HCC)   . Hepatitis C   . Hypertension     Patient Active Problem List   Diagnosis Date Noted  . Chest pain 11/12/2016  . PTSD (post-traumatic stress disorder) 12/13/2015  . Dysthymia 12/13/2015  . GIB (gastrointestinal bleeding) 12/10/2015  . Unstable angina (HCC) 11/11/2015    Past Surgical History:  Procedure Laterality Date  . BACK SURGERY    . CARDIAC SURGERY    . ESOPHAGOGASTRODUODENOSCOPY (EGD) WITH PROPOFOL N/A 12/11/2015   Procedure: ESOPHAGOGASTRODUODENOSCOPY (EGD) WITH PROPOFOL;  Surgeon: [name], MD;  Location: [facility] ENDOSCOPY;  Service: Gastroenterology;  Laterality: N/A;  . LEFT HEART CATH AND CORONARY ANGIOGRAPHY N/A 11/13/2016   Procedure: Left Heart Cath and Coronary Angiography;  Surgeon: [name], MD;  Location: [facility] INVASIVE CV LAB;  Service: Cardiovascular;  Laterality: N/A;    Prior to Admission medications   Medication Sig Start Date End Date Taking? Authorizing Provider  amoxicillin-clavulanate (AUGMENTIN) 875-125 MG tablet Take 1 tablet by mouth 2 (two) times daily for 10 days. 04/29/19 05/09/19  Darci Current, MD  aspirin EC 81 MG tablet Take 81 mg by mouth daily.    [provider]  atorvastatin (LIPITOR) 40 MG tablet Take 40 mg by mouth daily. 11/27/16   [provider]  ceFAZolin (ANCEF) 1 g injection 2 g by Other route every 8 (eight) hours.  01/23/17   [provider]  diphenhydrAMINE (BENADRYL) 50 MG capsule Take 50 mg by mouth every 6 (six) hours as needed.    [provider]  gabapentin (NEURONTIN) 300 MG capsule Take 1 capsule (300 mg total) by mouth 3 (three) times daily. Patient not taking: Reported on 03/01/2017 12/14/15   Auburn Bilberry, MD  hydrOXYzine (ATARAX/VISTARIL) 10 MG tablet Take 1 tablet (10 mg total) by mouth 3 (three) times daily as needed. Patient not taking: Reported on 03/01/2017 12/14/15   Auburn Bilberry, MD  metoprolol succinate (TOPROL-XL) 25 MG 24 hr tablet Take 12.5 mg by mouth daily.    [provider]  Multiple Vitamin (MULTIVITAMIN WITH MINERALS) TABS tablet Take 1 tablet by mouth daily.    [provider]  oxyCODONE (OXY IR/ROXICODONE) 5 MG immediate release tablet Take 1 tablet by mouth 4 (four) times daily as needed. 11/27/16   [provider]  pantoprazole (PROTONIX) 40 MG tablet Take 40 mg by mouth 2 (two) times daily before a meal.    [provider]  potassium chloride SA (KLOR-CON M20) 20 MEQ tablet Take 40 mEq by mouth daily.    [provider]  prochlorperazine (COMPAZINE) 10 MG tablet Take 1 tablet (10 mg total)  by mouth every 6 (six) hours as needed for nausea (headache). 12/25/18   Paulette Blanch, MD  spironolactone (ALDACTONE) 25 MG tablet Take 50 mg by mouth daily.     [provider]  traZODone (DESYREL) 100 MG tablet Take 1 tablet (100 mg total) by mouth at bedtime. 12/14/15   Dustin Flock, MD    Allergies Aspirin, Tramadol, Acetaminophen, Latex, Codeine, and Vicodin [hydrocodone-acetaminophen]  Family History  Problem Relation Age of Onset  . CAD  Mother   . Diabetes Mother   . Lung cancer Father     Social History Social History   Tobacco Use  . Smoking status: Current Every Day Smoker    Packs/day: 1.00    Types: Cigarettes  . Smokeless tobacco: Never Used  . Tobacco comment: SMOKES 3-4 PKS/DAY  Substance Use Topics  . Alcohol use: Yes    Comment: Drinks occasionally every week  . Drug use: Yes    Types: "Crack" cocaine    Review of Systems Constitutional: No fever/chills Eyes: No visual changes. ENT: No sore throat. Cardiovascular: Denies chest pain. Respiratory: Denies shortness of breath. Gastrointestinal: No abdominal pain.  No nausea, no vomiting.  Positive for diarrhea.  No constipation. Genitourinary: Negative for dysuria. Musculoskeletal: Negative for neck pain.  Negative for back pain. Integumentary: Negative for rash. Neurological: Negative for headaches, focal weakness or numbness.  ____________________________________________   PHYSICAL EXAM:  VITAL SIGNS: ED Triage Vitals  Enc Vitals Group     BP 04/28/19 1656 (!) 155/91     Pulse Rate 04/28/19 1656 (!) 113     Resp 04/28/19 1656 20     Temp 04/28/19 1656 99 F (37.2 C)     Temp Source 04/28/19 1656 Oral     SpO2 04/28/19 1656 97 %     Weight 04/28/19 1657 61.7 kg (136 lb)     Height 04/28/19 1657 1.626 m (5\' 4" )     Head Circumference --      Peak Flow --      Pain Score 04/28/19 1703 9     Pain Loc --      Pain Edu? --      Excl. in [place]? --     Constitutional: Alert and oriented.  Eyes: Conjunctivae are normal.  Mouth/Throat: Patient is wearing a mask. Neck: No stridor.  No meningeal signs.   Cardiovascular: Normal rate, regular rhythm. Good peripheral circulation. Grossly normal heart sounds. Respiratory: Normal respiratory effort.  No retractions. Gastrointestinal: Generalized tenderness to very mild palpation.. No distention.  Musculoskeletal: No lower extremity tenderness nor edema. No gross deformities of  extremities. Neurologic:  Normal speech and language. No gross focal neurologic deficits are appreciated.  Skin:  Skin is warm, dry and intact. Psychiatric: Mood and affect are normal. Speech and behavior are normal.  ____________________________________________   LABS (all labs ordered are listed, but only abnormal results are displayed)  Labs Reviewed  LIPASE, BLOOD - Abnormal; Notable for the following components:      Result Value   Lipase 62 (*)    All other components within normal limits  COMPREHENSIVE METABOLIC PANEL - Abnormal; Notable for the following components:   CO2 20 (*)    Glucose, Bld 103 (*)    Calcium 8.6 (*)    AST 48 (*)    Alkaline Phosphatase 176 (*)    All other components within normal limits  CBC - Abnormal; Notable for the following components:   Hemoglobin 8.3 (*)  HCT 28.8 (*)    MCV 56.4 (*)    MCH 16.2 (*)    MCHC 28.8 (*)    RDW 20.8 (*)    Platelets 139 (*)    All other components within normal limits  URINALYSIS, COMPLETE (UACMP) WITH MICROSCOPIC - Abnormal; Notable for the following components:   Color, Urine YELLOW (*)    APPearance CLEAR (*)    All other components within normal limits  GI PATHOGEN PANEL BY PCR, STOOL  C DIFFICILE QUICK SCREEN W PCR REFLEX  AMMONIA  TROPONIN I (HIGH SENSITIVITY)   ____________________________________________  EKG  ED ECG REPORT I, [place] N Zevin Nevares, the attending physician, personally viewed and interpreted this ECG.   Date: 04/29/2019  EKG Time: 1:52 AM  Rate: 89  Rhythm: Normal sinus rhythm with a right bundle branch block  Axis: Normal  Intervals: Normal  ST&T Change: None  ____________________________________________  RADIOLOGY I, [place] N Ambika Zettlemoyer, personally viewed and evaluated these images (plain radiographs) as part of my medical decision making, as well as reviewing the written report by the radiologist.  ED MD interpretation: CT abdomen pelvis revealed findings consistent  with possible mild colitis per radiologist.  Official radiology report(s): CT CHEST WO CONTRAST  Result Date: 04/29/2019 CLINICAL DATA:  Abdominal pain shortness of breath EXAM: CT CHEST WITHOUT CONTRAST TECHNIQUE: Multidetector CT imaging of the chest was performed following the standard protocol without IV contrast. COMPARISON:  CT abdomen pelvis 04/29/2019, CT chest 12/22/2017, 03/02/2017 FINDINGS: Cardiovascular: Limited evaluation without intravenous contrast. Mild aortic atherosclerosis. No aneurysmal dilatation. Heart size within normal limits. No pericardial effusion. Mediastinum/Nodes: Midline trachea. No thyroid mass. Similar appearance of multiple subcentimeter mediastinal lymph nodes. Esophagus within normal limits. Lungs/Pleura: Patchy consolidations at the bases with bandlike configuration on reconstructed views most likely representing atelectasis. Small ground-glass focus within the medial right base, present on 2019 study and therefore chronic, possible mild fibrosis. Upper Abdomen: Cirrhotic morphology of the liver with subcentimeter hypodensities. Partially visualized spleen appears slightly enlarged. Musculoskeletal: Sternal defect with herniated fatty tissue through the inferior aspect of the defect. Healing right fifth lateral rib fracture. Degenerative changes of the spine. Incompletely visualized hardware within the lower cervical spine. IMPRESSION: 1. Linear and band like densities at the right greater than left lung bases, favored to represent subsegmental atelectasis. Small focus of ground-glass density in the medial right base appears chronic to 2019 CT. 2. Large sternal defect without significant change from 11/2017 prior study. 3. Cirrhosis of the liver.  Probable splenomegaly Aortic Atherosclerosis (ICD10-I70.0). Electronically Signed   By: Jasmine Pang M.D.   On: 04/29/2019 03:19   CT ABDOMEN PELVIS W CONTRAST  Result Date: 04/29/2019 CLINICAL DATA:  Initial evaluation for  acute generalized abdominal pain. EXAM: CT ABDOMEN AND PELVIS WITH CONTRAST TECHNIQUE: Multidetector CT imaging of the abdomen and pelvis was performed using the standard protocol following bolus administration of intravenous contrast. CONTRAST:  OMNIPAQUE IOHEXOL 300 MG/ML  SOLN COMPARISON:  Prior CT from 07/21/2010. FINDINGS: Lower chest: Scattered linear atelectatic changes and/or scarring seen at the visualized lung bases. Partially visualized lungs are otherwise grossly clear. Hepatobiliary: Morphologic changes consistent with cirrhosis seen within the liver. 6 mm hypodensity within left hepatic lobe noted, too small the characterize, but slightly increased in size from previous. This is most likely benign given presence on prior exam from 2012. Patient is status post cholecystectomy. No biliary dilatation. Pancreas: Pancreas within normal limits. Spleen: Spleen measures at the upper limits of normal at 14 cm  in craniocaudad dimension. Spleen otherwise unremarkable. Adrenals/Urinary Tract: Adrenal glands within normal limits. Kidneys equal in size with symmetric enhancement. 2-3 mm nonobstructive nephrolithiasis noted bilaterally. No hydronephrosis or hydroureter. No focal enhancing renal mass. Partially distended bladder within normal limits. Stomach/Bowel: Stomach largely decompressed without acute abnormality. No evidence for bowel obstruction. Appendix not definitely visualized, however, no findings to suggest acute appendicitis seen within the right lower quadrant or about the cecum. Mild circumferential wall thickening seen about the descending and sigmoid colon. While this finding may be related incomplete distension, a degree of mild colitis is difficult to exclude. No other acute inflammatory changes seen about the bowels. Vascular/Lymphatic: Moderate to advanced aorto bi-iliac atherosclerotic disease. No aneurysm. Mesenteric vessels patent proximally. Multiple scattered varices noted within the  upper abdomen, consistent with portal hypertension. Main portal vein, splenic vein, and SMV are grossly patent. No pathologically enlarged intra-abdominal or pelvic lymph nodes. Reproductive: Uterus and ovaries within normal limits. Other: No ascites. No free air within the abdomen. Musculoskeletal: No acute osseous abnormality. No worrisome lytic or blastic osseous lesions. Postsurgical changes noted within the lumbar spine. IMPRESSION: 1. Mild circumferential wall thickening about the descending and sigmoid colon. While this finding may be related to incomplete distension, a degree of mild colitis is difficult to exclude, and could be considered in the correct clinical setting. 2. Cirrhosis with sequelae of portal hypertension.  No ascites. 3. Bilateral nonobstructive nephrolithiasis. 4.  Aortic Atherosclerosis (ICD10-I70.0). Electronically Signed   By: Rise Mu M.D.   On: 04/29/2019 03:15     Procedures   ____________________________________________   INITIAL IMPRESSION / MDM / ASSESSMENT AND PLAN / ED COURSE  As part of my medical decision making, I reviewed the following data within the electronic MEDICAL RECORD NUMBER   58 year old female presented with above-stated history and physical exam with differential diagnosis including but not limited to colitis diverticulitis.  Stool culture was ordered however patient has had no further bowel movements while in the emergency department.  CT scan findings consistent with mild colitis.  Patient given IV fluids in the emergency      ____________________________________________  FINAL CLINICAL IMPRESSION(S) / ED DIAGNOSES  Final diagnoses:  Colitis     MEDICATIONS GIVEN DURING THIS VISIT:  Medications  morphine 2 MG/ML injection 2 mg (2 mg Intravenous Given 04/29/19 0306)  iohexol (OMNIPAQUE) 300 MG/ML solution 100 mL (100 mLs Intravenous Contrast Given 04/29/19 0241)     ED Discharge Orders         Ordered     amoxicillin-clavulanate (AUGMENTIN) 875-125 MG tablet  2 times daily     04/29/19 0457          *Please note:  Meghan Jimenez was evaluated in Emergency Department on 04/29/2019 for the symptoms described in the history of present illness. She was evaluated in the context of the global COVID-19 pandemic, which necessitated consideration that the patient might be at risk for infection with the SARS-CoV-2 virus that causes COVID-19. Institutional protocols and algorithms that pertain to the evaluation of patients at risk for COVID-19 are in a state of rapid change based on information released by regulatory bodies including the CDC and federal and state organizations. These policies and algorithms were followed during the patient's care in the ED.  Some ED evaluations and interventions may be delayed as a result of limited staffing during the pandemic.*  Note:  This document was prepared using Dragon voice recognition software and may include unintentional dictation errors.  Darci CurrentBrown, [place] N, MD 04/29/19 [id number]

## 2019-05-22 ENCOUNTER — Other Ambulatory Visit: Payer: Self-pay | Admitting: Obstetrics and Gynecology

## 2019-05-22 DIAGNOSIS — Z1231 Encounter for screening mammogram for malignant neoplasm of breast: Secondary | ICD-10-CM

## 2019-05-22 DIAGNOSIS — Z1211 Encounter for screening for malignant neoplasm of colon: Secondary | ICD-10-CM

## 2019-09-11 ENCOUNTER — Other Ambulatory Visit: Payer: Self-pay | Admitting: Obstetrics and Gynecology

## 2019-09-11 DIAGNOSIS — Z1231 Encounter for screening mammogram for malignant neoplasm of breast: Secondary | ICD-10-CM

## 2019-09-11 DIAGNOSIS — E039 Hypothyroidism, unspecified: Secondary | ICD-10-CM

## 2019-09-11 DIAGNOSIS — K7460 Unspecified cirrhosis of liver: Secondary | ICD-10-CM

## 2019-09-11 DIAGNOSIS — I10 Essential (primary) hypertension: Secondary | ICD-10-CM

## 2019-09-11 DIAGNOSIS — I2510 Atherosclerotic heart disease of native coronary artery without angina pectoris: Secondary | ICD-10-CM

## 2019-09-20 ENCOUNTER — Encounter: Payer: Self-pay | Admitting: Radiology

## 2019-09-20 ENCOUNTER — Other Ambulatory Visit: Payer: Self-pay

## 2019-09-20 ENCOUNTER — Emergency Department
Admission: EM | Admit: 2019-09-20 | Discharge: 2019-09-21 | Disposition: A | Discharge status: Home / Self Care | Payer: Medicare Other | Attending: Emergency Medicine | Admitting: Emergency Medicine

## 2019-09-20 ENCOUNTER — Emergency Department: Payer: Medicare Other

## 2019-09-20 DIAGNOSIS — I2510 Atherosclerotic heart disease of native coronary artery without angina pectoris: Secondary | ICD-10-CM | POA: Diagnosis not present

## 2019-09-20 DIAGNOSIS — J449 Chronic obstructive pulmonary disease, unspecified: Secondary | ICD-10-CM | POA: Insufficient documentation

## 2019-09-20 DIAGNOSIS — F17210 Nicotine dependence, cigarettes, uncomplicated: Secondary | ICD-10-CM | POA: Insufficient documentation

## 2019-09-20 DIAGNOSIS — Z79899 Other long term (current) drug therapy: Secondary | ICD-10-CM | POA: Insufficient documentation

## 2019-09-20 DIAGNOSIS — I509 Heart failure, unspecified: Secondary | ICD-10-CM | POA: Insufficient documentation

## 2019-09-20 DIAGNOSIS — Z91040 Latex allergy status: Secondary | ICD-10-CM | POA: Diagnosis not present

## 2019-09-20 DIAGNOSIS — R0789 Other chest pain: Secondary | ICD-10-CM | POA: Diagnosis not present

## 2019-09-20 DIAGNOSIS — Z7982 Long term (current) use of aspirin: Secondary | ICD-10-CM | POA: Insufficient documentation

## 2019-09-20 DIAGNOSIS — E119 Type 2 diabetes mellitus without complications: Secondary | ICD-10-CM | POA: Diagnosis not present

## 2019-09-20 DIAGNOSIS — Z951 Presence of aortocoronary bypass graft: Secondary | ICD-10-CM | POA: Diagnosis not present

## 2019-09-20 DIAGNOSIS — G8929 Other chronic pain: Secondary | ICD-10-CM

## 2019-09-20 DIAGNOSIS — I110 Hypertensive heart disease with heart failure: Secondary | ICD-10-CM | POA: Insufficient documentation

## 2019-09-20 DIAGNOSIS — R079 Chest pain, unspecified: Secondary | ICD-10-CM | POA: Diagnosis present

## 2019-09-20 LAB — CBC
HCT: 29.5 % — ABNORMAL LOW (ref 36.0–46.0)
Hemoglobin: 8.2 g/dL — ABNORMAL LOW (ref 12.0–15.0)
MCH: 16.6 pg — ABNORMAL LOW (ref 26.0–34.0)
MCHC: 27.8 g/dL — ABNORMAL LOW (ref 30.0–36.0)
MCV: 59.6 fL — ABNORMAL LOW (ref 80.0–100.0)
Platelets: 131 10*3/uL — ABNORMAL LOW (ref 150–400)
RBC: 4.95 MIL/uL (ref 3.87–5.11)
RDW: 21.3 % — ABNORMAL HIGH (ref 11.5–15.5)
WBC: 4.9 10*3/uL (ref 4.0–10.5)
nRBC: 0 % (ref 0.0–0.2)

## 2019-09-20 LAB — BASIC METABOLIC PANEL
Anion gap: 8 (ref 5–15)
BUN: 11 mg/dL (ref 6–20)
CO2: 22 mmol/L (ref 22–32)
Calcium: 9 mg/dL (ref 8.9–10.3)
Chloride: 107 mmol/L (ref 98–111)
Creatinine, Ser: 0.63 mg/dL (ref 0.44–1.00)
GFR calc Af Amer: >60 mL/min (ref >60)
GFR calc non Af Amer: >60 mL/min (ref >60)
Glucose, Bld: 106 mg/dL — ABNORMAL HIGH (ref 70–99)
Potassium: 3.9 mmol/L (ref 3.5–5.1)
Sodium: 137 mmol/L (ref 135–145)

## 2019-09-20 LAB — TROPONIN I (HIGH SENSITIVITY): Troponin I (High Sensitivity): 3 ng/L (ref <18)

## 2019-09-20 MED ORDER — SODIUM CHLORIDE 0.9% FLUSH: 3.0000 mL | Freq: Once | INTRAVENOUS | Status: DC

## 2019-09-20 NOTE — ED Notes (Signed)
Pt ambullting outside to check on her grandson. Advised pt to come right back.

## 2019-09-20 NOTE — ED Triage Notes (Signed)
Patient reports having chest pain that started during the night.

## 2019-09-21 ENCOUNTER — Encounter: Payer: Self-pay | Admitting: Radiology

## 2019-09-21 ENCOUNTER — Emergency Department: Payer: Medicare Other

## 2019-09-21 LAB — HEPATIC FUNCTION PANEL
ALT: 36 U/L (ref 0–44)
AST: 55 U/L — ABNORMAL HIGH (ref 15–41)
Albumin: 3.5 g/dL (ref 3.5–5.0)
Alkaline Phosphatase: 121 U/L (ref 38–126)
Bilirubin, Direct: 0.2 mg/dL (ref 0.0–0.2)
Indirect Bilirubin: 0.5 mg/dL (ref 0.3–0.9)
Total Bilirubin: 0.7 mg/dL (ref 0.3–1.2)
Total Protein: 7.5 g/dL (ref 6.5–8.1)

## 2019-09-21 LAB — LIPASE, BLOOD: Lipase: 55 U/L — ABNORMAL HIGH (ref 11–51)

## 2019-09-21 LAB — TROPONIN I (HIGH SENSITIVITY): Troponin I (High Sensitivity): 4 ng/L (ref <18)

## 2019-09-21 MED ORDER — PREGABALIN 75 MG PO CAPS
300.0000 mg | ORAL_CAPSULE | Freq: Once | ORAL | Status: AC
  Administered 2019-09-21: 300 mg via ORAL
  Filled 2019-09-21: qty 4

## 2019-09-21 MED ORDER — FENTANYL CITRATE (PF) 100 MCG/2ML IJ SOLN
50.0000 ug | Freq: Once | INTRAMUSCULAR | Status: AC
  Administered 2019-09-21: 50 ug via INTRAVENOUS
  Filled 2019-09-21: qty 2

## 2019-09-21 MED ORDER — PREGABALIN 300 MG PO CAPS
300.0000 mg | ORAL_CAPSULE | Freq: Two times a day (BID) | ORAL | 0 refills | Status: DC
Start: 2019-09-21 — End: 2022-01-24

## 2019-09-21 MED ORDER — IOHEXOL 350 MG/ML SOLN
100.0000 mL | Freq: Once | INTRAVENOUS | Status: AC | PRN
  Administered 2019-09-21: 100 mL via INTRAVENOUS

## 2019-09-21 MED ORDER — ONDANSETRON HCL 4 MG/2ML IJ SOLN
4.0000 mg | Freq: Once | INTRAMUSCULAR | Status: AC
  Administered 2019-09-21: 4 mg via INTRAVENOUS
  Filled 2019-09-21: qty 2

## 2019-09-21 NOTE — ED Provider Notes (Signed)
[facility] Emergency Department Provider Note  ____________________________________________  Time seen: Approximately 12:55 AM  I have reviewed the triage vital signs and the nursing notes.   HISTORY  Chief Complaint Chest Pain   HPI Meghan Jimenez is a 59 y.o. female with a history of CAD status post CABG, CHF, cirrhosis, COPD, diabetes, hepatitis C, hypertension who presents for evaluation of chest pain. 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 For the last 24 hours she reports a new acute pain that she describes as a stabbing sharp pain that starts on the left side of her chest radiates to her back and down her abdomen. The pain usually lasts 5 to 6 minutes and resolve without intervention. The pain is severe. No cough. She has had intermittent wheezing which is normal for her. No shortness of breath, no leg pain or swelling, no hemoptysis, no prior history of PE or DVT. No fever or chills. Patient denies any paresthesias, numbness or weakness of her extremities. Has been having normal bowel movements. No constipation or diarrhea. Has had nausea but no vomiting.  Patient reports that she usually takes Lyrica for her chest pain however she ran out of it and she is unable to have it filled until the fourth.  Past Medical History:  Diagnosis Date  . CAD (coronary artery disease)   . CHF (congestive heart failure) (HCC)   . Cirrhosis (HCC)   . COPD (chronic obstructive pulmonary disease) (HCC)   . Diabetes mellitus without complication (HCC)   . Hepatitis C   . Hypertension     Patient Active Problem List   Diagnosis Date Noted  . Chest pain 11/12/2016  . PTSD (post-traumatic stress disorder) 12/13/2015  . Dysthymia 12/13/2015  . GIB (gastrointestinal bleeding) 12/10/2015  . Unstable angina (HCC) 11/11/2015    Past Surgical History:  Procedure Laterality Date  . BACK SURGERY    . CARDIAC SURGERY    .  ESOPHAGOGASTRODUODENOSCOPY (EGD) WITH PROPOFOL N/A 12/11/2015   Procedure: ESOPHAGOGASTRODUODENOSCOPY (EGD) WITH PROPOFOL;  Surgeon: [name], MD;  Location: [facility] ENDOSCOPY;  Service: Gastroenterology;  Laterality: N/A;  . LEFT HEART CATH AND CORONARY ANGIOGRAPHY N/A 11/13/2016   Procedure: Left Heart Cath and Coronary Angiography;  Surgeon: [name], MD;  Location: [facility] INVASIVE CV LAB;  Service: Cardiovascular;  Laterality: N/A;    Prior to Admission medications   Medication Sig Start Date End Date Taking? Authorizing Provider  aspirin EC 81 MG tablet Take 81 mg by mouth daily.    [provider]  atorvastatin (LIPITOR) 40 MG tablet Take 40 mg by mouth daily. 11/27/16   [provider]  ceFAZolin (ANCEF) 1 g injection 2 g by Other route every 8 (eight) hours.  01/23/17   [provider]  diphenhydrAMINE (BENADRYL) 50 MG capsule Take 50 mg by mouth every 6 (six) hours as needed.    [provider]  gabapentin (NEURONTIN) 300 MG capsule Take 1 capsule (300 mg total) by mouth 3 (three) times daily. Patient not taking: Reported on 03/01/2017 12/14/15   Auburn Bilberry, MD  hydrOXYzine (ATARAX/VISTARIL) 10 MG tablet Take 1 tablet (10 mg total) by mouth 3 (three) times daily as needed. Patient not taking: Reported on 03/01/2017 12/14/15   Auburn Bilberry, MD  metoprolol succinate (TOPROL-XL) 25 MG 24 hr tablet Take 12.5 mg by mouth daily.    [provider]  Multiple Vitamin (MULTIVITAMIN WITH MINERALS) TABS tablet Take 1 tablet by  mouth daily.    [provider]  oxyCODONE (OXY IR/ROXICODONE) 5 MG immediate release tablet Take 1 tablet by mouth 4 (four) times daily as needed. 11/27/16   [provider]  pantoprazole (PROTONIX) 40 MG tablet Take 40 mg by mouth 2 (two) times daily before a meal.    [provider]  potassium chloride SA (KLOR-CON M20) 20 MEQ tablet Take 40 mEq by mouth daily.    [provider]   pregabalin (LYRICA) 300 MG capsule Take 1 capsule (300 mg total) by mouth 2 (two) times daily for 5 days. 09/21/19 09/26/19  Nita SickleVeronese, [place], MD  prochlorperazine (COMPAZINE) 10 MG tablet Take 1 tablet (10 mg total) by mouth every 6 (six) hours as needed for nausea (headache). 12/25/18   Irean HongSung, Jade J, MD  spironolactone (ALDACTONE) 25 MG tablet Take 50 mg by mouth daily.     [provider]  traZODone (DESYREL) 100 MG tablet Take 1 tablet (100 mg total) by mouth at bedtime. 12/14/15   Auburn BilberryPatel, Shreyang, MD    Allergies Aspirin, Tramadol, Acetaminophen, Latex, Codeine, and Vicodin [hydrocodone-acetaminophen]  Family History  Problem Relation Age of Onset  . CAD Mother   . Diabetes Mother   . Lung cancer Father     Social History Social History   Tobacco Use  . Smoking status: Current Every Day Smoker    Packs/day: 1.00    Types: Cigarettes  . Smokeless tobacco: Never Used  . Tobacco comment: SMOKES 3-4 PKS/DAY  Substance Use Topics  . Alcohol use: Yes    Comment: Drinks occasionally every week  . Drug use: Yes    Types: "Crack" cocaine    Review of Systems  Constitutional: Negative for fever. Eyes: Negative for visual changes. ENT: Negative for sore throat. Neck: No neck pain  Cardiovascular: + chest pain. Respiratory: Negative for shortness of breath. Gastrointestinal: Negative for abdominal pain, vomiting or diarrhea. + nausea Genitourinary: Negative for dysuria. Musculoskeletal: Negative for back pain. Skin: Negative for rash. Neurological: Negative for headaches, weakness or numbness. Psych: No SI or HI  ____________________________________________   PHYSICAL EXAM:  VITAL SIGNS: ED Triage Vitals  Enc Vitals Group     BP 09/21/19 0034 (!) 119/91     Pulse Rate 09/21/19 0034 79     Resp 09/21/19 0034 16     Temp --      Temp src --      SpO2 09/21/19 0034 99 %     Weight 09/20/19 2048 162 lb (73.5 kg)     Height 09/20/19 2048 5\' 4"  (1.626 m)      Head Circumference --      Peak Flow --      Pain Score 09/20/19 2048 9     Pain Loc --      Pain Edu? --      Excl. in GC? --     Constitutional: Alert and oriented. Well appearing and in no apparent distress. HEENT:      Head: Normocephalic and atraumatic.         Eyes: Conjunctivae are normal. Sclera is non-icteric.       Mouth/Throat: Mucous membranes are moist.       Neck: Supple with no signs of meningismus. Cardiovascular: Regular rate and rhythm. No murmurs, gallops, or rubs. 2+ symmetrical distal pulses are present in all extremities. No JVD. Respiratory: Normal respiratory effort. Lungs are clear to auscultation bilaterally. No wheezes, crackles, or rhonchi.  Gastrointestinal: Soft, non tender, and  non distended with positive bowel sounds. No rebound or guarding. Genitourinary: No CVA tenderness. Musculoskeletal: Nontender with normal range of motion in all extremities. No edema, cyanosis, or erythema of extremities. Neurologic: Normal speech and language. Face is symmetric. Moving all extremities. No gross focal neurologic deficits are appreciated. Skin: Skin is warm, dry and intact. No rash noted. Psychiatric: Mood and affect are normal. Speech and behavior are normal.  ____________________________________________   LABS (all labs ordered are listed, but only abnormal results are displayed)  Labs Reviewed  BASIC METABOLIC PANEL - Abnormal; Notable for the following components:      Result Value   Glucose, Bld 106 (*)    All other components within normal limits  CBC - Abnormal; Notable for the following components:   Hemoglobin 8.2 (*)    HCT 29.5 (*)    MCV 59.6 (*)    MCH 16.6 (*)    MCHC 27.8 (*)    RDW 21.3 (*)    Platelets 131 (*)    All other components within normal limits  LIPASE, BLOOD - Abnormal; Notable for the following components:   Lipase 55 (*)    All other components within normal limits  HEPATIC FUNCTION PANEL - Abnormal; Notable for the  following components:   AST 55 (*)    All other components within normal limits  POC URINE PREG, ED  TROPONIN I (HIGH SENSITIVITY)  TROPONIN I (HIGH SENSITIVITY)   ____________________________________________  EKG  ED ECG REPORT I, Nita Sickle, the attending physician, personally viewed and interpreted this ECG.  Normal sinus rhythm, rate of 79, right bundle branch block, no ST elevations or depressions. Unchanged from prior. ____________________________________________  RADIOLOGY  I have personally reviewed the images performed during this visit and I agree with the Radiologist's read.   Interpretation by Radiologist:  DG Chest 2 View  Result Date: 09/20/2019 CLINICAL DATA:  Chest pain. EXAM: CHEST - 2 VIEW COMPARISON:  None. FINDINGS: Very mild, chronic appearing increased lung markings are seen. Very mild atelectasis is noted within the mid to lower right lung. Radiopaque surgical clips are seen overlying the left hilum and superior mediastinum on the left. There is no evidence of a pleural effusion or pneumothorax. The heart size and mediastinal contours are within normal limits. Surgical clips are seen overlying the left upper quadrant. A radiopaque fusion plate and screws are seen overlying the lower cervical spine. IMPRESSION: No active cardiopulmonary disease. Electronically Signed   By: Aram Candela M.D.   On: 09/20/2019 21:27   CT Angio Chest/Abd/Pel for Dissection W and/or Wo Contrast  Result Date: 09/21/2019 CLINICAL DATA:  Chest pain. EXAM: CT ANGIOGRAPHY CHEST, ABDOMEN AND PELVIS TECHNIQUE: Non-contrast CT of the chest was initially obtained. Multidetector CT imaging through the chest, abdomen and pelvis was performed using the standard protocol during bolus administration of intravenous contrast. Multiplanar reconstructed images and MIPs were obtained and reviewed to evaluate the vascular anatomy. CONTRAST:  OMNIPAQUE IOHEXOL 350 MG/ML SOLN COMPARISON:   April 29, 2019. FINDINGS: CTA CHEST FINDINGS Cardiovascular: There is no evidence for a thoracic aortic dissection or aneurysm. There are atherosclerotic changes of the thoracic aorta. The heart size is relatively normal. There are coronary artery calcifications. The patient is status post prior median sternotomy. The arch vessels are patent where visualized. There is no large centrally located pulmonary embolism. Mediastinum/Nodes: --No mediastinal or hilar lymphadenopathy. --No axillary lymphadenopathy. --No supraclavicular lymphadenopathy. --Normal thyroid gland. --The esophagus is unremarkable Lungs/Pleura: No pulmonary nodules or masses.  No pleural effusion or pneumothorax. No focal airspace consolidation. No focal pleural abnormality. Musculoskeletal: The patient is status post prior median sternotomy. There is stable nonunion of the patient's sternotomy herniation pericardial fat the defect. There is no evidence for an acute displaced fracture. Review of the MIP images confirms the above findings. CTA ABDOMEN AND PELVIS FINDINGS VASCULAR Aorta: Normal caliber aorta without aneurysm, dissection, vasculitis or significant stenosis. Celiac: Patent without evidence of aneurysm, dissection, vasculitis or significant stenosis. SMA: Patent without evidence of aneurysm, dissection, vasculitis or significant stenosis. Renals: Both renal arteries are patent without evidence of aneurysm, dissection, vasculitis, fibromuscular dysplasia or significant stenosis. IMA: Patent without evidence of aneurysm, dissection, vasculitis or significant stenosis. Inflow: Patent without evidence of aneurysm, dissection, vasculitis or significant stenosis. Veins: No obvious venous abnormality within the limitations of this arterial phase study. Review of the MIP images confirms the above findings. NON-VASCULAR Hepatobiliary: Liver is cirrhotic. There is no discrete hepatic mass. There is a tiny stable cyst in the left hepatic lobe.  Status post cholecystectomy.There is no biliary ductal dilation. Pancreas: Normal contours without ductal dilatation. No peripancreatic fluid collection. Spleen: Spleen is enlarged. A splenorenal shunt noted. Adrenals/Urinary Tract: --Adrenal glands: Unremarkable. --Right kidney/ureter: No hydronephrosis or radiopaque kidney stones. --Left kidney/ureter: No hydronephrosis or radiopaque kidney stones. --Urinary bladder: Unremarkable. Stomach/Bowel: --Stomach/Duodenum: No hiatal hernia or other gastric abnormality. Normal duodenal course and caliber. --Small bowel: Unremarkable. --Colon: Unremarkable. --Appendix: Normal. Lymphatic: --No retroperitoneal lymphadenopathy. --No mesenteric lymphadenopathy. --No pelvic or inguinal lymphadenopathy. Reproductive: Unremarkable Other: There is a trace amount of free fluid in the patient's abdomen pelvis. The abdominal wall is normal. Musculoskeletal. No acute displaced fractures. Review of the MIP images confirms the above findings. IMPRESSION: 1. No evidence for aortic dissection or aneurysm. 2. No acute findings in the chest, abdomen, or pelvis. 3. Cirrhosis with stigmata of portal hypertension. Trace abdominal ascites. Aortic Atherosclerosis (ICD10-I70.0). Electronically Signed   By: Constance Holster M.D.   On: 09/21/2019 02:04     ____________________________________________   PROCEDURES  Procedure(s) performed:yes .1-3 Lead EKG Interpretation Performed by: Rudene Re, MD Authorized by: Rudene Re, MD     Interpretation: normal     ECG rate assessment: normal     Ectopy: none     Conduction: normal     Critical Care performed: yes  CRITICAL CARE Performed by: Rudene Re  ?  Total critical care time: 35 min  Critical care time was exclusive of separately billable procedures and treating other patients.  Critical care was necessary to treat or prevent imminent or life-threatening deterioration.  Critical care was time  spent personally by me on the following activities: development of treatment plan with patient and/or surrogate as well as nursing, discussions with consultants, evaluation of patient's response to treatment, examination of patient, obtaining history from patient or surrogate, ordering and performing treatments and interventions, ordering and review of laboratory studies, ordering and review of radiographic studies, pulse oximetry and re-evaluation of patient's condition.  ____________________________________________   INITIAL IMPRESSION / ASSESSMENT AND PLAN / ED COURSE   59 y.o. female with a history of CAD status post CABG, CHF, cirrhosis, COPD, diabetes, hepatitis C, hypertension who presents for evaluation of acute on chronic chest pain. Patient is well-appearing and in no distress, normal vital signs with no hypertension, normal work of breathing, normal sats, lungs are clear to auscultation, abdomen is soft and nontender and nondistended.  EKG unchanged from baseline. Patient placed on telemetry for close monitoring. Old medical records  reviewed.  Ddx dissection, PE, GERD, acute exacerbation of chronic pain, costochondritis, pneumonia, myocarditis, pericarditis.  Plan for labs, CTA. Will give fentanyl and Zofran for symptoms. Chest x-ray visualized by me with no acute findings. Confirmed by radiology.  _________________________ 2:36 AM on 09/21/2019 -----------------------------------------  High-sensitivity troponin x2 -.  CT of the chest with no acute findings.  Most likely exacerbation of her chronic chest pain since patient has run out of her Lyrica.  She is asking for a prescription until she can get it filled on the fourth.  I review [place] controlled substance database and patient last filled the prescription on March 4.  I will provide her with 7-day prescription and recommended follow-up with PCP for further management of her pain and prescriptions.  Discussed my standard return  precautions    _____________________________________________ Please note:  Patient was evaluated in Emergency Department today for the symptoms described in the history of present illness. Patient was evaluated in the context of the global COVID-19 pandemic, which necessitated consideration that the patient might be at risk for infection with the SARS-CoV-2 virus that causes COVID-19. Institutional protocols and algorithms that pertain to the evaluation of patients at risk for COVID-19 are in a state of rapid change based on information released by regulatory bodies including the CDC and federal and state organizations. These policies and algorithms were followed during the patient's care in the ED.  Some ED evaluations and interventions may be delayed as a result of limited staffing during the pandemic.   [place] Controlled Substance Database was reviewed by me. ____________________________________________   FINAL CLINICAL IMPRESSION(S) / ED DIAGNOSES   Final diagnoses:  Atypical chest pain  Chronic chest pain      NEW MEDICATIONS STARTED DURING THIS VISIT:  ED Discharge Orders         Ordered    pregabalin (LYRICA) 300 MG capsule  2 times daily     09/21/19 0234           Note:  This document was prepared using Dragon voice recognition software and may include unintentional dictation errors.    Don Perking, [place], MD 09/21/19 [phone]

## 2019-09-21 NOTE — ED Notes (Signed)
Pt ambulated to toilet with standby assist 

## 2019-10-28 ENCOUNTER — Emergency Department
Admission: EM | Admit: 2019-10-28 | Discharge: 2019-10-28 | Disposition: A | Discharge status: Home / Self Care | Payer: Medicare Other | Attending: Emergency Medicine | Admitting: Emergency Medicine

## 2019-10-28 ENCOUNTER — Emergency Department: Payer: Medicare Other

## 2019-10-28 ENCOUNTER — Other Ambulatory Visit: Payer: Self-pay

## 2019-10-28 DIAGNOSIS — J449 Chronic obstructive pulmonary disease, unspecified: Secondary | ICD-10-CM | POA: Insufficient documentation

## 2019-10-28 DIAGNOSIS — I509 Heart failure, unspecified: Secondary | ICD-10-CM | POA: Diagnosis not present

## 2019-10-28 DIAGNOSIS — R5383 Other fatigue: Secondary | ICD-10-CM | POA: Diagnosis not present

## 2019-10-28 DIAGNOSIS — Z79899 Other long term (current) drug therapy: Secondary | ICD-10-CM | POA: Diagnosis not present

## 2019-10-28 DIAGNOSIS — Z91040 Latex allergy status: Secondary | ICD-10-CM | POA: Diagnosis not present

## 2019-10-28 DIAGNOSIS — M791 Myalgia, unspecified site: Secondary | ICD-10-CM | POA: Diagnosis not present

## 2019-10-28 DIAGNOSIS — E119 Type 2 diabetes mellitus without complications: Secondary | ICD-10-CM | POA: Insufficient documentation

## 2019-10-28 DIAGNOSIS — F17210 Nicotine dependence, cigarettes, uncomplicated: Secondary | ICD-10-CM | POA: Diagnosis not present

## 2019-10-28 DIAGNOSIS — I2510 Atherosclerotic heart disease of native coronary artery without angina pectoris: Secondary | ICD-10-CM | POA: Diagnosis not present

## 2019-10-28 DIAGNOSIS — R531 Weakness: Secondary | ICD-10-CM | POA: Diagnosis present

## 2019-10-28 DIAGNOSIS — I110 Hypertensive heart disease with heart failure: Secondary | ICD-10-CM | POA: Diagnosis not present

## 2019-10-28 DIAGNOSIS — Z7982 Long term (current) use of aspirin: Secondary | ICD-10-CM | POA: Diagnosis not present

## 2019-10-28 DIAGNOSIS — R6883 Chills (without fever): Secondary | ICD-10-CM | POA: Diagnosis not present

## 2019-10-28 DIAGNOSIS — T50Z95A Adverse effect of other vaccines and biological substances, initial encounter: Secondary | ICD-10-CM

## 2019-10-28 LAB — CBC
HCT: 32.8 % — ABNORMAL LOW (ref 36.0–46.0)
Hemoglobin: 9.9 g/dL — ABNORMAL LOW (ref 12.0–15.0)
MCH: 20.7 pg — ABNORMAL LOW (ref 26.0–34.0)
MCHC: 30.2 g/dL (ref 30.0–36.0)
MCV: 68.6 fL — ABNORMAL LOW (ref 80.0–100.0)
Platelets: 132 10*3/uL — ABNORMAL LOW (ref 150–400)
RBC: 4.78 MIL/uL (ref 3.87–5.11)
RDW: 25.3 % — ABNORMAL HIGH (ref 11.5–15.5)
WBC: 4.7 10*3/uL (ref 4.0–10.5)
nRBC: 0 % (ref 0.0–0.2)

## 2019-10-28 LAB — BASIC METABOLIC PANEL
Anion gap: 8 (ref 5–15)
BUN: 13 mg/dL (ref 6–20)
CO2: 26 mmol/L (ref 22–32)
Calcium: 8.5 mg/dL — ABNORMAL LOW (ref 8.9–10.3)
Chloride: 103 mmol/L (ref 98–111)
Creatinine, Ser: 0.69 mg/dL (ref 0.44–1.00)
GFR calc Af Amer: >60 mL/min (ref >60)
GFR calc non Af Amer: >60 mL/min (ref >60)
Glucose, Bld: 94 mg/dL (ref 70–99)
Potassium: 3.8 mmol/L (ref 3.5–5.1)
Sodium: 137 mmol/L (ref 135–145)

## 2019-10-28 LAB — TROPONIN I (HIGH SENSITIVITY): Troponin I (High Sensitivity): 6 ng/L (ref <18)

## 2019-10-28 MED ORDER — NAPROXEN 500 MG PO TABS
500.0000 mg | ORAL_TABLET | Freq: Two times a day (BID) | ORAL | 0 refills | Status: AC
Start: 2019-10-28 — End: 2019-10-31

## 2019-10-28 MED ORDER — NAPROXEN 500 MG PO TABS
500.0000 mg | ORAL_TABLET | Freq: Once | ORAL | Status: AC
  Administered 2019-10-28: 500 mg via ORAL
  Filled 2019-10-28: qty 1

## 2019-10-28 MED ORDER — ONDANSETRON 4 MG PO TBDP
4.0000 mg | ORAL_TABLET | Freq: Three times a day (TID) | ORAL | 0 refills | Status: DC | PRN
Start: 2019-10-28 — End: 2020-12-10

## 2019-10-28 NOTE — ED Provider Notes (Signed)
[facility] Emergency Department Provider Note  ____________________________________________  Time seen: Approximately 10:33 PM  I have reviewed the triage vital signs and the nursing notes.   HISTORY  Chief Complaint Weakness    HPI Meghan Jimenez is a 59 y.o. female with a history of CAD CHF cirrhosis COPD diabetes hypertension who comes ED complaining of fatigue and body aches, chills  and bilateral frontal headache, gradual onset, waxing waning and constant over the past 3 days ever since getting her second Covid vaccine.  Denies cough or shortness of breath, no chest pain dizziness or syncope.  She is eating and drinking normally.  No diarrhea or vomiting.     Past Medical History:  Diagnosis Date  . CAD (coronary artery disease)   . CHF (congestive heart failure) (HCC)   . Cirrhosis (HCC)   . COPD (chronic obstructive pulmonary disease) (HCC)   . Diabetes mellitus without complication (HCC)   . Hepatitis C   . Hypertension      Patient Active Problem List   Diagnosis Date Noted  . Chest pain 11/12/2016  . PTSD (post-traumatic stress disorder) 12/13/2015  . Dysthymia 12/13/2015  . GIB (gastrointestinal bleeding) 12/10/2015  . Unstable angina (HCC) 11/11/2015     Past Surgical History:  Procedure Laterality Date  . BACK SURGERY    . CARDIAC SURGERY    . ESOPHAGOGASTRODUODENOSCOPY (EGD) WITH PROPOFOL N/A 12/11/2015   Procedure: ESOPHAGOGASTRODUODENOSCOPY (EGD) WITH PROPOFOL;  Surgeon: [name], MD;  Location: [facility] ENDOSCOPY;  Service: Gastroenterology;  Laterality: N/A;  . LEFT HEART CATH AND CORONARY ANGIOGRAPHY N/A 11/13/2016   Procedure: Left Heart Cath and Coronary Angiography;  Surgeon: [name], MD;  Location: [facility] INVASIVE CV LAB;  Service: Cardiovascular;  Laterality: N/A;     Prior to Admission medications   Medication Sig Start Date End Date Taking? Authorizing Provider  aspirin EC 81 MG tablet Take 81 mg by  mouth daily.    [provider]  atorvastatin (LIPITOR) 40 MG tablet Take 40 mg by mouth daily. 11/27/16   [provider]  ceFAZolin (ANCEF) 1 g injection 2 g by Other route every 8 (eight) hours.  01/23/17   [provider]  diphenhydrAMINE (BENADRYL) 50 MG capsule Take 50 mg by mouth every 6 (six) hours as needed.    [provider]  gabapentin (NEURONTIN) 300 MG capsule Take 1 capsule (300 mg total) by mouth 3 (three) times daily. Patient not taking: Reported on 03/01/2017 12/14/15   Auburn Bilberry, MD  hydrOXYzine (ATARAX/VISTARIL) 10 MG tablet Take 1 tablet (10 mg total) by mouth 3 (three) times daily as needed. Patient not taking: Reported on 03/01/2017 12/14/15   Auburn Bilberry, MD  metoprolol succinate (TOPROL-XL) 25 MG 24 hr tablet Take 12.5 mg by mouth daily.    [provider]  Multiple Vitamin (MULTIVITAMIN WITH MINERALS) TABS tablet Take 1 tablet by mouth daily.    [provider]  naproxen (NAPROSYN) 500 MG tablet Take 1 tablet (500 mg total) by mouth 2 (two) times daily with a meal for 3 days. 10/28/19 10/31/19  Sharman Cheek, MD  ondansetron (ZOFRAN ODT) 4 MG disintegrating tablet Take 1 tablet (4 mg total) by mouth every 8 (eight) hours as needed for nausea or vomiting. 10/28/19   Sharman Cheek, MD  oxyCODONE (OXY IR/ROXICODONE) 5 MG immediate release tablet Take 1 tablet by mouth 4 (four) times daily as needed. 11/27/16   [provider]  pantoprazole (PROTONIX) 40 MG tablet  Take 40 mg by mouth 2 (two) times daily before a meal.    [provider]  potassium chloride SA (KLOR-CON M20) 20 MEQ tablet Take 40 mEq by mouth daily.    [provider]  pregabalin (LYRICA) 300 MG capsule Take 1 capsule (300 mg total) by mouth 2 (two) times daily for 5 days. 09/21/19 09/26/19  Nita SickleVeronese, [place], MD  prochlorperazine (COMPAZINE) 10 MG tablet Take 1 tablet (10 mg total) by mouth every 6 (six) hours as needed for  nausea (headache). 12/25/18   Irean HongSung, Jade J, MD  spironolactone (ALDACTONE) 25 MG tablet Take 50 mg by mouth daily.     [provider]  traZODone (DESYREL) 100 MG tablet Take 1 tablet (100 mg total) by mouth at bedtime. 12/14/15   Auburn BilberryPatel, Shreyang, MD     Allergies Aspirin, Tramadol, Acetaminophen, Latex, Codeine, and Vicodin [hydrocodone-acetaminophen]   Family History  Problem Relation Age of Onset  . CAD Mother   . Diabetes Mother   . Lung cancer Father     Social History Social History   Tobacco Use  . Smoking status: Current Every Day Smoker    Packs/day: 1.00    Types: Cigarettes  . Smokeless tobacco: Never Used  . Tobacco comment: SMOKES 3-4 PKS/DAY  Substance Use Topics  . Alcohol use: Yes    Comment: Drinks occasionally every week  . Drug use: Yes    Types: "Crack" cocaine    Review of Systems  Constitutional:   No fever positive chills.  Positive malaise, fatigue, body aches ENT:   No sore throat. No rhinorrhea. Cardiovascular:   No chest pain or syncope. Respiratory:   No dyspnea or cough. Gastrointestinal:   Negative for abdominal pain, vomiting and diarrhea.  Musculoskeletal:   Negative for focal pain or swelling All other systems reviewed and are negative except as documented above in ROS and HPI.  ____________________________________________   PHYSICAL EXAM:  VITAL SIGNS: ED Triage Vitals  Enc Vitals Group     BP 10/28/19 1749 (!) 144/71     Pulse Rate 10/28/19 1749 80     Resp 10/28/19 1749 18     Temp 10/28/19 1749 99 F (37.2 C)     Temp Source 10/28/19 1749 Oral     SpO2 10/28/19 1749 97 %     Weight 10/28/19 1744 167 lb (75.8 kg)     Height 10/28/19 1744 5\' 4"  (1.626 m)     Head Circumference --      Peak Flow --      Pain Score 10/28/19 1744 9     Pain Loc --      Pain Edu? --      Excl. in GC? --     Vital signs reviewed, nursing assessments reviewed.   Constitutional:   Alert and oriented. Non-toxic appearance. Eyes:    Conjunctivae are normal. EOMI. PERRL. ENT      Head:   Normocephalic and atraumatic.      Nose:   Wearing a mask.      Mouth/Throat:   Wearing a mask.      Neck:   No meningismus. Full ROM. Hematological/Lymphatic/Immunilogical:   No cervical lymphadenopathy. Cardiovascular:   RRR. Symmetric bilateral radial and DP pulses.  No murmurs. Cap refill less than 2 seconds. Respiratory:   Normal respiratory effort without tachypnea/retractions. Breath sounds are clear and equal bilaterally. No wheezes/rales/rhonchi. Gastrointestinal:   Soft and nontender. Non distended. There is no CVA tenderness.  No rebound,  rigidity, or guarding. Genitourinary:   deferred Musculoskeletal:   Normal range of motion in all extremities. No joint effusions.  No lower extremity tenderness.  No edema. Neurologic:   Normal speech and language.  Motor grossly intact. No acute focal neurologic deficits are appreciated.  Skin:    Skin is warm, dry and intact. No rash noted.  No petechiae, purpura, or bullae.  ____________________________________________    LABS (pertinent positives/negatives) (all labs ordered are listed, but only abnormal results are displayed) Labs Reviewed  BASIC METABOLIC PANEL - Abnormal; Notable for the following components:      Result Value   Calcium 8.5 (*)    All other components within normal limits  CBC - Abnormal; Notable for the following components:   Hemoglobin 9.9 (*)    HCT 32.8 (*)    MCV 68.6 (*)    MCH 20.7 (*)    RDW 25.3 (*)    Platelets 132 (*)    All other components within normal limits  URINALYSIS, COMPLETE (UACMP) WITH MICROSCOPIC  TROPONIN I (HIGH SENSITIVITY)   ____________________________________________   EKG Interpreted by me Normal sinus rhythm rate of 76, normal axis and intervals.  Right bundle branch block with T wave inversion in V2 and V3.  No acute ST changes.  Not significantly changed compared to previous EKG Sep 22, 2019.   ____________________________________________    RADIOLOGY  DG Chest 2 View  Result Date: 10/28/2019 CLINICAL DATA:  Chest pain and fever EXAM: CHEST - 2 VIEW COMPARISON:  Sep 20, 2019 FINDINGS: Lungs are clear. Heart size and pulmonary vascularity are normal. No adenopathy. No pneumothorax. Old healed fracture left second rib. Postoperative changes noted over the left medial hemithorax. Postoperative change also noted in the lower cervical spine. IMPRESSION: Postoperative changes. Lungs clear. Cardiac silhouette normal. No evident adenopathy. Electronically Signed   By: Bretta Bang III M.D.   On: 10/28/2019 18:49    ____________________________________________   PROCEDURES Procedures  ____________________________________________  DIFFERENTIAL DIAGNOSIS   Electrolyte abnormality, dehydration, Covid vaccine side effect, UTI, pneumonia  CLINICAL IMPRESSION / ASSESSMENT AND PLAN / ED COURSE  Medications ordered in the ED: Medications  naproxen (NAPROSYN) tablet 500 mg (500 mg Oral Given 10/28/19 2203)    Pertinent labs & imaging results that were available during my care of the patient were reviewed by me and considered in my medical decision making (see chart for details).  Meghan Jimenez was evaluated in Emergency Department on 10/28/2019 for the symptoms described in the history of present illness. She was evaluated in the context of the global COVID-19 pandemic, which necessitated consideration that the patient might be at risk for infection with the SARS-CoV-2 virus that causes COVID-19. Institutional protocols and algorithms that pertain to the evaluation of patients at risk for COVID-19 are in a state of rapid change based on information released by regulatory bodies including the CDC and federal and state organizations. These policies and algorithms were followed during the patient's care in the ED.   Patient presents with multiple constitutional symptoms, but no  focal symptoms, reassuring exam.  Vital signs are normal, labs are normal chest x-ray unremarkable.  Troponin was sent as part of triage protocol, but his presentation is noncardiac and no cardiac work-up is indicated.  Symptoms are attributable to immune response to second Covid vaccine.  Recommend taking naproxen for the next few days to help with her symptoms, continue focusing on hydration.  Stable for discharge home.  ____________________________________________   FINAL CLINICAL IMPRESSION(S) / ED DIAGNOSES    Final diagnoses:  Fatigue, unspecified type  Vaccine reaction, initial encounter     ED Discharge Orders         Ordered    naproxen (NAPROSYN) 500 MG tablet  2 times daily with meals     Discontinue  Reprint     10/28/19 2233    ondansetron (ZOFRAN ODT) 4 MG disintegrating tablet  Every 8 hours PRN     Discontinue  Reprint     10/28/19 2233          Portions of this note were generated with dragon dictation software. Dictation errors may occur despite best attempts at proofreading.   Sharman Cheek, MD 10/28/19 2237

## 2019-10-28 NOTE — ED Notes (Signed)
See triage note, pt reports she has not been feeling well since receiving her second covid vaccine on Friday. Reports nausea, vomiting, body aches and over all "not feeling well"

## 2019-10-28 NOTE — ED Triage Notes (Signed)
Pt to the er via ems for weakness. Pt had 2nd covid shot on Friday, Pt son came back from getting food and states that pt calls ems as attention seeking. Pt son stated pt had no issues prior to him leaving. Pt in no acute distress. Vitals on truck 140/70, pulse 91, resp 18, blood sugar 156.

## 2019-10-28 NOTE — ED Triage Notes (Signed)
Pt states she has been sick since the covid shot on friday. Body aches, headache and fever. Pt states she took tylenol PTA.

## 2019-10-28 NOTE — ED Notes (Signed)
Once triage completed, pt states she wants to be checked out for her heart. Pt denies chest pain and then says she does have chest pain. Cardiac protocols done on side of caution.

## 2019-10-28 NOTE — ED Notes (Signed)
Pt ambulatory with cane in and out of lobby in NAD at this time.

## 2020-07-14 ENCOUNTER — Other Ambulatory Visit: Payer: Self-pay | Admitting: Obstetrics and Gynecology

## 2020-07-14 DIAGNOSIS — Z1211 Encounter for screening for malignant neoplasm of colon: Secondary | ICD-10-CM

## 2020-07-14 DIAGNOSIS — Z1231 Encounter for screening mammogram for malignant neoplasm of breast: Secondary | ICD-10-CM

## 2020-11-09 ENCOUNTER — Other Ambulatory Visit: Payer: Self-pay | Admitting: Obstetrics and Gynecology

## 2020-11-09 DIAGNOSIS — I10 Essential (primary) hypertension: Secondary | ICD-10-CM

## 2020-11-09 DIAGNOSIS — Z1231 Encounter for screening mammogram for malignant neoplasm of breast: Secondary | ICD-10-CM

## 2020-11-09 DIAGNOSIS — I2510 Atherosclerotic heart disease of native coronary artery without angina pectoris: Secondary | ICD-10-CM

## 2020-11-09 DIAGNOSIS — E039 Hypothyroidism, unspecified: Secondary | ICD-10-CM

## 2020-11-09 DIAGNOSIS — K7460 Unspecified cirrhosis of liver: Secondary | ICD-10-CM

## 2020-12-09 ENCOUNTER — Emergency Department: Payer: Medicare Other

## 2020-12-09 ENCOUNTER — Other Ambulatory Visit: Payer: Self-pay

## 2020-12-09 DIAGNOSIS — S060X0D Concussion without loss of consciousness, subsequent encounter: Secondary | ICD-10-CM | POA: Diagnosis not present

## 2020-12-09 DIAGNOSIS — J449 Chronic obstructive pulmonary disease, unspecified: Secondary | ICD-10-CM | POA: Insufficient documentation

## 2020-12-09 DIAGNOSIS — I110 Hypertensive heart disease with heart failure: Secondary | ICD-10-CM | POA: Diagnosis not present

## 2020-12-09 DIAGNOSIS — F0781 Postconcussional syndrome: Secondary | ICD-10-CM | POA: Diagnosis not present

## 2020-12-09 DIAGNOSIS — Z7901 Long term (current) use of anticoagulants: Secondary | ICD-10-CM | POA: Insufficient documentation

## 2020-12-09 DIAGNOSIS — Z91040 Latex allergy status: Secondary | ICD-10-CM | POA: Insufficient documentation

## 2020-12-09 DIAGNOSIS — I2510 Atherosclerotic heart disease of native coronary artery without angina pectoris: Secondary | ICD-10-CM | POA: Insufficient documentation

## 2020-12-09 DIAGNOSIS — W2209XD Striking against other stationary object, subsequent encounter: Secondary | ICD-10-CM | POA: Diagnosis not present

## 2020-12-09 DIAGNOSIS — F17210 Nicotine dependence, cigarettes, uncomplicated: Secondary | ICD-10-CM | POA: Diagnosis not present

## 2020-12-09 DIAGNOSIS — I509 Heart failure, unspecified: Secondary | ICD-10-CM | POA: Diagnosis not present

## 2020-12-09 DIAGNOSIS — Z7982 Long term (current) use of aspirin: Secondary | ICD-10-CM | POA: Diagnosis not present

## 2020-12-09 LAB — BASIC METABOLIC PANEL
Anion gap: 7 (ref 5–15)
BUN: 9 mg/dL (ref 8–23)
CO2: 25 mmol/L (ref 22–32)
Calcium: 8.7 mg/dL — ABNORMAL LOW (ref 8.9–10.3)
Chloride: 102 mmol/L (ref 98–111)
Creatinine, Ser: 0.56 mg/dL (ref 0.44–1.00)
GFR, Estimated: >60 mL/min (ref >60)
Glucose, Bld: 84 mg/dL (ref 70–99)
Potassium: 4 mmol/L (ref 3.5–5.1)
Sodium: 134 mmol/L — ABNORMAL LOW (ref 135–145)

## 2020-12-09 LAB — CBC
HCT: 32.8 % — ABNORMAL LOW (ref 36.0–46.0)
Hemoglobin: 10.3 g/dL — ABNORMAL LOW (ref 12.0–15.0)
MCH: 21.1 pg — ABNORMAL LOW (ref 26.0–34.0)
MCHC: 31.4 g/dL (ref 30.0–36.0)
MCV: 67.4 fL — ABNORMAL LOW (ref 80.0–100.0)
Platelets: 182 10*3/uL (ref 150–400)
RBC: 4.87 MIL/uL (ref 3.87–5.11)
RDW: 18.2 % — ABNORMAL HIGH (ref 11.5–15.5)
WBC: 6.6 10*3/uL (ref 4.0–10.5)
nRBC: 0 % (ref 0.0–0.2)

## 2020-12-09 LAB — TROPONIN I (HIGH SENSITIVITY): Troponin I (High Sensitivity): 7 ng/L (ref <18)

## 2020-12-09 NOTE — ED Triage Notes (Signed)
Pt states she had a head injury 2 weeks and 4 days ago by hitting her head on the freezer door when she stood up. Pt states her son cleaned her up and she has had a HA ever since. Pt states the top and back left of her head hurt. Pt states she feels a "pulling" in her eye ever since. Pt states she also has CP.

## 2020-12-10 ENCOUNTER — Emergency Department
Admission: EM | Admit: 2020-12-10 | Discharge: 2020-12-10 | Disposition: A | Discharge status: Home / Self Care | Payer: Medicare Other | Attending: Emergency Medicine | Admitting: Emergency Medicine

## 2020-12-10 DIAGNOSIS — F0781 Postconcussional syndrome: Secondary | ICD-10-CM

## 2020-12-10 LAB — TROPONIN I (HIGH SENSITIVITY): Troponin I (High Sensitivity): 7 ng/L (ref <18)

## 2020-12-10 MED ORDER — OXYCODONE-ACETAMINOPHEN 5-325 MG PO TABS: 1.0000 | ORAL_TABLET | ORAL | 0 refills | Status: DC | PRN

## 2020-12-10 MED ORDER — ONDANSETRON 4 MG PO TBDP: 4.0000 mg | ORAL_TABLET | Freq: Three times a day (TID) | ORAL | 0 refills | Status: DC | PRN

## 2020-12-10 MED ORDER — ONDANSETRON 4 MG PO TBDP
4.0000 mg | ORAL_TABLET | Freq: Once | ORAL | Status: AC
  Administered 2020-12-10: 4 mg via ORAL
  Filled 2020-12-10: qty 1

## 2020-12-10 MED ORDER — OXYCODONE-ACETAMINOPHEN 5-325 MG PO TABS
1.0000 | ORAL_TABLET | Freq: Once | ORAL | Status: AC
Start: 2020-12-10 — End: 2020-12-10
  Administered 2020-12-10: 1 via ORAL
  Filled 2020-12-10: qty 1

## 2020-12-10 NOTE — Discharge Instructions (Signed)
You may take Percocet and Zofran to take as needed for pain and nausea.  Return to the ER for worsening symptoms, persistent vomiting, lethargy or other concerns.

## 2020-12-10 NOTE — ED Provider Notes (Signed)
[facility] Emergency Department Provider Note   ____________________________________________   Event Date/Time   First MD Initiated Contact with Patient 12/10/20 0309     (approximate)  I have reviewed the triage vital signs and the nursing notes.   HISTORY  Chief Complaint Nausea, Head Injury, and Chest Pain    HPI Meghan Jimenez is a 60 y.o. female who presents to the ED from home with a chief complaint of headache and nausea since hitting her head 2 weeks and 4 days ago.  Patient takes Plavix.  She stood up and struck her head on the freezer door and suffered a scalp laceration.  She did not come to the ER because her son is a paramedic so he clean her up.  Complains of generalized headache since with a "pulling" sensation in her left eye.  Denies vision changes, blurry vision, photophobia.  Denies nausea, vomiting or dizziness.  Patient complains of chronic chest pain from heart surgery years ago.  This is not new or different than usual.     Past Medical History:  Diagnosis Date   CAD (coronary artery disease)    CHF (congestive heart failure) (HCC)    Cirrhosis (HCC)    COPD (chronic obstructive pulmonary disease) (HCC)    Diabetes mellitus without complication (HCC)    Hepatitis C    Hypertension     Patient Active Problem List   Diagnosis Date Noted   Chest pain 11/12/2016   PTSD (post-traumatic stress disorder) 12/13/2015   Dysthymia 12/13/2015   GIB (gastrointestinal bleeding) 12/10/2015   Unstable angina (HCC) 11/11/2015    Past Surgical History:  Procedure Laterality Date   BACK SURGERY     CARDIAC SURGERY     ESOPHAGOGASTRODUODENOSCOPY (EGD) WITH PROPOFOL N/A 12/11/2015   Procedure: ESOPHAGOGASTRODUODENOSCOPY (EGD) WITH PROPOFOL;  Surgeon: [name], MD;  Location: [facility] ENDOSCOPY;  Service: Gastroenterology;  Laterality: N/A;   LEFT HEART CATH AND CORONARY ANGIOGRAPHY N/A 11/13/2016   Procedure: Left Heart Cath and Coronary  Angiography;  Surgeon: [name], MD;  Location: [facility] INVASIVE CV LAB;  Service: Cardiovascular;  Laterality: N/A;    Prior to Admission medications   Medication Sig Start Date End Date Taking? Authorizing Provider  ondansetron (ZOFRAN ODT) 4 MG disintegrating tablet Take 1 tablet (4 mg total) by mouth every 8 (eight) hours as needed for nausea or vomiting. 12/10/20  Yes Irean Hong, MD  oxyCODONE-acetaminophen (PERCOCET/ROXICET) 5-325 MG tablet Take 1 tablet by mouth every 4 (four) hours as needed for severe pain. 12/10/20  Yes Irean Hong, MD  aspirin EC 81 MG tablet Take 81 mg by mouth daily.    [provider]  atorvastatin (LIPITOR) 40 MG tablet Take 40 mg by mouth daily. 11/27/16   [provider]  ceFAZolin (ANCEF) 1 g injection 2 g by Other route every 8 (eight) hours.  01/23/17   [provider]  diphenhydrAMINE (BENADRYL) 50 MG capsule Take 50 mg by mouth every 6 (six) hours as needed.    [provider]  gabapentin (NEURONTIN) 300 MG capsule Take 1 capsule (300 mg total) by mouth 3 (three) times daily. Patient not taking: Reported on 03/01/2017 12/14/15   Auburn Bilberry, MD  hydrOXYzine (ATARAX/VISTARIL) 10 MG tablet Take 1 tablet (10 mg total) by mouth 3 (three) times daily as needed. Patient not taking: Reported on 03/01/2017 12/14/15   Auburn Bilberry, MD  metoprolol succinate (TOPROL-XL) 25 MG 24 hr tablet Take 12.5 mg by  mouth daily.    [provider]  Multiple Vitamin (MULTIVITAMIN WITH MINERALS) TABS tablet Take 1 tablet by mouth daily.    [provider]  oxyCODONE (OXY IR/ROXICODONE) 5 MG immediate release tablet Take 1 tablet by mouth 4 (four) times daily as needed. 11/27/16   [provider]  pantoprazole (PROTONIX) 40 MG tablet Take 40 mg by mouth 2 (two) times daily before a meal.    [provider]  potassium chloride SA (KLOR-CON M20) 20 MEQ tablet Take 40 mEq by mouth daily.    [provider]  pregabalin (LYRICA) 300 MG capsule Take 1 capsule (300 mg total) by mouth 2 (two) times daily for 5 days. 09/21/19 09/26/19  Nita Sickle, MD  prochlorperazine (COMPAZINE) 10 MG tablet Take 1 tablet (10 mg total) by mouth every 6 (six) hours as needed for nausea (headache). 12/25/18   Irean Hong, MD  spironolactone (ALDACTONE) 25 MG tablet Take 50 mg by mouth daily.     [provider]  traZODone (DESYREL) 100 MG tablet Take 1 tablet (100 mg total) by mouth at bedtime. 12/14/15   Auburn Bilberry, MD    Allergies Aspirin, Tramadol, Acetaminophen, Latex, Codeine, and Vicodin [hydrocodone-acetaminophen]  Family History  Problem Relation Age of Onset   CAD Mother    Diabetes Mother    Lung cancer Father     Social History Social History   Tobacco Use   Smoking status: Every Day    Packs/day: 1.00    Types: Cigarettes   Smokeless tobacco: Never   Tobacco comments:    SMOKES 3-4 PKS/DAY  Substance Use Topics   Alcohol use: Yes    Comment: Drinks occasionally every week   Drug use: Yes    Types: "Crack" cocaine    Review of Systems  Constitutional: No fever/chills Eyes: No visual changes. ENT: No sore throat. Cardiovascular: Positive for chest pain. Respiratory: Denies shortness of breath. Gastrointestinal: No abdominal pain.  No nausea, no vomiting.  No diarrhea.  No constipation. Genitourinary: Negative for dysuria. Musculoskeletal: Negative for back pain. Skin: Negative for rash. Neurological: Positive for headache. Negative for focal weakness or numbness.   ____________________________________________   PHYSICAL EXAM:  VITAL SIGNS: ED Triage Vitals  Enc Vitals Group     BP 12/09/20 2248 (!) 173/85     Pulse Rate 12/09/20 2248 73     Resp 12/09/20 2248 20     Temp 12/09/20 2248 98.9 F (37.2 C)     Temp Source 12/09/20 2248 Oral     SpO2 12/09/20 2248 97 %     Weight 12/09/20 2253 185 lb (83.9 kg)     Height 12/09/20 2253 5\' 4"   (1.626 m)     Head Circumference --      Peak Flow --      Pain Score 12/09/20 2253 9     Pain Loc --      Pain Edu? --      Excl. in GC? --     Constitutional: Alert and oriented. Well appearing and in no acute distress. Eyes: Conjunctivae are normal. PERRL. EOMI. Head: Atraumatic. Nose: Atraumatic. Mouth/Throat: Mucous membranes are moist.  Edentulous. Neck: No stridor.  No cervical spine tenderness to palpation. Cardiovascular: Normal rate, regular rhythm. Grossly normal heart sounds.  Good peripheral circulation. Respiratory: Normal respiratory effort.  No retractions. Lungs CTAB.  Caved in sternum from cardiac surgery. Gastrointestinal: Soft and nontender to light or deep palpation. No distention. No abdominal bruits.  No CVA tenderness. Musculoskeletal: No lower extremity tenderness nor edema.  No joint effusions. Neurologic: Alert and oriented x3.  CN II to XII grossly intact.  Normal speech and language. No gross focal neurologic deficits are appreciated. No gait instability. Skin:  Skin is warm, dry and intact. No rash noted. Psychiatric: Mood and affect are normal. Speech and behavior are normal.  ____________________________________________   LABS (all labs ordered are listed, but only abnormal results are displayed)  Labs Reviewed  BASIC METABOLIC PANEL - Abnormal; Notable for the following components:      Result Value   Sodium 134 (*)    Calcium 8.7 (*)    All other components within normal limits  CBC - Abnormal; Notable for the following components:   Hemoglobin 10.3 (*)    HCT 32.8 (*)    MCV 67.4 (*)    MCH 21.1 (*)    RDW 18.2 (*)    All other components within normal limits  TROPONIN I (HIGH SENSITIVITY)  TROPONIN I (HIGH SENSITIVITY)   ____________________________________________  EKG  ED ECG REPORT I, Teeghan Hammer J, the attending physician, personally viewed and interpreted this ECG.   Date: 12/10/2020  EKG Time: 2254  Rate: 67  Rhythm: normal  sinus rhythm  Axis: Normal  Intervals:right bundle branch block  ST&T Change: Nonspecific  ____________________________________________  RADIOLOGY I, Librado Guandique J, personally viewed and evaluated these images (plain radiographs) as part of my medical decision making, as well as reviewing the written report by the radiologist.  ED MD interpretation: Bilateral atelectasis; no ICH  Official radiology report(s): DG Chest 2 View  Result Date: 12/09/2020 CLINICAL DATA:  Chest pain EXAM: CHEST - 2 VIEW COMPARISON:  10/28/2019 FINDINGS: Heart is normal size. Bibasilar atelectasis. No confluent opacities or effusions. No acute bony abnormality. IMPRESSION: Bibasilar atelectasis. Electronically Signed   By: Charlett Nose M.D.   On: 12/09/2020 23:27   CT HEAD WO CONTRAST ( )  Result Date: 12/09/2020 CLINICAL DATA:  Head trauma EXAM: CT HEAD WITHOUT CONTRAST TECHNIQUE: Contiguous axial images were obtained from the base of the skull through the vertex without intravenous contrast. COMPARISON:  CT brain 12/25/2018, 02/05/2016 FINDINGS: Brain: No acute territorial infarction, hemorrhage, or new intracranial mass is seen. 7 mm colloid cyst redemonstrated. No ventricular enlargement. Vascular: No hyperdense vessels.  Carotid vascular calcification Skull: Normal. Negative for fracture or focal lesion. Sinuses/Orbits: Mucosal thickening in the left maxillary sinus. Other: None IMPRESSION: 1. No CT evidence for acute intracranial abnormality. 2. 7 mm colloid cyst Electronically Signed   By: Jasmine Pang M.D.   On: 12/09/2020 23:33    ____________________________________________   PROCEDURES  Procedure(s) performed (including Critical Care):  Procedures   ____________________________________________   INITIAL IMPRESSION / ASSESSMENT AND PLAN / ED COURSE  As part of my medical decision making, I reviewed the following data within the electronic MEDICAL RECORD NUMBER Nursing notes reviewed and  incorporated, Labs reviewed, EKG interpreted, Old chart reviewed, Radiograph reviewed, Notes from prior ED visits, and [place] Controlled Substance Database     60 year old female presenting with headache since striking her head over 2 weeks ago. Differential diagnosis includes, but is not limited to, intracranial hemorrhage, meningitis/encephalitis, previous head trauma, cavernous venous thrombosis, tension headache, temporal arteritis, migraine or migraine equivalent, idiopathic intracranial hypertension, and non-specific headache.   Work-up including CT imaging and 2 sets of troponins unremarkable.  Patient's symptoms consistent with postconcussive syndrome.  Will prescribe analgesia, antiemetic and refer patient to neurology for outpatient follow-up.  Strict  return precautions given.  Patient verbalizes understanding and agrees with plan of care.      ____________________________________________   FINAL CLINICAL IMPRESSION(S) / ED DIAGNOSES  Final diagnoses:  Post concussion syndrome     ED Discharge Orders          Ordered    oxyCODONE-acetaminophen (PERCOCET/ROXICET) 5-325 MG tablet  Every 4 hours PRN        12/10/20 0409    ondansetron (ZOFRAN ODT) 4 MG disintegrating tablet  Every 8 hours PRN        12/10/20 0409             Note:  This document was prepared using Dragon voice recognition software and may include unintentional dictation errors.    Irean HongSung, [place]quette Blodgett J, MD 12/10/20 [phone]

## 2021-01-25 ENCOUNTER — Emergency Department
Admission: EM | Admit: 2021-01-25 | Discharge: 2021-01-25 | Disposition: A | Discharge status: Home / Self Care | Payer: Medicare Other | Attending: Emergency Medicine | Admitting: Emergency Medicine

## 2021-01-25 ENCOUNTER — Emergency Department: Payer: Medicare Other

## 2021-01-25 ENCOUNTER — Encounter: Payer: Self-pay | Admitting: Emergency Medicine

## 2021-01-25 ENCOUNTER — Other Ambulatory Visit: Payer: Self-pay

## 2021-01-25 DIAGNOSIS — Z20822 Contact with and (suspected) exposure to covid-19: Secondary | ICD-10-CM | POA: Diagnosis not present

## 2021-01-25 DIAGNOSIS — F17210 Nicotine dependence, cigarettes, uncomplicated: Secondary | ICD-10-CM | POA: Diagnosis not present

## 2021-01-25 DIAGNOSIS — R0789 Other chest pain: Secondary | ICD-10-CM | POA: Diagnosis present

## 2021-01-25 DIAGNOSIS — J40 Bronchitis, not specified as acute or chronic: Secondary | ICD-10-CM | POA: Insufficient documentation

## 2021-01-25 DIAGNOSIS — I2510 Atherosclerotic heart disease of native coronary artery without angina pectoris: Secondary | ICD-10-CM | POA: Insufficient documentation

## 2021-01-25 DIAGNOSIS — Z91040 Latex allergy status: Secondary | ICD-10-CM | POA: Insufficient documentation

## 2021-01-25 DIAGNOSIS — J449 Chronic obstructive pulmonary disease, unspecified: Secondary | ICD-10-CM | POA: Insufficient documentation

## 2021-01-25 DIAGNOSIS — I509 Heart failure, unspecified: Secondary | ICD-10-CM | POA: Diagnosis not present

## 2021-01-25 DIAGNOSIS — Z7982 Long term (current) use of aspirin: Secondary | ICD-10-CM | POA: Diagnosis not present

## 2021-01-25 DIAGNOSIS — E119 Type 2 diabetes mellitus without complications: Secondary | ICD-10-CM | POA: Insufficient documentation

## 2021-01-25 DIAGNOSIS — Z79899 Other long term (current) drug therapy: Secondary | ICD-10-CM | POA: Insufficient documentation

## 2021-01-25 DIAGNOSIS — I110 Hypertensive heart disease with heart failure: Secondary | ICD-10-CM | POA: Insufficient documentation

## 2021-01-25 LAB — CBC
HCT: 31.5 % — ABNORMAL LOW (ref 36.0–46.0)
Hemoglobin: 9.3 g/dL — ABNORMAL LOW (ref 12.0–15.0)
MCH: 20 pg — ABNORMAL LOW (ref 26.0–34.0)
MCHC: 29.5 g/dL — ABNORMAL LOW (ref 30.0–36.0)
MCV: 67.6 fL — ABNORMAL LOW (ref 80.0–100.0)
Platelets: 136 10*3/uL — ABNORMAL LOW (ref 150–400)
RBC: 4.66 MIL/uL (ref 3.87–5.11)
RDW: 20.4 % — ABNORMAL HIGH (ref 11.5–15.5)
WBC: 5.6 10*3/uL (ref 4.0–10.5)
nRBC: 0 % (ref 0.0–0.2)

## 2021-01-25 LAB — BASIC METABOLIC PANEL
Anion gap: 10 (ref 5–15)
BUN: 12 mg/dL (ref 8–23)
CO2: 24 mmol/L (ref 22–32)
Calcium: 9.1 mg/dL (ref 8.9–10.3)
Chloride: 103 mmol/L (ref 98–111)
Creatinine, Ser: 0.5 mg/dL (ref 0.44–1.00)
GFR, Estimated: >60 mL/min (ref >60)
Glucose, Bld: 97 mg/dL (ref 70–99)
Potassium: 3.6 mmol/L (ref 3.5–5.1)
Sodium: 137 mmol/L (ref 135–145)

## 2021-01-25 LAB — SARS CORONAVIRUS 2 (TAT 6-24 HRS): SARS Coronavirus 2: NEGATIVE

## 2021-01-25 LAB — TROPONIN I (HIGH SENSITIVITY): Troponin I (High Sensitivity): 5 ng/L (ref <18)

## 2021-01-25 MED ORDER — AZITHROMYCIN 250 MG PO TABS: ORAL_TABLET | ORAL | 0 refills | Status: AC

## 2021-01-25 MED ORDER — AZITHROMYCIN 250 MG PO TABS: ORAL_TABLET | ORAL | 0 refills | Status: DC

## 2021-01-25 MED ORDER — IPRATROPIUM-ALBUTEROL 0.5-2.5 (3) MG/3ML IN SOLN
3.0000 mL | Freq: Once | RESPIRATORY_TRACT | Status: AC
  Administered 2021-01-25: 3 mL via RESPIRATORY_TRACT
  Filled 2021-01-25: qty 3

## 2021-01-25 MED ORDER — DEXAMETHASONE SODIUM PHOSPHATE 10 MG/ML IJ SOLN
8.0000 mg | Freq: Once | INTRAMUSCULAR | Status: AC
  Administered 2021-01-25: 8 mg via INTRAMUSCULAR
  Filled 2021-01-25: qty 1

## 2021-01-25 MED ORDER — AZITHROMYCIN 500 MG PO TABS
500.0000 mg | ORAL_TABLET | Freq: Once | ORAL | Status: AC
  Administered 2021-01-25: 500 mg via ORAL
  Filled 2021-01-25: qty 1

## 2021-01-25 NOTE — ED Triage Notes (Signed)
Presents with chest heaviness and SOB  also feels lijke her heart is racing

## 2021-01-25 NOTE — ED Provider Notes (Signed)
[facility] Emergency Department Provider Note   ____________________________________________    I have reviewed the triage vital signs and the nursing notes.   HISTORY  Chief Complaint Chest Pain and Cough     HPI Meghan Jimenez is a 60 y.o. female with a history of CAD, cirrhosis, CHF, COPD, diabetes who presents with, complaints of chronic chest tightness and fatigue.  Patient reports a history of heart surgery in the past and she had poor healing of sternum postsurgery.  She always has tightness in her center chest, she reports it has been worse over the last week, she has been feeling fatigued, denies fevers or chills.  Feels as though her lungs need to be "loosened up ".  No loss of taste or smell.  Past Medical History:  Diagnosis Date   CAD (coronary artery disease)    CHF (congestive heart failure) (HCC)    Cirrhosis (HCC)    COPD (chronic obstructive pulmonary disease) (HCC)    Diabetes mellitus without complication (HCC)    Hepatitis C    Hypertension     Patient Active Problem List   Diagnosis Date Noted   Chest pain 11/12/2016   PTSD (post-traumatic stress disorder) 12/13/2015   Dysthymia 12/13/2015   GIB (gastrointestinal bleeding) 12/10/2015   Unstable angina (HCC) 11/11/2015    Past Surgical History:  Procedure Laterality Date   BACK SURGERY     CARDIAC SURGERY     ESOPHAGOGASTRODUODENOSCOPY (EGD) WITH PROPOFOL N/A 12/11/2015   Procedure: ESOPHAGOGASTRODUODENOSCOPY (EGD) WITH PROPOFOL;  Surgeon: [name], MD;  Location: [facility] ENDOSCOPY;  Service: Gastroenterology;  Laterality: N/A;   LEFT HEART CATH AND CORONARY ANGIOGRAPHY N/A 11/13/2016   Procedure: Left Heart Cath and Coronary Angiography;  Surgeon: [name], MD;  Location: [facility] INVASIVE CV LAB;  Service: Cardiovascular;  Laterality: N/A;    Prior to Admission medications   Medication Sig Start Date End Date Taking? Authorizing Provider  aspirin EC 81 MG  tablet Take 81 mg by mouth daily.    [provider]  atorvastatin (LIPITOR) 40 MG tablet Take 40 mg by mouth daily. 11/27/16   [provider]  azithromycin (ZITHROMAX Z-PAK) 250 MG tablet Take 2 tablets (500 mg) on  Day 1,  followed by 1 tablet (250 mg) once daily on Days 2 through 5. 01/25/21 01/30/21  Jene Every, MD  ceFAZolin (ANCEF) 1 g injection 2 g by Other route every 8 (eight) hours.  01/23/17   [provider]  diphenhydrAMINE (BENADRYL) 50 MG capsule Take 50 mg by mouth every 6 (six) hours as needed.    [provider]  gabapentin (NEURONTIN) 300 MG capsule Take 1 capsule (300 mg total) by mouth 3 (three) times daily. Patient not taking: Reported on 03/01/2017 12/14/15   Auburn Bilberry, MD  hydrOXYzine (ATARAX/VISTARIL) 10 MG tablet Take 1 tablet (10 mg total) by mouth 3 (three) times daily as needed. Patient not taking: Reported on 03/01/2017 12/14/15   Auburn Bilberry, MD  metoprolol succinate (TOPROL-XL) 25 MG 24 hr tablet Take 12.5 mg by mouth daily.    [provider]  Multiple Vitamin (MULTIVITAMIN WITH MINERALS) TABS tablet Take 1 tablet by mouth daily.    [provider]  ondansetron (ZOFRAN ODT) 4 MG disintegrating tablet Take 1 tablet (4 mg total) by mouth every 8 (eight) hours as needed for nausea or vomiting. 12/10/20   Irean Hong, MD  oxyCODONE (OXY IR/ROXICODONE) 5 MG immediate release tablet Take 1  tablet by mouth 4 (four) times daily as needed. 11/27/16   [provider]  oxyCODONE-acetaminophen (PERCOCET/ROXICET) 5-325 MG tablet Take 1 tablet by mouth every 4 (four) hours as needed for severe pain. 12/10/20   Irean Hong, MD  pantoprazole (PROTONIX) 40 MG tablet Take 40 mg by mouth 2 (two) times daily before a meal.    [provider]  potassium chloride SA (KLOR-CON M20) 20 MEQ tablet Take 40 mEq by mouth daily.    [provider]  pregabalin (LYRICA) 300 MG capsule Take 1 capsule (300 mg  total) by mouth 2 (two) times daily for 5 days. 09/21/19 09/26/19  Nita Sickle, MD  prochlorperazine (COMPAZINE) 10 MG tablet Take 1 tablet (10 mg total) by mouth every 6 (six) hours as needed for nausea (headache). 12/25/18   Irean Hong, MD  spironolactone (ALDACTONE) 25 MG tablet Take 50 mg by mouth daily.     [provider]  traZODone (DESYREL) 100 MG tablet Take 1 tablet (100 mg total) by mouth at bedtime. 12/14/15   Auburn Bilberry, MD     Allergies Aspirin, Tramadol, Acetaminophen, Latex, Codeine, and Vicodin [hydrocodone-acetaminophen]  Family History  Problem Relation Age of Onset   CAD Mother    Diabetes Mother    Lung cancer Father     Social History Social History   Tobacco Use   Smoking status: Every Day    Packs/day: 1.00    Types: Cigarettes   Smokeless tobacco: Never   Tobacco comments:    SMOKES 3-4 PKS/DAY  Substance Use Topics   Alcohol use: Yes    Comment: Drinks occasionally every week   Drug use: Yes    Types: "Crack" cocaine    Review of Systems  Constitutional: No fever/chills. fatigue Eyes: No visual changes.  ENT: No sore throat. Cardiovascular: as above Respiratory: Denies shortness of breath. Gastrointestinal: No abdominal pain.  No nausea, no vomiting.   Genitourinary: Negative for dysuria. Musculoskeletal: Negative for back pain. Skin: Negative for rash. Neurological: Negative for headaches or weakness   ____________________________________________   PHYSICAL EXAM:  VITAL SIGNS: ED Triage Vitals  Enc Vitals Group     BP 01/25/21 1103 (!) 145/69     Pulse Rate 01/25/21 0743 89     Resp 01/25/21 0743 20     Temp 01/25/21 0743 98.4 F (36.9 C)     Temp Source 01/25/21 0743 Oral     SpO2 01/25/21 0743 99 %     Weight 01/25/21 0739 83.9 kg (184 lb 15.5 oz)     Height 01/25/21 0739 1.626 m (5\' 4" )     Head Circumference --      Peak Flow --      Pain Score 01/25/21 0745 8     Pain Loc --      Pain Edu? --       Excl. in GC? --     Constitutional: Alert and oriented. No acute distress. Pleasant and interactive Eyes: Conjunctivae are normal.  Head: Atraumatic. Nose: No congestion/rhinnorhea. Mouth/Throat: Mucous membranes are moist.   Neck:  Painless ROM Cardiovascular: Normal rate, regular rhythm. Grossly normal heart sounds.  Good peripheral circulation.  Chest wall: Center of sternum is sunken in, consistent with her description of poor healing, no erythema or fluctuance. Respiratory: Normal respiratory effort.  No retractions. Lungs CTAB. Gastrointestinal: Soft and nontender. No distention.  No CVA tenderness. Genitourinary: deferred Musculoskeletal: No lower extremity tenderness nor edema.  Warm and well perfused Neurologic:  Normal speech and language. No gross focal neurologic deficits are appreciated.  Skin:  Skin is warm, dry and intact. No rash noted. Psychiatric: Mood and affect are normal. Speech and behavior are normal.  ____________________________________________   LABS (all labs ordered are listed, but only abnormal results are displayed)  Labs Reviewed  CBC - Abnormal; Notable for the following components:      Result Value   Hemoglobin 9.3 (*)    HCT 31.5 (*)    MCV 67.6 (*)    MCH 20.0 (*)    MCHC 29.5 (*)    RDW 20.4 (*)    Platelets 136 (*)    All other components within normal limits  SARS CORONAVIRUS 2 (TAT 6-24 HRS)  BASIC METABOLIC PANEL  TROPONIN I (HIGH SENSITIVITY)   ____________________________________________  EKG  ED ECG REPORT I, Jene Every, the attending physician, personally viewed and interpreted this ECG.  Date: 01/25/2021  Rhythm: normal sinus rhythm QRS Axis: normal Intervals: RBBB, old ST/T Wave abnormalities: normal Narrative Interpretation: no evidence of acute ischemia  ____________________________________________  RADIOLOGY  Cxr reviewed by me, no acute  abnormality   ____________________________________________   PROCEDURES  Procedure(s) performed: No  Procedures   Critical Care performed: No ____________________________________________   INITIAL IMPRESSION / ASSESSMENT AND PLAN / ED COURSE  Pertinent labs & imaging results that were available during my care of the patient were reviewed by me and considered in my medical decision making (see chart for details).   Patient presents with complaints as above, it sounds as though her chest discomfort is chronic and related to her poorly healed sternum status post surgery.  She denies fevers and chills or loss of taste or smell to suggest COVID infection however we will send COVID swab given fatigue, occasional headache she is describing.  Chest x-ray is reassuring, not consistent with pneumonia, treated with DuoNeb for possible mild bronchospasm  Lab work is reassuring, troponin is normal.  Hemoglobin and platelets are known about by her PCP, she is on iron tablets, and upper endoscopy is being scheduled  No indication for admission at this time, appropriate for discharge with outpatient follow-up, return precautions discussed    ____________________________________________   FINAL CLINICAL IMPRESSION(S) / ED DIAGNOSES  Final diagnoses:  Atypical chest pain  Bronchitis        Note:  This document was prepared using Dragon voice recognition software and may include unintentional dictation errors.    Jene Every, MD 01/25/21 [phone]

## 2021-01-25 NOTE — ED Provider Notes (Signed)
Emergency Medicine Provider Triage Evaluation Note  Meghan Jimenez , a 60 y.o. female  was evaluated in triage.  Pt complains of chest pain or shortness of breath, awoke with this morning.  Review of Systems  Positive: Chest pain, shortness of breath Negative: Fever, chills, abdominal pain  Physical Exam  Pulse 89   Temp 98.4 F (36.9 C) (Oral)   Resp 20   Ht 5\' 4"  (1.626 m)   Wt 83.9 kg   SpO2 99%   BMI 31.75 kg/m  Gen:   Awake, no distress   Resp:  Normal effort lungs with mild wheezing noted, cough is congested MSK:   Moves extremities without difficulty  Other:    Medical Decision Making  Medically screening exam initiated at 7:47 AM.  Appropriate orders placed.  Meghan Jimenez was informed that the remainder of the evaluation will be completed by another provider, this initial triage assessment does not replace that evaluation, and the importance of remaining in the ED until their evaluation is complete.  Patient has history of CABG, planing of chest pain, shortness of breath, states my chest does not feel so good, patient instructed to stay in informed her we are doing cardiac work-up   Amedeo Plenty, PA-C 01/25/21 01/27/21    [id number], MD 01/25/21 [phone]

## 2021-05-03 DIAGNOSIS — D509 Iron deficiency anemia, unspecified: Secondary | ICD-10-CM | POA: Insufficient documentation

## 2021-05-14 ENCOUNTER — Other Ambulatory Visit: Payer: Self-pay

## 2021-05-14 ENCOUNTER — Encounter: Payer: Self-pay | Admitting: Emergency Medicine

## 2021-05-14 ENCOUNTER — Emergency Department: Payer: Medicare (Managed Care)

## 2021-05-14 ENCOUNTER — Emergency Department
Admission: EM | Admit: 2021-05-14 | Discharge: 2021-05-14 | Disposition: A | Discharge status: Home / Self Care | Payer: Medicare (Managed Care) | Attending: Emergency Medicine | Admitting: Emergency Medicine

## 2021-05-14 DIAGNOSIS — W290XXA Contact with powered kitchen appliance, initial encounter: Secondary | ICD-10-CM | POA: Diagnosis not present

## 2021-05-14 DIAGNOSIS — S60941A Unspecified superficial injury of left index finger, initial encounter: Secondary | ICD-10-CM | POA: Diagnosis present

## 2021-05-14 DIAGNOSIS — S61213A Laceration without foreign body of left middle finger without damage to nail, initial encounter: Secondary | ICD-10-CM | POA: Diagnosis not present

## 2021-05-14 DIAGNOSIS — S61313A Laceration without foreign body of left middle finger with damage to nail, initial encounter: Secondary | ICD-10-CM

## 2021-05-14 LAB — CBG MONITORING, ED: Glucose-Capillary: 114 mg/dL — ABNORMAL HIGH (ref 70–99)

## 2021-05-14 MED ORDER — OXYCODONE-ACETAMINOPHEN 5-325 MG PO TABS: 1.0000 | ORAL_TABLET | Freq: Three times a day (TID) | ORAL | 0 refills | Status: DC | PRN

## 2021-05-14 MED ORDER — LIDOCAINE HCL 2 % IJ SOLN
20.0000 mL | Freq: Once | INTRAMUSCULAR | Status: DC
  Filled 2021-05-14: qty 20

## 2021-05-14 MED ORDER — LIDOCAINE HCL (PF) 1 % IJ SOLN
INTRAMUSCULAR | Status: AC
  Filled 2021-05-14: qty 5

## 2021-05-14 MED ORDER — BUPIVACAINE HCL (PF) 0.5 % IJ SOLN
10.0000 mL | Freq: Once | INTRAMUSCULAR | Status: AC
  Administered 2021-05-14: 10 mL
  Filled 2021-05-14: qty 10

## 2021-05-14 MED ORDER — LIDOCAINE 1% INJECTION FOR CIRCUMCISION
5.0000 mL | INJECTION | Freq: Once | INTRAVENOUS | Status: DC
  Filled 2021-05-14: qty 5

## 2021-05-14 MED ORDER — CEPHALEXIN 500 MG PO CAPS: 500.0000 mg | ORAL_CAPSULE | Freq: Three times a day (TID) | ORAL | 0 refills | Status: AC

## 2021-05-14 NOTE — ED Notes (Signed)
Non-adherent dressing placed and wrapped with tube dressing.

## 2021-05-14 NOTE — ED Notes (Signed)
Pt to ED with grandson, for L middle finger laceration this morning when she stuck her hand into blender. Grandson states the laceration is deep but "nothing came off". Finger is bandaged and bleeding is controlled. Pt complains of throbbing pain.

## 2021-05-14 NOTE — ED Triage Notes (Signed)
Cut left fourth finger on blender this morning.  Bleeding controlled.  DSD placed.

## 2021-05-14 NOTE — Discharge Instructions (Addendum)
Keep the finger clean and dry.  You can use painters tape to tape a baggy to the finger to keep it dry when you are bathing.  You can take the dressing off in 2 days.  After that keep the finger covered with a ouchless or nonstick Band-Aid. Recheck here or with your doctor in 2 days to see how your finger is doing and stitches out in about 10 days.  If you can stop smoking or at least cut down on the amount of cigarettes you smoke that will help the wound heal faster and better.   Take the Keflex antibiotic 1 pill 3 times a day to help prevent infection and if you need pain medicine and Tylenol is not enough you can use Percocet 1 pill 3 times a day.  Be careful the Percocet can make you woozy and constipated.  Do not drive on it.  Be careful not to fall.  Do not operate any hazardous machinery while you are taking it.

## 2021-05-14 NOTE — ED Provider Notes (Addendum)
Meghan Jimenez    Event Date/Time   First MD Initiated Contact with Patient 05/14/21 [phone]     (approximate)   History   Hand Injury   HPI  Meghan Jimenez is a 61 y.o. female who put her finger in a blender to push some material down and ended up hitting the blade.  She has multiple lacerations on the tip of her left index finger.      Physical Exam   Triage Vital Signs: ED Triage Vitals  Enc Vitals Group     BP 05/14/21 0842 128/74     Pulse Rate 05/14/21 0842 (!) 120     Resp 05/14/21 0842 16     Temp 05/14/21 0842 97.8 F (36.6 C)     Temp Source 05/14/21 0842 Oral     SpO2 05/14/21 0842 96 %     Weight 05/14/21 0830 184 lb 15.5 oz (83.9 kg)     Height 05/14/21 0830 5\' 4"  (1.626 m)     Head Circumference --      Peak Flow --      Pain Score 05/14/21 0830 7     Pain Loc --      Pain Edu? --      Excl. in GC? --     Most recent vital signs: Vitals:   05/14/21 0842  BP: 128/74  Pulse: (!) 120  Resp: 16  Temp: 97.8 F (36.6 C)  SpO2: 96%     General: Awake, complaining of pain shooting up to the elbow CV:  Good peripheral perfusion.  Resp:  Normal effort.  Hand: Left hand with at least 5 cuts from the blade to the tip of the finger.  One of them includes the nailbed.  Part of the nail has been removed by the blender.  The finger looks almost like the back of the [facility] this been hit by a boat propeller.   ED Results / Procedures / Treatments   Labs (all labs ordered are listed, but only abnormal results are displayed) Labs Reviewed  CBG MONITORING, ED - Abnormal; Notable for the following components:      Result Value   Glucose-Capillary 114 (*)    All other components within normal limits     EKG     RADIOLOGY  X-ray read by radiology reviewed by me shows no acute fractures.  Radiology remarks on indentation in the middle phalanx.  There are no injuries in the middle phalanx  area.   PROCEDURES:  Critical Care performed:   Procedures oral consent obtain.  Finger involved was cleaned digital block with 1% lidocaine and 0.5% bupivacaine was done.  Patient tolerated well. After adequate anesthesia was obtained finger was reclamped.  I put a sterile glove on her cut the fingertip off and rolled the edges of the gloved finger down to create a tourniquet effect.  I then reclaimed and reirrigated the finger with copious amounts of normal saline and repaired the multiple overlapping lacerations and pieces of skin with 10 stitches.  There were at least 2's cuts in the nail itself.  I attempted to remove the nail from the nailbed but began to shred a small amount of the nailbed so I stopped and repair the lacerations there by sewing directly through the nail.  Alignment of the nail plate was good.  Several of the lacerations were parallel and so close together that I was required to incorporate more than  1 laceration in the stitch.  A cosmetic effect appears to be good after repairs.  Patient tolerated this well.  She is reporting that the anesthetic is beginning to wear off and her finger is throbbing now.  MEDICATIONS ORDERED IN ED: Medications  lidocaine 1 % injection for CIRC 5 mL (5 mLs Other Not Given 05/14/21 1151)  bupivacaine (MARCAINE) 0.5 % injection 10 mL (10 mLs Infiltration Given 05/14/21 1153)  lidocaine (PF) (XYLOCAINE) 1 % injection (  Given 05/14/21 1150)     IMPRESSION / MDM / ASSESSMENT AND PLAN / ED COURSE  I reviewed the triage vital signs and the nursing notes.                              Differential diagnosis includes injury from blender. Complex laceration repair.  Total length of the lacerations and to and would probably be about 2-1/2 cm.  I did review the x-rays in detail myself and concur with the radiologist report.  Patient reports her tetanus shot was within the last 5 years.  No other medications besides the 1% lidocaine and 0.5% bupivacaine  were used.  We did get a fingerstick and 20 new procedures patient began to get very sleepy but the fingerstick was 114.  Patient woke up and I took the tourniquet off.  She looks normal now. She will be at home.  Her son will help watch her.  She will return for any further problems. Patient's pulse rate is more like 90 when I check it manually. Patient reports she was grinding ice.  Everything was clean.  I warned her at least twice that due to the nail lacerations the nail might grow back in a regular fashion or with ridges.   FINAL CLINICAL IMPRESSION(S) / ED DIAGNOSES   Final diagnoses:  Laceration of left middle finger without foreign body with damage to nail, initial encounter     Rx / DC Orders   ED Discharge Orders          Ordered    cephALEXin (KEFLEX) 500 MG capsule  3 times daily        05/14/21 1200             Jimenez:  This document was prepared using Dragon voice recognition software and may include unintentional dictation errors.   Arnaldo Natal, MD 05/14/21 1203 ----------------------------------------- 12:07 PM on 05/14/2021 ----------------------------------------- Patient's pulse is 80.  There are occasional irregular beats.  Patient reports she can take Percocet without any trouble.  She says she can also take Vicodin.   Arnaldo Natal, MD 05/14/21 4098    Arnaldo Natal, MD 05/14/21 1210

## 2021-05-24 ENCOUNTER — Emergency Department
Admission: EM | Admit: 2021-05-24 | Discharge: 2021-05-24 | Disposition: A | Discharge status: Home / Self Care | Payer: Medicare (Managed Care) | Attending: Emergency Medicine | Admitting: Emergency Medicine

## 2021-05-24 ENCOUNTER — Encounter: Payer: Self-pay | Admitting: Emergency Medicine

## 2021-05-24 ENCOUNTER — Other Ambulatory Visit: Payer: Self-pay

## 2021-05-24 ENCOUNTER — Emergency Department: Payer: Medicare (Managed Care)

## 2021-05-24 DIAGNOSIS — E119 Type 2 diabetes mellitus without complications: Secondary | ICD-10-CM | POA: Insufficient documentation

## 2021-05-24 DIAGNOSIS — D649 Anemia, unspecified: Secondary | ICD-10-CM | POA: Insufficient documentation

## 2021-05-24 DIAGNOSIS — M7989 Other specified soft tissue disorders: Secondary | ICD-10-CM | POA: Diagnosis present

## 2021-05-24 DIAGNOSIS — I110 Hypertensive heart disease with heart failure: Secondary | ICD-10-CM | POA: Insufficient documentation

## 2021-05-24 DIAGNOSIS — D696 Thrombocytopenia, unspecified: Secondary | ICD-10-CM | POA: Diagnosis not present

## 2021-05-24 DIAGNOSIS — J449 Chronic obstructive pulmonary disease, unspecified: Secondary | ICD-10-CM | POA: Diagnosis not present

## 2021-05-24 DIAGNOSIS — I509 Heart failure, unspecified: Secondary | ICD-10-CM | POA: Insufficient documentation

## 2021-05-24 DIAGNOSIS — I2510 Atherosclerotic heart disease of native coronary artery without angina pectoris: Secondary | ICD-10-CM | POA: Insufficient documentation

## 2021-05-24 LAB — CBC
HCT: 27.3 % — ABNORMAL LOW (ref 36.0–46.0)
Hemoglobin: 7.7 g/dL — ABNORMAL LOW (ref 12.0–15.0)
MCH: 20.4 pg — ABNORMAL LOW (ref 26.0–34.0)
MCHC: 28.2 g/dL — ABNORMAL LOW (ref 30.0–36.0)
MCV: 72.2 fL — ABNORMAL LOW (ref 80.0–100.0)
Platelets: 62 10*3/uL — ABNORMAL LOW (ref 150–400)
RBC: 3.78 MIL/uL — ABNORMAL LOW (ref 3.87–5.11)
RDW: 32.5 % — ABNORMAL HIGH (ref 11.5–15.5)
WBC: 4.9 10*3/uL (ref 4.0–10.5)
nRBC: 0 % (ref 0.0–0.2)

## 2021-05-24 LAB — BASIC METABOLIC PANEL
Anion gap: 11 (ref 5–15)
BUN: 8 mg/dL (ref 8–23)
CO2: 20 mmol/L — ABNORMAL LOW (ref 22–32)
Calcium: 8.7 mg/dL — ABNORMAL LOW (ref 8.9–10.3)
Chloride: 102 mmol/L (ref 98–111)
Creatinine, Ser: 0.82 mg/dL (ref 0.44–1.00)
GFR, Estimated: >60 mL/min (ref >60)
Glucose, Bld: 120 mg/dL — ABNORMAL HIGH (ref 70–99)
Potassium: 4.2 mmol/L (ref 3.5–5.1)
Sodium: 133 mmol/L — ABNORMAL LOW (ref 135–145)

## 2021-05-24 LAB — TROPONIN I (HIGH SENSITIVITY)
Troponin I (High Sensitivity): 8 ng/L (ref <18)
Troponin I (High Sensitivity): 6 ng/L (ref <18)

## 2021-05-24 LAB — BRAIN NATRIURETIC PEPTIDE: B Natriuretic Peptide: 72.3 pg/mL (ref 0.0–100.0)

## 2021-05-24 MED ORDER — ACETAMINOPHEN 325 MG PO TABS
650.0000 mg | ORAL_TABLET | Freq: Once | ORAL | Status: AC
  Administered 2021-05-24: 650 mg via ORAL
  Filled 2021-05-24: qty 2

## 2021-05-24 NOTE — Discharge Instructions (Signed)
Your hemoglobin was 7.7 today, which is low but not at a level where you would need a transfusion.  Please follow-up with your primary care provider as if your hemoglobin continues to drop he will need another transfusion.  Your platelets were also low which puts you at higher risk of bleeding.  If you have any blood in your stool, black stools or any other bleeding, please return to the emergency department.  Otherwise please follow-up with your primary care provider.  Please avoid NSAIDs and continue to take your iron supplementation.

## 2021-05-24 NOTE — ED Provider Triage Note (Signed)
Emergency Medicine Provider Triage Evaluation Note  Meghan Jimenez , a 61 y.o. female  was evaluated in triage.  Pt complains of bilateral lower extremity swelling and shortness of breath for the past 2 weeks. Legs hurt in groin area. No history of DVT.  Review of Systems  Positive: Bilateral LE edema Negative: Erythema  Physical Exam  There were no vitals taken for this visit. Gen:   Awake, no distress   Resp:  Normal effort  MSK:   Moves extremities without difficulty  Other:    Medical Decision Making  Medically screening exam initiated at 6:27 PM.  Appropriate orders placed.  Meghan Jimenez was informed that the remainder of the evaluation will be completed by another provider, this initial triage assessment does not replace that evaluation, and the importance of remaining in the ED until their evaluation is complete.    Chinita Pester, FNP 05/24/21 2231

## 2021-05-24 NOTE — ED Triage Notes (Signed)
Pt to ED via POV with c/o bilat lower leg swelling for the last week. She is also [facility] and has fallen in the last 2 or 3 weeks. Pt is able to speak in complete sentences at this time.

## 2021-05-24 NOTE — ED Provider Notes (Signed)
[facility] Provider Note    Event Date/Time   First MD Initiated Contact with Patient 05/24/21 2310     (approximate)   History   Leg Swelling   HPI  Meghan Jimenez is a 61 y.o. female past medical history of CHF, cirrhosis, CAD and diabetes, hypertension hepatitis C who presents with lower extremity edema.  Patient notes that she has had bilateral lower extremity swelling for the last several weeks.  Has had some redness around the ankles as well and the legs are painful.  Patient has had some mild dyspnea associated with cough and has been told she had bronchitis.  Patient denies any melena or bright red blood per rectum, hematemesis.  She denies chest pain other than the chronic pain she has from her previous sternal surgery.  She denies fevers or chills.  Reviewed prior records from ED visit at [facility], patient was found to have a hemoglobin of 5.7 and was transfused 2 units of PRBCs.  Patient tells me that she has had a chronic anemia.  She has follow-up with gastroenterology and is supposed to have an EGD this month.  Patient's's grandson who is with her and provides care for her notes that she is chronically up on her feet and does not like to elevate them or wear compression stockings.    Past Medical History:  Diagnosis Date   CAD (coronary artery disease)    CHF (congestive heart failure) (HCC)    Cirrhosis (HCC)    COPD (chronic obstructive pulmonary disease) ([place])    Diabetes mellitus without complication ([place])    Hepatitis C    Hypertension     Patient Active Problem List   Diagnosis Date Noted   Chest pain 11/12/2016   PTSD (post-traumatic stress disorder) 12/13/2015   Dysthymia 12/13/2015   GIB (gastrointestinal bleeding) 12/10/2015   Unstable angina (HCC) 11/11/2015     Physical Exam  Triage Vital Signs: ED Triage Vitals  Enc Vitals Group     BP 05/24/21 1828 (!) 167/72     Pulse Rate 05/24/21 1828 93     Resp 05/24/21 1828 18      Temp 05/24/21 1828 98 F (36.7 C)     Temp Source 05/24/21 1828 Oral     SpO2 05/24/21 1828 99 %     Weight 05/24/21 1830 160 lb (72.6 kg)     Height 05/24/21 1830 5\' 4"  (1.626 m)     Head Circumference --      Peak Flow --      Pain Score 05/24/21 1829 9     Pain Loc --      Pain Edu? --      Excl. in [place]? --     Most recent vital signs: Vitals:   05/24/21 2042 05/24/21 2312  BP: (!) 156/71 (!) 173/77  Pulse: 90 92  Resp: 18 18  Temp: 98.6 F (37 C)   SpO2: 98% 99%     General: Awake, no distress.  CV:  Good peripheral perfusion.  1+ lower extremity edema bilaterally with mild erythema of the ankles, no warmth, 2+ DP pulses bilaterally Resp:  Normal effort.  Abd:  No distention.  Soft and nontender Neuro:             Awake, Alert, Oriented x 3  Other:  Brown stool on rectal exam   ED Results / Procedures / Treatments  Labs (all labs ordered are listed, but only abnormal results are  displayed) Labs Reviewed  BASIC METABOLIC PANEL - Abnormal; Notable for the following components:      Result Value   Sodium 133 (*)    CO2 20 (*)    Glucose, Bld 120 (*)    Calcium 8.7 (*)    All other components within normal limits  CBC - Abnormal; Notable for the following components:   RBC 3.78 (*)    Hemoglobin 7.7 (*)    HCT 27.3 (*)    MCV 72.2 (*)    MCH 20.4 (*)    MCHC 28.2 (*)    RDW 32.5 (*)    Platelets 62 (*)    All other components within normal limits  BRAIN NATRIURETIC PEPTIDE  TROPONIN I (HIGH SENSITIVITY)  TROPONIN I (HIGH SENSITIVITY)     EKG  EKG interpreted by myself, right bundle branch block, normal axis normal sinus rhythm, no acute ischemic changes   RADIOLOGY I reviewed the CXR which does not show any acute cardiopulmonary process; agree with radiology report     PROCEDURES:  Critical Care performed: No  Procedures  The patient is on the cardiac monitor to evaluate for evidence of arrhythmia and/or significant heart rate  changes.   MEDICATIONS ORDERED IN ED: Medications  acetaminophen (TYLENOL) tablet 650 mg (650 mg Oral Given 05/24/21 2350)     IMPRESSION / MDM / ASSESSMENT AND PLAN / ED COURSE  I reviewed the triage vital signs and the nursing notes.                              Differential diagnosis includes, but is not limited to, cirrhosis, CHF, renal failure, venous stasis  Patient is a 61 year old female with a history of cirrhosis and hepatitis C who presents with lower extremity edema.  This is been going on for several weeks and is causing her discomfort.  She really has no increased shortness of breath compared to baseline.  Has had a cough for about a week and has been told that she has bronchitis but is not having fevers.  She has chronic chest pain related to a prior sternal surgery which was complicated by dehiscence but this is unchanged.  Patient's vital signs are notable for hypertension but otherwise within normal limits.  I reviewed her EKG which shows a right bundle branch block without acute ischemic changes and her troponins x2 are negative.  Her BNP is normal making CHF less likely.  Patient's chest x-ray also has no pulmonary edema or infiltrate.  On exam she is breathing comfortably is not in respiratory distress and lungs are clear.  Does have bilateral lower extremity edema without evidence of cellulitis and is symmetric so DVT unlikely.  Patient's blood work is notable for anemia with a hemoglobin of 7.7.  Reviewed most recent blood work from [facility] which shows a hemoglobin of 5.7 for which she was transfused 2 units of PRBCs.  Platelets at that time around 140.  Today platelets are 62 which appears to be new for her.  Patient denies any bleeding including any melena hematemesis or blood in her stool.  On rectal exam she has brown stool.  This is likely chronic in nature.  MCV is low indicating likely iron deficiency.  She is currently taking iron supplementation.  She has not below 7 so  does not need a transfusion today given she is not having any symptoms of anemia and is not having any active bleeding  do not feel that she requires hospitalization at this time.  She is scheduled for EGD later this month which I think is important to rule out varices, although again with her brown stool I am not concerned for an acute GI bleed.  Thrombocytopenia potentially due to her underlying cirrhosis.  I encouraged her to follow-up with her primary doctor and gastroenterology regarding this.  In terms of her lower extreme edema suspect venous stasis in the setting of cirrhosis.  I advised keeping the legs elevated and compression stockings.      FINAL CLINICAL IMPRESSION(S) / ED DIAGNOSES   Final diagnoses:  Anemia, unspecified type  Thrombocytopenia ([place])     Rx / DC Orders   ED Discharge Orders     None        Note:  This document was prepared using Dragon voice recognition software and may include unintentional dictation errors.   Rada Hay, MD 05/24/21 2351

## 2022-01-23 ENCOUNTER — Other Ambulatory Visit: Payer: Self-pay

## 2022-01-23 ENCOUNTER — Emergency Department: Payer: Medicare (Managed Care)

## 2022-01-23 DIAGNOSIS — I2510 Atherosclerotic heart disease of native coronary artery without angina pectoris: Secondary | ICD-10-CM | POA: Diagnosis not present

## 2022-01-23 DIAGNOSIS — J449 Chronic obstructive pulmonary disease, unspecified: Secondary | ICD-10-CM | POA: Insufficient documentation

## 2022-01-23 DIAGNOSIS — J181 Lobar pneumonia, unspecified organism: Secondary | ICD-10-CM | POA: Insufficient documentation

## 2022-01-23 DIAGNOSIS — I509 Heart failure, unspecified: Secondary | ICD-10-CM | POA: Insufficient documentation

## 2022-01-23 DIAGNOSIS — R0781 Pleurodynia: Secondary | ICD-10-CM | POA: Diagnosis present

## 2022-01-23 DIAGNOSIS — I110 Hypertensive heart disease with heart failure: Secondary | ICD-10-CM | POA: Insufficient documentation

## 2022-01-23 DIAGNOSIS — E119 Type 2 diabetes mellitus without complications: Secondary | ICD-10-CM | POA: Diagnosis not present

## 2022-01-23 LAB — COMPREHENSIVE METABOLIC PANEL
ALT: 28 U/L (ref 0–44)
AST: 39 U/L (ref 15–41)
Albumin: 3.6 g/dL (ref 3.5–5.0)
Alkaline Phosphatase: 134 U/L — ABNORMAL HIGH (ref 38–126)
Anion gap: 10 (ref 5–15)
BUN: 14 mg/dL (ref 8–23)
CO2: 23 mmol/L (ref 22–32)
Calcium: 8.8 mg/dL — ABNORMAL LOW (ref 8.9–10.3)
Chloride: 105 mmol/L (ref 98–111)
Creatinine, Ser: 0.61 mg/dL (ref 0.44–1.00)
GFR, Estimated: >60 mL/min (ref >60)
Glucose, Bld: 118 mg/dL — ABNORMAL HIGH (ref 70–99)
Potassium: 3.9 mmol/L (ref 3.5–5.1)
Sodium: 138 mmol/L (ref 135–145)
Total Bilirubin: 0.8 mg/dL (ref 0.3–1.2)
Total Protein: 8.1 g/dL (ref 6.5–8.1)

## 2022-01-23 LAB — TROPONIN I (HIGH SENSITIVITY): Troponin I (High Sensitivity): 5 ng/L (ref <18)

## 2022-01-23 NOTE — ED Triage Notes (Signed)
Pt presents via POV c/o right rib pain and SOB x1 week. Reports called by MD last week per pt report and told to report to hospital for collapsed lung. Pt ambulatory to triage. Speaking in full sentences.

## 2022-01-23 NOTE — ED Provider Triage Note (Signed)
Emergency Medicine Provider Triage Evaluation Note  Meghan Jimenez , a 61 y.o. female  was evaluated in triage.  Pt complains of chest tightness, shortness of breath, cough.  Patient has chronic shortness of breath and cough but worse over the last 2 days.  Patient has a history of a chronic right middle lobe lung collapse that has been evaluated at [facility].  There is concern that patient may have underlying malignancy.  This was identified on a CT back in June.  She saw specialist in August.  States that her primary care called her last week and told her to be seen for a collapsed lung.  She has not seen primary care, had recent imaging since June.  No fevers or chills, nasal congestion, GI complaints currently.  Review of Systems  Positive: Chest pain, shortness of breath, cough. Negative: GI complaints, fevers or chills, URI symptoms  Physical Exam  BP (!) 181/81   Pulse 92   Temp 98.5 F (36.9 C) (Oral)   Resp 18   SpO2 91%  Gen:   Awake, no distress   Resp:  Normal effort.  Patient wears oxygen at baseline MSK:   Moves extremities without difficulty  Other:    Medical Decision Making  Medically screening exam initiated at 10:37 PM.  Appropriate orders placed.  [name] was informed that the remainder of the evaluation will be completed by another provider, this initial triage assessment does not replace that evaluation, and the importance of remaining in the ED until their evaluation is complete.  Patient with chest pain, shortness of breath, cough.  This appears chronic in nature but slightly worse over the last 2 days.  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  This was followed by [name], she has not had any recent imaging but states that her primary care called her to tell her to be seen for collapsed lung last week.  Patient labs, chest x-ray, EKG at this time.   Darletta Moll, PA-C 01/23/22 2239

## 2022-01-24 ENCOUNTER — Emergency Department: Payer: Medicare (Managed Care)

## 2022-01-24 ENCOUNTER — Emergency Department
Admission: EM | Admit: 2022-01-24 | Discharge: 2022-01-24 | Disposition: A | Discharge status: Home / Self Care | Payer: Medicare (Managed Care) | Attending: Emergency Medicine | Admitting: Emergency Medicine

## 2022-01-24 DIAGNOSIS — J189 Pneumonia, unspecified organism: Secondary | ICD-10-CM

## 2022-01-24 DIAGNOSIS — R0600 Dyspnea, unspecified: Secondary | ICD-10-CM

## 2022-01-24 LAB — CBC WITH DIFFERENTIAL/PLATELET
Abs Immature Granulocytes: 0.04 10*3/uL (ref 0.00–0.07)
Basophils Absolute: 0 10*3/uL (ref 0.0–0.1)
Basophils Relative: 0 %
Eosinophils Absolute: 0.4 10*3/uL (ref 0.0–0.5)
Eosinophils Relative: 5 %
HCT: 35.4 % — ABNORMAL LOW (ref 36.0–46.0)
Hemoglobin: 11.1 g/dL — ABNORMAL LOW (ref 12.0–15.0)
Immature Granulocytes: 1 %
Lymphocytes Relative: 17 %
Lymphs Abs: 1.2 10*3/uL (ref 0.7–4.0)
MCH: 21.4 pg — ABNORMAL LOW (ref 26.0–34.0)
MCHC: 31.4 g/dL (ref 30.0–36.0)
MCV: 68.3 fL — ABNORMAL LOW (ref 80.0–100.0)
Monocytes Absolute: 0.6 10*3/uL (ref 0.1–1.0)
Monocytes Relative: 8 %
Neutro Abs: 5.1 10*3/uL (ref 1.7–7.7)
Neutrophils Relative %: 69 %
Platelets: 92 10*3/uL — ABNORMAL LOW (ref 150–400)
RBC: 5.18 MIL/uL — ABNORMAL HIGH (ref 3.87–5.11)
RDW: 19.1 % — ABNORMAL HIGH (ref 11.5–15.5)
Smear Review: NORMAL
WBC: 7.3 10*3/uL (ref 4.0–10.5)
nRBC: 0 % (ref 0.0–0.2)

## 2022-01-24 LAB — TROPONIN I (HIGH SENSITIVITY): Troponin I (High Sensitivity): 6 ng/L (ref <18)

## 2022-01-24 LAB — BRAIN NATRIURETIC PEPTIDE: B Natriuretic Peptide: 67.7 pg/mL (ref 0.0–100.0)

## 2022-01-24 MED ORDER — DOXYCYCLINE HYCLATE 100 MG PO TABS
100.0000 mg | ORAL_TABLET | Freq: Once | ORAL | Status: AC
  Administered 2022-01-24: 100 mg via ORAL
  Filled 2022-01-24: qty 1

## 2022-01-24 MED ORDER — ACETAMINOPHEN 500 MG PO TABS
1000.0000 mg | ORAL_TABLET | Freq: Once | ORAL | Status: DC
  Filled 2022-01-24: qty 2

## 2022-01-24 MED ORDER — IOHEXOL 350 MG/ML SOLN
75.0000 mL | Freq: Once | INTRAVENOUS | Status: AC | PRN
  Administered 2022-01-24: 75 mL via INTRAVENOUS

## 2022-01-24 MED ORDER — KETOROLAC TROMETHAMINE 15 MG/ML IJ SOLN
15.0000 mg | Freq: Once | INTRAMUSCULAR | Status: AC
  Administered 2022-01-24: 15 mg via INTRAVENOUS
  Filled 2022-01-24: qty 1

## 2022-01-24 MED ORDER — ALBUTEROL SULFATE HFA 108 (90 BASE) MCG/ACT IN AERS
2.0000 | INHALATION_SPRAY | Freq: Once | RESPIRATORY_TRACT | Status: AC
  Administered 2022-01-24: 2 via RESPIRATORY_TRACT
  Filled 2022-01-24: qty 6.7

## 2022-01-24 MED ORDER — AMOXICILLIN-POT CLAVULANATE 875-125 MG PO TABS
1.0000 | ORAL_TABLET | Freq: Once | ORAL | Status: AC
  Administered 2022-01-24: 1 via ORAL
  Filled 2022-01-24: qty 1

## 2022-01-24 MED ORDER — DOXYCYCLINE MONOHYDRATE 100 MG PO TABS: 100.0000 mg | ORAL_TABLET | Freq: Two times a day (BID) | ORAL | 0 refills | Status: AC

## 2022-01-24 MED ORDER — AMOXICILLIN-POT CLAVULANATE 875-125 MG PO TABS: 1.0000 | ORAL_TABLET | Freq: Two times a day (BID) | ORAL | 0 refills | Status: AC

## 2022-01-24 NOTE — Discharge Instructions (Signed)
Use your inhaler every 4 hours as needed for shortness of breath. Take your antibiotics for the full course as prescribed.  Take acetaminophen 650 mg and ibuprofen 400 mg every 6 hours for pain.  Take with food.  Thank you for choosing [place] for your health care today!  Please see your primary doctor this week for a follow up appointment.   If you do not have a primary doctor call the following clinics to establish care:  If you have insurance:  [facility] [phone] [place] [address]   [facility]  [phone] [address] [place] [address]   If you do not have insurance:  [facility]  [phone] [address], [place] [address]  Sometimes, in the early stages of certain disease courses it is difficult to detect in the emergency department evaluation -- so, it is important that you continue to monitor your symptoms and call your doctor right away or return to the emergency department if you develop any new or worsening symptoms.  It was my pleasure to care for you today.   Hoover Brunette Jacelyn Grip, MD

## 2022-01-24 NOTE — ED Notes (Signed)
Pt reports she was told she had a collapsed lung a week ago by provider and was told to come to ER. Pt said she did not have a ride, so that is why it took a week to get to ER. Pt is A&Ox4. Pt is coughing up thick green mucus. Pt is a smoker and has COPD, no at home O2. Pt has had multiple surgeries to chest, with heart beat visualized in center of chest. Pt says she has an extensive hernia in chest as well.

## 2022-01-24 NOTE — ED Provider Notes (Signed)
[facility] Provider Note    Event Date/Time   First MD Initiated Contact with Patient 01/24/22 0023     (approximate)   History   Shortness of Breath   HPI  Meghan Jimenez is a 61 y.o. female   Past medical history of CAD, CHF, cirrhosis, COPD, diabetes, hypertension and hepatitis C who presents with right-sided chest pain pleuritic in nature, exertional dyspnea for the last week.  Comes to the emergency department because her outpatient doctor told her about the results of a chest x-ray performed about 1 or 2 weeks ago with "collapsed lung" and was told to come to the emergency department 1 week ago but was unable to.    She denies trauma, cough, fever.  History was obtained via the patient and review of external medical notes including chest x-ray from outside facility in late June 2023 with a read for partially collapsed right lung, waxing and waning compared to prior- "Persistent right middle lobe collapse, waxing and waning on multiple  priors dating back to 2018. No new opacities. " On 10/25/2021        Physical Exam   Triage Vital Signs: ED Triage Vitals  Enc Vitals Group     BP 01/23/22 2234 (!) 181/81     Pulse Rate 01/23/22 2234 92     Resp 01/23/22 2234 18     Temp 01/23/22 2234 98.5 F (36.9 C)     Temp Source 01/23/22 2234 Oral     SpO2 01/23/22 2234 91 %     Weight --      Height --      Head Circumference --      Peak Flow --      Pain Score 01/23/22 2232 9     Pain Loc --      Pain Edu? --      Excl. in [place]? --     Most recent vital signs: Vitals:   01/24/22 0256 01/24/22 0355  BP:  (!) 161/80  Pulse:  87  Resp:  18  Temp: 98.5 F (36.9 C)   SpO2:  92%    General: Awake, no distress.  CV:  Good peripheral perfusion.  Normal rate and rhythm Resp:  Normal effort.  No wheezing, decreased breath sounds on the right side Abd:  No distention.  Nontender Other:  No respiratory distress.  No hypoxia on room  air   ED Results / Procedures / Treatments   Labs (all labs ordered are listed, but only abnormal results are displayed) Labs Reviewed  COMPREHENSIVE METABOLIC PANEL - Abnormal; Notable for the following components:      Result Value   Glucose, Bld 118 (*)    Calcium 8.8 (*)    Alkaline Phosphatase 134 (*)    All other components within normal limits  CBC WITH DIFFERENTIAL/PLATELET - Abnormal; Notable for the following components:   RBC 5.18 (*)    Hemoglobin 11.1 (*)    HCT 35.4 (*)    MCV 68.3 (*)    MCH 21.4 (*)    RDW 19.1 (*)    Platelets 92 (*)    All other components within normal limits  BRAIN NATRIURETIC PEPTIDE  CBC WITH DIFFERENTIAL/PLATELET  TROPONIN I (HIGH SENSITIVITY)  TROPONIN I (HIGH SENSITIVITY)     I reviewed labs and they are notable for nl Cr 0.6  EKG  ED ECG REPORT I, Pilar Jarvis, the attending physician, personally viewed and interpreted this ECG.  Date: 01/24/2022  EKG Time: 2230   Rate: 91  Rhythm: RBBB no stemi   RADIOLOGY I independently reviewed and interpreted CXR consistent w R pleural effusion   PROCEDURES:  Critical Care performed: No  Procedures   MEDICATIONS ORDERED IN ED: Medications  acetaminophen (TYLENOL) tablet 1,000 mg (1,000 mg Oral Not Given 01/24/22 0356)  amoxicillin-clavulanate (AUGMENTIN) 875-125 MG per tablet 1 tablet (has no administration in time range)  doxycycline (VIBRA-TABS) tablet 100 mg (has no administration in time range)  albuterol (VENTOLIN HFA) 108 (90 Base) MCG/ACT inhaler 2 puff (has no administration in time range)  ketorolac (TORADOL) 15 MG/ML injection 15 mg (15 mg Intravenous Given 01/24/22 0353)  iohexol (OMNIPAQUE) 350 MG/ML injection 75 mL (75 mLs Intravenous Contrast Given 01/24/22 0339)     IMPRESSION / MDM / ASSESSMENT AND PLAN / ED COURSE  I reviewed the triage vital signs and the nursing notes.                              Differential diagnosis includes, but is not limited to,  R pleural effusion, malignancy, PE, ACS, infection.  Doubt intra-abdominal, given no tenderness on palpation on my exam, LFTs normal.   The patient is on the cardiac monitor to evaluate for evidence of arrhythmia and/or significant heart rate changes.  MDM:  CXR w effusion which is small and pt stable. R pleuritic pain and dyspnea getting CTA r/o PE. Trop low on first, repeat to assess cardiac ischemia.   Trops flat CTA neg for PE but show evidence of pneumonia Pt stable nontoxic in ED no fever no hypotension and no hypoxia.   I discussed with the patient vision for admission versus discharge home, offered admission for pneumonia but patient defers stating that she would prefer to be discharged home with outpatient antibiotics and will have close observation of her symptoms at home and return to the emergency department with worsening.  She states that she will follow-up with PMD closely this week.  Patient's presentation is most consistent with acute presentation with potential threat to life or bodily function.       FINAL CLINICAL IMPRESSION(S) / ED DIAGNOSES   Final diagnoses:  Dyspnea, unspecified type  Community acquired pneumonia of right middle lobe of lung     Rx / DC Orders   ED Discharge Orders          Ordered    amoxicillin-clavulanate (AUGMENTIN) 875-125 MG tablet  2 times daily        01/24/22 0417    doxycycline (ADOXA) 100 MG tablet  2 times daily        01/24/22 0417             Note:  This document was prepared using Dragon voice recognition software and may include unintentional dictation errors.    Lucillie Garfinkel, MD 01/24/22 [phone]

## 2022-02-19 NOTE — Progress Notes (Signed)
While patient was not in any distress she did look a little anxious.  Cirrhosis is a risk factor for low blood sugar so I wanted to make sure that she was not anxious from that and I imagine that is the reason that I got the CBG.  Unfortunately I cannot access the records of the orders to see when that was ordered.

## 2022-02-23 ENCOUNTER — Emergency Department (HOSPITAL_COMMUNITY): Payer: Medicare HMO

## 2022-02-23 ENCOUNTER — Encounter (HOSPITAL_COMMUNITY): Payer: Self-pay

## 2022-02-23 ENCOUNTER — Observation Stay (HOSPITAL_COMMUNITY)
Admission: EM | Admit: 2022-02-23 | Discharge: 2022-02-24 | Disposition: A | Discharge status: Home / Self Care | Payer: Medicare HMO | Attending: Internal Medicine | Admitting: Internal Medicine

## 2022-02-23 DIAGNOSIS — I509 Heart failure, unspecified: Secondary | ICD-10-CM | POA: Insufficient documentation

## 2022-02-23 DIAGNOSIS — F17210 Nicotine dependence, cigarettes, uncomplicated: Secondary | ICD-10-CM | POA: Diagnosis not present

## 2022-02-23 DIAGNOSIS — J9621 Acute and chronic respiratory failure with hypoxia: Secondary | ICD-10-CM

## 2022-02-23 DIAGNOSIS — I25110 Atherosclerotic heart disease of native coronary artery with unstable angina pectoris: Secondary | ICD-10-CM | POA: Insufficient documentation

## 2022-02-23 DIAGNOSIS — Z79899 Other long term (current) drug therapy: Secondary | ICD-10-CM | POA: Insufficient documentation

## 2022-02-23 DIAGNOSIS — E876 Hypokalemia: Secondary | ICD-10-CM | POA: Diagnosis not present

## 2022-02-23 DIAGNOSIS — F17200 Nicotine dependence, unspecified, uncomplicated: Secondary | ICD-10-CM

## 2022-02-23 DIAGNOSIS — F1910 Other psychoactive substance abuse, uncomplicated: Secondary | ICD-10-CM

## 2022-02-23 DIAGNOSIS — Z7982 Long term (current) use of aspirin: Secondary | ICD-10-CM | POA: Insufficient documentation

## 2022-02-23 DIAGNOSIS — Z91040 Latex allergy status: Secondary | ICD-10-CM | POA: Insufficient documentation

## 2022-02-23 DIAGNOSIS — R072 Precordial pain: Principal | ICD-10-CM | POA: Insufficient documentation

## 2022-02-23 DIAGNOSIS — I10 Essential (primary) hypertension: Secondary | ICD-10-CM

## 2022-02-23 DIAGNOSIS — K219 Gastro-esophageal reflux disease without esophagitis: Secondary | ICD-10-CM

## 2022-02-23 DIAGNOSIS — J449 Chronic obstructive pulmonary disease, unspecified: Secondary | ICD-10-CM | POA: Diagnosis not present

## 2022-02-23 DIAGNOSIS — I110 Hypertensive heart disease with heart failure: Secondary | ICD-10-CM | POA: Diagnosis not present

## 2022-02-23 DIAGNOSIS — R079 Chest pain, unspecified: Secondary | ICD-10-CM | POA: Diagnosis present

## 2022-02-23 DIAGNOSIS — R0902 Hypoxemia: Secondary | ICD-10-CM

## 2022-02-23 DIAGNOSIS — Z951 Presence of aortocoronary bypass graft: Secondary | ICD-10-CM | POA: Diagnosis not present

## 2022-02-23 DIAGNOSIS — E119 Type 2 diabetes mellitus without complications: Secondary | ICD-10-CM | POA: Diagnosis not present

## 2022-02-23 DIAGNOSIS — J439 Emphysema, unspecified: Secondary | ICD-10-CM | POA: Diagnosis present

## 2022-02-23 LAB — CBC
HCT: 36.6 % (ref 36.0–46.0)
Hemoglobin: 11.6 g/dL — ABNORMAL LOW (ref 12.0–15.0)
MCH: 21.5 pg — ABNORMAL LOW (ref 26.0–34.0)
MCHC: 31.7 g/dL (ref 30.0–36.0)
MCV: 67.9 fL — ABNORMAL LOW (ref 80.0–100.0)
Platelets: 103 10*3/uL — ABNORMAL LOW (ref 150–400)
RBC: 5.39 MIL/uL — ABNORMAL HIGH (ref 3.87–5.11)
RDW: 18.9 % — ABNORMAL HIGH (ref 11.5–15.5)
WBC: 5.6 10*3/uL (ref 4.0–10.5)
nRBC: 0 % (ref 0.0–0.2)

## 2022-02-23 LAB — BASIC METABOLIC PANEL
Anion gap: 8 (ref 5–15)
BUN: 6 mg/dL — ABNORMAL LOW (ref 8–23)
CO2: 25 mmol/L (ref 22–32)
Calcium: 9.1 mg/dL (ref 8.9–10.3)
Chloride: 105 mmol/L (ref 98–111)
Creatinine, Ser: 0.58 mg/dL (ref 0.44–1.00)
GFR, Estimated: >60 mL/min (ref >60)
Glucose, Bld: 127 mg/dL — ABNORMAL HIGH (ref 70–99)
Potassium: 2.7 mmol/L — CL (ref 3.5–5.1)
Sodium: 138 mmol/L (ref 135–145)

## 2022-02-23 LAB — TROPONIN I (HIGH SENSITIVITY)
Troponin I (High Sensitivity): 8 ng/L (ref <18)
Troponin I (High Sensitivity): 8 ng/L (ref <18)

## 2022-02-23 MED ORDER — NITROGLYCERIN 0.4 MG SL SUBL
0.4000 mg | SUBLINGUAL_TABLET | Freq: Once | SUBLINGUAL | Status: AC
  Administered 2022-02-23: 0.4 mg via SUBLINGUAL
  Filled 2022-02-23: qty 1

## 2022-02-23 MED ORDER — POTASSIUM CHLORIDE 20 MEQ PO PACK
40.0000 meq | PACK | Freq: Once | ORAL | Status: AC
  Administered 2022-02-23: 40 meq via ORAL
  Filled 2022-02-23: qty 2

## 2022-02-23 MED ORDER — LORAZEPAM 1 MG PO TABS
1.0000 mg | ORAL_TABLET | Freq: Once | ORAL | Status: AC
  Administered 2022-02-23: 1 mg via ORAL
  Filled 2022-02-23: qty 1

## 2022-02-23 MED ORDER — IOHEXOL 350 MG/ML SOLN
100.0000 mL | Freq: Once | INTRAVENOUS | Status: AC | PRN
  Administered 2022-02-23: 100 mL via INTRAVENOUS

## 2022-02-23 MED ORDER — MORPHINE SULFATE (PF) 4 MG/ML IV SOLN
4.0000 mg | Freq: Once | INTRAVENOUS | Status: AC
  Administered 2022-02-23: 4 mg via INTRAVENOUS
  Filled 2022-02-23: qty 1

## 2022-02-23 MED ORDER — POTASSIUM CHLORIDE 10 MEQ/100ML IV SOLN
10.0000 meq | INTRAVENOUS | Status: AC
  Administered 2022-02-23 (×3): 10 meq via INTRAVENOUS
  Filled 2022-02-23 (×3): qty 100

## 2022-02-23 NOTE — ED Triage Notes (Addendum)
Pt arrives from home via [facility] c/o chest pain. EMS gave 1 nitro and a baby aspirin for the chest pain and BP of 200/102, reports BP improved but went back up to 194/102 prior to arrival. Reports she has not taken her BP medication for 5 days.

## 2022-02-23 NOTE — ED Provider Notes (Signed)
[facility] EMERGENCY DEPARTMENT Provider Note   CSN: [id number] Arrival date & time: 02/23/22  1427     History  Chief Complaint  Patient presents with   Chest Pain    HUBERTA TOMPKINS is a 61 y.o. female past medical history significant for unstable angina, previous CHF, COPD, cirrhosis, diabetes, hepatitis C, coronary artery disease, previous ACS, previous open heart surgery who presents with concern for severe sternal chest pain, worse for the last week, but with acute worsening today with exertion.  Patient reports that she went to walk to her friend's house when she began significant chest pain.  Patient reports that she has not had her blood pressure medication for 5 days.  She denies nausea, vomiting.     Chest Pain      Home Medications Prior to Admission medications   Medication Sig Start Date End Date Taking? Authorizing Provider  aspirin EC 81 MG tablet Take 81 mg by mouth daily.   Yes [provider]  carvedilol (COREG) 3.125 MG tablet Take 3.125 mg by mouth 2 (two) times daily. 11/21/21  Yes [provider]  diphenhydrAMINE (BENADRYL) 50 MG capsule Take 50 mg by mouth every 6 (six) hours as needed.   Yes [provider]  hydrOXYzine (ATARAX) 25 MG tablet Take 25 mg by mouth 3 (three) times daily. 12/26/21  Yes [provider]  pantoprazole (PROTONIX) 40 MG tablet Take 40 mg by mouth 2 (two) times daily before a meal.   Yes [provider]  pregabalin (LYRICA) 300 MG capsule Take 300 mg by mouth 2 (two) times daily.   Yes [provider]  venlafaxine XR (EFFEXOR-XR) 150 MG 24 hr capsule Take 150 mg by mouth daily. 11/21/21  Yes [provider]  albuterol (VENTOLIN HFA) 108 (90 Base) MCG/ACT inhaler Inhale 1-2 puffs into the lungs every 4 (four) hours as needed for wheezing or shortness of breath. Patient not taking: Reported on 02/23/2022 01/10/22   [provider]      Allergies    Aspirin,  Tramadol, Acetaminophen, Latex, Codeine, and Vicodin [hydrocodone-acetaminophen]    Review of Systems   Review of Systems  Cardiovascular:  Positive for chest pain.  All other systems reviewed and are negative.   Physical Exam Updated Vital Signs BP (!) 159/72   Pulse 83   Temp 98.4 F (36.9 C) (Oral)   Resp 19   Ht 5\' 4"  (1.626 m)   Wt 74.8 kg   SpO2 98%   BMI 28.32 kg/m  Physical Exam Vitals and nursing note reviewed.  Constitutional:      General: She is in acute distress.     Appearance: Normal appearance.  HENT:     Head: Normocephalic and atraumatic.  Eyes:     General:        Right eye: No discharge.        Left eye: No discharge.  Cardiovascular:     Rate and Rhythm: Normal rate and regular rhythm.     Heart sounds: No murmur heard.    No friction rub. No gallop.  Pulmonary:     Effort: Pulmonary effort is normal.     Breath sounds: Normal breath sounds.  Chest:     Comments: Patient with signs of large open heart surgery, with missing portion of sternum, pulsating mass noted in center of chest Abdominal:     General: Bowel sounds are normal.     Palpations: Abdomen is soft.  Skin:  General: Skin is warm and dry.     Capillary Refill: Capillary refill takes less than 2 seconds.  Neurological:     Mental Status: She is alert and oriented to person, place, and time.  Psychiatric:        Mood and Affect: Mood normal.        Behavior: Behavior normal.     ED Results / Procedures / Treatments   Labs (all labs ordered are listed, but only abnormal results are displayed) Labs Reviewed  BASIC METABOLIC PANEL - Abnormal; Notable for the following components:      Result Value   Potassium 2.7 (*)    Glucose, Bld 127 (*)    BUN 6 (*)    All other components within normal limits  CBC - Abnormal; Notable for the following components:   RBC 5.39 (*)    Hemoglobin 11.6 (*)    MCV 67.9 (*)    MCH 21.5 (*)    RDW 18.9 (*)    Platelets 103 (*)    All  other components within normal limits  TROPONIN I (HIGH SENSITIVITY)  TROPONIN I (HIGH SENSITIVITY)    EKG None  Radiology CT Angio Chest/Abd/Pel for Dissection W and/or Wo Contrast  Result Date: 02/23/2022 CLINICAL DATA:  Chest pain, hypertension EXAM: CT ANGIOGRAPHY CHEST, ABDOMEN AND PELVIS TECHNIQUE: Non-contrast CT of the chest was initially obtained. Multidetector CT imaging through the chest, abdomen and pelvis was performed using the standard protocol during bolus administration of intravenous contrast. Multiplanar reconstructed images and MIPs were obtained and reviewed to evaluate the vascular anatomy. RADIATION DOSE REDUCTION: This exam was performed according to the departmental dose-optimization program which includes automated exposure control, adjustment of the mA and/or kV according to patient size and/or use of iterative reconstruction technique. CONTRAST:  134mL OMNIPAQUE IOHEXOL 350 MG/ML SOLN COMPARISON:  02/23/2022, 01/24/2022 FINDINGS: CTA CHEST FINDINGS Cardiovascular: No evidence of thoracic aortic aneurysm or dissection. 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 The great vessels are widely patent. The heart is unremarkable without pericardial effusion. Postsurgical changes from previous CABG. Atherosclerosis of the native coronary vessels. Mediastinum/Nodes: No enlarged mediastinal, hilar, or axillary lymph nodes. Thyroid gland, trachea, and esophagus demonstrate no significant findings. Lungs/Pleura: No acute airspace disease, effusion, or pneumothorax. Chronic areas of atelectasis involving the lingula and right middle lobe. Central airways are patent. Musculoskeletal: Postsurgical changes from prior median sternotomy, with chronic sternal defect. The subxiphoid hernia seen previously contains fat. Previously herniated transverse colon has been reduced, with no bowel herniation at this time. Reconstructed images demonstrate no  additional findings. Review of the MIP images confirms the above findings. CTA ABDOMEN AND PELVIS FINDINGS VASCULAR Aorta: Normal caliber aorta without aneurysm, dissection, vasculitis or significant stenosis. Diffuse aortic atherosclerosis. Celiac: Patent without evidence of aneurysm, dissection, vasculitis or significant stenosis. SMA: Patent without evidence of aneurysm, dissection, vasculitis or significant stenosis. Renals: Both renal arteries are patent without evidence of aneurysm, dissection, vasculitis, fibromuscular dysplasia or significant stenosis. IMA: Patent without evidence of aneurysm, dissection, vasculitis or significant stenosis. Inflow: Patent without evidence of aneurysm, dissection, vasculitis or significant stenosis. Veins: Dilated splenic and main portal vein consistent with portal venous hypertension. Splenic and gastric varices are noted. Review of the MIP images confirms the above findings. NON-VASCULAR Hepatobiliary: Cirrhotic morphology of the liver. No focal parenchymal abnormality. Cholecystectomy. No biliary duct dilation. Pancreas: Unremarkable. No pancreatic ductal dilatation or surrounding inflammatory changes. Spleen: Splenomegaly, consistent with portal venous hypertension.  No focal parenchymal abnormality. Adrenals/Urinary Tract: Adrenal glands are unremarkable. Kidneys are normal, without renal calculi, focal lesion, or hydronephrosis. Bladder is unremarkable. Excreted contrast is seen within the ureters and bladder. Stomach/Bowel: No bowel obstruction or ileus. No bowel wall thickening or inflammatory change. Lymphatic: No pathologic adenopathy within the abdomen or pelvis. Reproductive: Uterus and bilateral adnexa are unremarkable. Other: No free fluid or free intraperitoneal gas. Subxiphoid ventral hernia as described under the CT chest portion of the exam. No other abdominal wall hernia. Musculoskeletal: There are no acute or destructive bony lesions. Postsurgical changes  lower lumbar spine. Reconstructed images demonstrate no additional findings. Review of the MIP images confirms the above findings. IMPRESSION: Vascular: 1. No evidence of thoracoabdominal aortic aneurysm or dissection. 2. Portal venous hypertension, with splenic and gastric varices as above. 3.  Aortic Atherosclerosis (ICD10-I70.0). Nonvascular: 1. Cirrhosis, with portal venous hypertension manifested by varices and splenomegaly as above. 2. Subxiphoid fat containing ventral hernia. The herniated bowel seen previously has been reduced in the interim. Electronically Signed   By: Sharlet Salina M.D.   On: 02/23/2022 17:36   DG Chest 2 View  Result Date: 02/23/2022 CLINICAL DATA:  Chest pain. EXAM: CHEST - 2 VIEW COMPARISON:  Chest two views 01/23/2022, chest two views 05/24/2021; CT chest 01/24/2022 FINDINGS: Cardiac silhouette is again mildly to moderately enlarged. Surgical clips again overlie the left mediastinum. Mediastinal contours are within normal limits. Left midlung horizontal linear likely subsegmental atelectasis versus scarring. Mildly increased lucencies again compatible with chronic emphysematous changes within the predominantly upper lungs. No pleural effusion or pneumothorax. Mild multilevel degenerative disc changes of the thoracic spine. IMPRESSION: 1. Resolution of the prior small right pleural effusion. 2. On prior 01/24/2022 CT and 01/23/2022 radiograph, a midline substernal ventral upper abdominal hernia was seen with mesenteric fat and nonobstructed colon extending ventrally. This was also seen on frontal and lateral radiographs 01/23/2022. Herniated bowel in this region is not visualized on the current study. Electronically Signed   By: Neita Garnet M.D.   On: 02/23/2022 15:09    Procedures .Critical Care  Performed by: Olene Floss, PA-C Authorized by: Olene Floss, PA-C   Critical care provider statement:    Critical care time (minutes):  35   Critical care  was necessary to treat or prevent imminent or life-threatening deterioration of the following conditions:  Metabolic crisis   Critical care was time spent personally by me on the following activities:  Development of treatment plan with patient or surrogate, discussions with consultants, evaluation of patient's response to treatment, examination of patient, ordering and review of laboratory studies, ordering and review of radiographic studies, ordering and performing treatments and interventions, pulse oximetry, re-evaluation of patient's condition and review of old charts   Care discussed with: admitting provider       Medications Ordered in ED Medications  nitroGLYCERIN (NITROSTAT) SL tablet 0.4 mg (0.4 mg Sublingual Given 02/23/22 1526)  morphine (PF) 4 MG/ML injection 4 mg (4 mg Intravenous Given 02/23/22 1546)  potassium chloride (KLOR-CON) packet 40 mEq (40 mEq Oral Given 02/23/22 1614)  potassium chloride 10 mEq in 100 mL IVPB (0 mEq Intravenous Stopped 02/23/22 1934)  iohexol (OMNIPAQUE) 350 MG/ML injection 100 mL (100 mLs Intravenous Contrast Given 02/23/22 1703)  LORazepam (ATIVAN) tablet 1 mg (1 mg Oral Given 02/23/22 1753)    ED Course/ Medical Decision Making/ A&P Clinical Course as of 02/23/22 2313  Thu Feb 23, 2022  1854 Custovic [CP]    Clinical  Course User Index [CP] Olene Floss, PA-C                           Medical Decision Making Amount and/or Complexity of Data Reviewed Labs: ordered. Radiology: ordered.  Risk Prescription drug management. Decision regarding hospitalization.   This patient is a 61 y.o. female who presents to the ED for concern of chest pain, this involves an extensive number of treatment options, and is a complaint that carries with it a high risk of complications and morbidity. The emergent differential diagnosis prior to evaluation includes, but is not limited to,  ACS, AAS, PE, Mallory-Weiss, Boerhaave's, Pneumonia, acute  bronchitis, asthma or COPD exacerbation, anxiety, MSK pain or traumatic injury to the chest, acid reflux versus other .   This is not an exhaustive differential.   Past Medical History / Co-morbidities / Social History: Coronary artery disease, CABG, sternal staph infection requiring pectoralis flap, diabetes, hypertension, hepatitis C with cirrhosis, polysubstance abuse, COPD, degenerative disc disease, hypothyroidism  Additional history: Chart reviewed. Pertinent results include: Sensibly reviewed outpatient evaluations with cardiology, cardiac surgery with [facility], patient sees Dr. Kennon Rounds  Physical Exam: Physical exam performed. The pertinent findings include: Patient is chronically ill-appearing, she has a large scar over the sternum from her previous MSSA sternal infection requiring flap, and CABG  Lab Tests: I ordered, and personally interpreted labs.  The pertinent results include: Hypokalemia, potassium 2.7 requiring IV potassium, glucose minimally elevated 127.  CBC notable for mild anemia, hemoglobin 11.6, no clinically significant leukocytosis, she does have considerable microcytic quality to her anemia.  Her troponin is negative x2.   Imaging Studies: I ordered imaging studies including CT abdomen pelvis dissection study, plain film chest x-ray. I independently visualized and interpreted imaging which showed no consistent acute intrathoracic abnormality, dissection, PE. I agree with the radiologist interpretation.   Cardiac Monitoring:  The patient was maintained on a cardiac monitor.  My attending physician Dr. Posey Rea viewed and interpreted the cardiac monitored which showed an underlying rhythm of: NSR. I agree with this interpretation.   Medications: I ordered medication including IV and oral potassium, Ativan, morphine, nitro for chest pain, anxiety, hypokalemia. Reevaluation of the patient after these medicines showed that the patient improved. I have reviewed the patients home  medicines and have made adjustments as needed.  Consultations Obtained: I requested consultation with the cardiologist, spoke with Dr. Melton Alar, as well as hospitalist, spoke with Dr. Carren Rang,  and discussed lab and imaging findings as well as pertinent plan - they recommend: Cardiology recommends admission at [facility] due to her significant previous cardiac history, hospitalist agrees to admission at this time   Disposition: After consideration of the diagnostic results and the patients response to treatment, I feel that she would [facility] as discussed above.   I discussed this case with my attending physician Dr. Posey Rea who cosigned this note including patient's presenting symptoms, physical exam, and planned diagnostics and interventions. Attending physician stated agreement with plan or made changes to plan which were implemented.    Final Clinical Impression(s) / ED Diagnoses Final diagnoses:  None    Rx / DC Orders ED Discharge Orders     None         [place] 02/23/22 2313    Glendora Score, MD 02/24/22 1332

## 2022-02-23 NOTE — ED Notes (Addendum)
CRITICAL VALUE STICKER  CRITICAL VALUE: K+ 2.7  MD NOTIFIED: Dr. Matilde Sprang  TIME OF NOTIFICATION: [phone]

## 2022-02-24 ENCOUNTER — Other Ambulatory Visit (HOSPITAL_COMMUNITY): Payer: Self-pay | Admitting: *Deleted

## 2022-02-24 ENCOUNTER — Other Ambulatory Visit (HOSPITAL_COMMUNITY): Payer: Medicare HMO

## 2022-02-24 ENCOUNTER — Observation Stay (HOSPITAL_BASED_OUTPATIENT_CLINIC_OR_DEPARTMENT_OTHER): Payer: Medicare HMO

## 2022-02-24 DIAGNOSIS — R079 Chest pain, unspecified: Secondary | ICD-10-CM

## 2022-02-24 DIAGNOSIS — J9621 Acute and chronic respiratory failure with hypoxia: Secondary | ICD-10-CM

## 2022-02-24 DIAGNOSIS — E785 Hyperlipidemia, unspecified: Secondary | ICD-10-CM | POA: Diagnosis not present

## 2022-02-24 DIAGNOSIS — J439 Emphysema, unspecified: Secondary | ICD-10-CM | POA: Diagnosis present

## 2022-02-24 DIAGNOSIS — F17200 Nicotine dependence, unspecified, uncomplicated: Secondary | ICD-10-CM

## 2022-02-24 DIAGNOSIS — E876 Hypokalemia: Secondary | ICD-10-CM | POA: Diagnosis not present

## 2022-02-24 DIAGNOSIS — K219 Gastro-esophageal reflux disease without esophagitis: Secondary | ICD-10-CM

## 2022-02-24 DIAGNOSIS — I10 Essential (primary) hypertension: Secondary | ICD-10-CM

## 2022-02-24 DIAGNOSIS — F1910 Other psychoactive substance abuse, uncomplicated: Secondary | ICD-10-CM

## 2022-02-24 LAB — COMPREHENSIVE METABOLIC PANEL
ALT: 30 U/L (ref 0–44)
AST: 46 U/L — ABNORMAL HIGH (ref 15–41)
Albumin: 3.3 g/dL — ABNORMAL LOW (ref 3.5–5.0)
Alkaline Phosphatase: 95 U/L (ref 38–126)
Anion gap: 7 (ref 5–15)
BUN: <5 mg/dL — ABNORMAL LOW (ref 8–23)
CO2: 25 mmol/L (ref 22–32)
Calcium: 8.8 mg/dL — ABNORMAL LOW (ref 8.9–10.3)
Chloride: 108 mmol/L (ref 98–111)
Creatinine, Ser: 0.43 mg/dL — ABNORMAL LOW (ref 0.44–1.00)
GFR, Estimated: >60 mL/min (ref >60)
Glucose, Bld: 89 mg/dL (ref 70–99)
Potassium: 3.3 mmol/L — ABNORMAL LOW (ref 3.5–5.1)
Sodium: 140 mmol/L (ref 135–145)
Total Bilirubin: 1 mg/dL (ref 0.3–1.2)
Total Protein: 7.2 g/dL (ref 6.5–8.1)

## 2022-02-24 LAB — LIPID PANEL
Cholesterol: 135 mg/dL (ref 0–200)
HDL: 27 mg/dL — ABNORMAL LOW (ref >40)
LDL Cholesterol: 91 mg/dL (ref 0–99)
Total CHOL/HDL Ratio: 5 RATIO
Triglycerides: 87 mg/dL (ref <150)
VLDL: 17 mg/dL (ref 0–40)

## 2022-02-24 LAB — HIV ANTIBODY (ROUTINE TESTING W REFLEX): HIV Screen 4th Generation wRfx: NONREACTIVE

## 2022-02-24 LAB — CBC
HCT: 34.7 % — ABNORMAL LOW (ref 36.0–46.0)
Hemoglobin: 10.9 g/dL — ABNORMAL LOW (ref 12.0–15.0)
MCH: 21.4 pg — ABNORMAL LOW (ref 26.0–34.0)
MCHC: 31.4 g/dL (ref 30.0–36.0)
MCV: 68.2 fL — ABNORMAL LOW (ref 80.0–100.0)
Platelets: 95 10*3/uL — ABNORMAL LOW (ref 150–400)
RBC: 5.09 MIL/uL (ref 3.87–5.11)
RDW: 18 % — ABNORMAL HIGH (ref 11.5–15.5)
WBC: 4.7 10*3/uL (ref 4.0–10.5)
nRBC: 0 % (ref 0.0–0.2)

## 2022-02-24 LAB — MAGNESIUM: Magnesium: 1.9 mg/dL (ref 1.7–2.4)

## 2022-02-24 LAB — ECHOCARDIOGRAM LIMITED
Height: 64 in
S' Lateral: 1.6 cm
Weight: 2640 oz

## 2022-02-24 LAB — RAPID URINE DRUG SCREEN, HOSP PERFORMED
Amphetamines: NOT DETECTED
Barbiturates: NOT DETECTED
Benzodiazepines: NOT DETECTED
Cocaine: POSITIVE — AB
Opiates: POSITIVE — AB
Tetrahydrocannabinol: NOT DETECTED

## 2022-02-24 LAB — TROPONIN I (HIGH SENSITIVITY): Troponin I (High Sensitivity): 8 ng/L (ref <18)

## 2022-02-24 MED ORDER — PREGABALIN 75 MG PO CAPS
300.0000 mg | ORAL_CAPSULE | Freq: Two times a day (BID) | ORAL | Status: DC
  Administered 2022-02-24 (×2): 300 mg via ORAL
  Filled 2022-02-24 (×2): qty 4

## 2022-02-24 MED ORDER — AMLODIPINE BESYLATE 5 MG PO TABS
5.0000 mg | ORAL_TABLET | Freq: Every day | ORAL | Status: DC
  Administered 2022-02-24: 5 mg via ORAL
  Filled 2022-02-24: qty 1

## 2022-02-24 MED ORDER — CARVEDILOL 3.125 MG PO TABS
3.1250 mg | ORAL_TABLET | Freq: Two times a day (BID) | ORAL | Status: DC
  Administered 2022-02-24: 3.125 mg via ORAL
  Filled 2022-02-24: qty 1

## 2022-02-24 MED ORDER — POTASSIUM CHLORIDE IN NACL 40-0.9 MEQ/L-% IV SOLN
INTRAVENOUS | Status: DC
  Filled 2022-02-24 (×4): qty 1000

## 2022-02-24 MED ORDER — ACETAMINOPHEN 325 MG PO TABS: 650.0000 mg | ORAL_TABLET | ORAL | Status: DC | PRN

## 2022-02-24 MED ORDER — HEPARIN SODIUM (PORCINE) 5000 UNIT/ML IJ SOLN
5000.0000 [IU] | Freq: Three times a day (TID) | INTRAMUSCULAR | Status: DC
  Administered 2022-02-24 (×2): 5000 [IU] via SUBCUTANEOUS
  Filled 2022-02-24 (×2): qty 1

## 2022-02-24 MED ORDER — AMLODIPINE BESYLATE 5 MG PO TABS: 5.0000 mg | ORAL_TABLET | Freq: Every day | ORAL | 0 refills | Status: DC

## 2022-02-24 MED ORDER — HYDRALAZINE HCL 20 MG/ML IJ SOLN: 10.0000 mg | Freq: Four times a day (QID) | INTRAMUSCULAR | Status: DC | PRN

## 2022-02-24 MED ORDER — ONDANSETRON HCL 4 MG/2ML IJ SOLN: 4.0000 mg | Freq: Four times a day (QID) | INTRAMUSCULAR | Status: DC | PRN

## 2022-02-24 MED ORDER — ASPIRIN 81 MG PO TBEC
81.0000 mg | DELAYED_RELEASE_TABLET | Freq: Every day | ORAL | Status: DC
  Administered 2022-02-24: 81 mg via ORAL
  Filled 2022-02-24: qty 1

## 2022-02-24 MED ORDER — ROSUVASTATIN CALCIUM 20 MG PO TABS: 20.0000 mg | ORAL_TABLET | Freq: Every day | ORAL | Status: DC

## 2022-02-24 MED ORDER — PANTOPRAZOLE SODIUM 40 MG PO TBEC
40.0000 mg | DELAYED_RELEASE_TABLET | Freq: Two times a day (BID) | ORAL | Status: DC
  Administered 2022-02-24: 40 mg via ORAL
  Filled 2022-02-24: qty 1

## 2022-02-24 MED ORDER — OXYCODONE HCL 5 MG PO TABS
5.0000 mg | ORAL_TABLET | ORAL | Status: DC | PRN
  Administered 2022-02-24: 5 mg via ORAL
  Filled 2022-02-24: qty 1

## 2022-02-24 MED ORDER — IPRATROPIUM-ALBUTEROL 0.5-2.5 (3) MG/3ML IN SOLN
3.0000 mL | Freq: Once | RESPIRATORY_TRACT | Status: AC
  Administered 2022-02-24: 3 mL via RESPIRATORY_TRACT
  Filled 2022-02-24: qty 3

## 2022-02-24 MED ORDER — VENLAFAXINE HCL ER 37.5 MG PO CP24
150.0000 mg | ORAL_CAPSULE | Freq: Every day | ORAL | Status: DC
  Administered 2022-02-24: 150 mg via ORAL
  Filled 2022-02-24: qty 4

## 2022-02-24 MED ORDER — POTASSIUM CHLORIDE CRYS ER 20 MEQ PO TBCR
40.0000 meq | EXTENDED_RELEASE_TABLET | Freq: Once | ORAL | Status: AC
  Administered 2022-02-24: 40 meq via ORAL
  Filled 2022-02-24: qty 2

## 2022-02-24 MED ORDER — ROSUVASTATIN CALCIUM 10 MG PO TABS
10.0000 mg | ORAL_TABLET | Freq: Every day | ORAL | Status: DC
  Administered 2022-02-24: 10 mg via ORAL
  Filled 2022-02-24: qty 1

## 2022-02-24 MED ORDER — MORPHINE SULFATE (PF) 2 MG/ML IV SOLN
2.0000 mg | INTRAVENOUS | Status: DC | PRN
  Administered 2022-02-24 (×3): 2 mg via INTRAVENOUS
  Filled 2022-02-24 (×3): qty 1

## 2022-02-24 NOTE — Assessment & Plan Note (Signed)
-   Hold beta-blocker in the setting of recent cocaine use - Blood pressure significantly elevated in the ED of 291/116 - As needed hydralazine

## 2022-02-24 NOTE — TOC Progression Note (Signed)
Transition of Care [facility]) - Progression Note    Patient Details  Name: Meghan Jimenez MRN: [id number] Date of Birth: [birth year]  [facility] CM/SW Contact  Boneta Lucks, RN Phone Number: 02/24/2022, 12:15 PM  Clinical Narrative:   Patient in ED, cocaine use. TOC consulted for Substance abuse. Patient is also needing nocturnal oxygen. [employer] is agreeable for order to be placed with home pulse oximetry to be ordered. MD ordered and signed. CM took oxygen to the beside and explain that [employer] will be provided over night pulse ox. [place] Number given.  TOC also consulted for Substance abuse. Resources given and added to AVS.  Patient is saying she does not have a ride home. RN is calling her daughter.RN will follow up for transportation home.     Expected Discharge Plan: Home/Self Care Barriers to Discharge: Continued Medical Work up  Expected Discharge Plan and Services Expected Discharge Plan: Home/Self Care

## 2022-02-24 NOTE — Assessment & Plan Note (Signed)
-   Potassium 2.7 - Total of 70 mEq given in the ED - Continue 40 mEq more throughout the night -Trend in the a.m. - Check magnesium in a.m.

## 2022-02-24 NOTE — ED Notes (Signed)
Pt's O2 sat is dropping down to 87% on RA when she's sleeping. When she awakens it goes up to about 90-93% on RA. Pt placed on 2L O2 via [place] while she is sleeping. O2 sats are now 94% after she went back to sleep. Dr. Roderic Palau notified via text page.

## 2022-02-24 NOTE — Assessment & Plan Note (Signed)
-   Admits to recent cocaine use - UDS pending - TOC consult for substance abuse counseling

## 2022-02-24 NOTE — H&P (Signed)
History and Physical    Patient: Meghan Jimenez JJK:[id number] DOB: Jun 23, 1960 DOA: 02/23/2022 DOS: the patient was seen and examined on 02/24/2022 PCP: [facility] [employer]  Patient coming from: Home  Chief Complaint:  Chief Complaint  Patient presents with   Chest Pain   HPI: Meghan Jimenez is a 61 y.o. female with medical history significant of coronary artery disease, CHF, liver cirrhosis, COPD, diabetes mellitus type 2, hepatitis C, hypertension presents the ED with a chief complaint of chest pain.  Patient reports she is "hurting bad."  She reports that the chest pain has been intermittent.  When it is there it is substernal and radiates to the left side of her chest and left neck.  It is sharp pain, shooting pain, and grabbing pain.  It lasts minutes not hours.  She is not sure if it is exertional or not.  She reports it is associated with dyspnea, which has limited her exertion recently.  She has been coughing and has been productive of green sputum.  She has not had any fevers.  Patient reports palpitations, dizziness, nausea, vomiting.  She reports no syncopal episodes but does states she fell down a week ago.  She did not hit her head.  She did not lose consciousness.  Patient reports that this episode feels worse than when she had a heart attack previously.  When describing her vomiting she reports there is no hematemesis or coffee-ground emesis.  She did have a nosebleed when EMS picked her up today and she reports that that was because her blood pressure was so high with a systolic blood pressure in the 200s.  Her last cocaine use was day before yesterday.  Patient does smoke half a pack per day.  She does not drink alcohol any longer.  She is vaccinated for COVID.  Patient is full code. Review of Systems: As mentioned in the history of present illness. All other systems reviewed and are negative. Past Medical History:  Diagnosis Date   CAD (coronary artery disease)     CHF (congestive heart failure) (HCC)    Cirrhosis (HCC)    COPD (chronic obstructive pulmonary disease) (HCC)    Diabetes mellitus without complication ([place])    Hepatitis C    Hypertension    Past Surgical History:  Procedure Laterality Date   BACK SURGERY     CARDIAC SURGERY     ESOPHAGOGASTRODUODENOSCOPY (EGD) WITH PROPOFOL N/A 12/11/2015   Procedure: ESOPHAGOGASTRODUODENOSCOPY (EGD) WITH PROPOFOL;  Surgeon: San Jetty, MD;  Location: [facility] END[facility]COPY;  Service: Gastroenterology;  Laterality: N/A;   LEFT HEART CATH AND CORONARY ANGIOGRAPHY N/A 11/13/2016   Procedure: Left Heart Cath and Coronary Angiography;  Surgeon: Corey Skains, MD;  Location: [facility];  Service: Cardiovascular;  Laterality: N/A;   Social History:  reports that she has been smoking cigarettes. She has been smoking an average of 1 pack per day. She has never used smokeless tobacco. She reports current alcohol use. She reports current drug use. Drug: "Crack" cocaine.  Allergies  Allergen Reactions   Aspirin Anaphylaxis and Palpitations    Patient takes ibuprofen as home med, so no problem with NSAIDs   Tramadol Anaphylaxis   Acetaminophen     Other reaction(s): Unknown   Latex    Codeine Rash   Vicodin [Hydrocodone-Acetaminophen] Palpitations    Family History  Problem Relation Age of Onset   CAD Mother    Diabetes Mother    Lung cancer Father  Prior to Admission medications   Medication Sig Start Date End Date Taking? Authorizing Provider  aspirin EC 81 MG tablet Take 81 mg by mouth daily.   Yes [provider]  carvedilol (COREG) 3.125 MG tablet Take 3.125 mg by mouth 2 (two) times daily. 11/21/21  Yes [provider]  diphenhydrAMINE (BENADRYL) 50 MG capsule Take 50 mg by mouth every 6 (six) hours as needed.   Yes [provider]  hydrOXYzine (ATARAX) 25 MG tablet Take 25 mg by mouth 3 (three) times daily. 12/26/21  Yes [provider]  pantoprazole  (PROTONIX) 40 MG tablet Take 40 mg by mouth 2 (two) times daily before a meal.   Yes [provider]  pregabalin (LYRICA) 300 MG capsule Take 300 mg by mouth 2 (two) times daily.   Yes [provider]  venlafaxine XR (EFFEXOR-XR) 150 MG 24 hr capsule Take 150 mg by mouth daily. 11/21/21  Yes [provider]  albuterol (VENTOLIN HFA) 108 (90 Base) MCG/ACT inhaler Inhale 1-2 puffs into the lungs every 4 (four) hours as needed for wheezing or shortness of breath. Patient not taking: Reported on 02/23/2022 01/10/22   [provider]    Physical Exam: Vitals:   02/23/22 1830 02/23/22 1835 02/23/22 2319 02/24/22 0058  BP: (!) 159/72  (!) 173/84 (!) 169/84  Pulse: 83  74 85  Resp: 19  15   Temp:  98.4 F (36.9 C) (!) 97.3 F (36.3 C)   TempSrc:  Oral Axillary   SpO2: 98%  97%   Weight:      Height:       1.  General: Patient lying supine in bed,  no acute distress   2. Psychiatric: Alert and oriented x 3, mood and behavior normal for situation, pleasant and cooperative with exam   3. Neurologic: Speech and language are normal, face is symmetric, moves all 4 extremities voluntarily, at baseline without acute deficits on limited exam   4. HEENMT:  Head is atraumatic, normocephalic, pupils reactive to light, neck is supple, trachea is midline, mucous membranes are moist   5. Respiratory : Diffuse chest tenderness with palpation, lungs are clear to auscultation bilaterally without wheezing, rhonchi, rales, no cyanosis, no increase in work of breathing or accessory muscle use   6. Cardiovascular : Heart rate normal, rhythm is regular, no murmurs, rubs or gallops, no peripheral edema, peripheral pulses palpated   7. Gastrointestinal:  Abdomen is soft, diffuse abdominal tenderness with guarding, bowel sounds active, no masses or organomegaly palpated   8. 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 Shellia Carwin was consulted from the ED and recommends admission to [facility] Admission requested for chest pain  Assessment and Plan: * Chest pain - With history  of coronary artery disease - This pain is worse than when she had a heart attack before - Likely related to cocaine use especially while being on a beta-blocker - Electrolyte derangements could also be contributing - CTA of the chest abdomen and pelvis shows no evidence of aneurysm or dissection, no PE noted - Cardiology was consulted from [facility] and recommends transfer to [facility] for cardiac ops - Troponins normal 8, 8 - Cycle troponin in the a.m. - Monitor on telemetry - EKG shows heart rate of 80, sinus rhythm, QTc 4 and 75 - Patient was treated with morphine and Nitrostat in the ED - Continue morphine as needed-if allergy is actually an intolerance - Continue to monitor  GERD (gastroesophageal reflux disease) - Continue PPI  Essential hypertension - Hold beta-blocker in the setting of recent cocaine use - Blood pressure significantly elevated in the ED of 291/116 - As needed hydralazine  Tobacco use disorder - Smokes cigarettes - Declines nicotine patch due to allergic  reaction to the adhesive  Substance abuse ([place]) - Admits to recent cocaine use - UDS pending - TOC consult for substance abuse counseling  Hypokalemia - Potassium 2.7 - Total of 70 mEq given in the ED - Continue 40 mEq more throughout the night -Trend in the a.m. - Check magnesium in a.m.      Advance Care Planning:   Code Status: Full Code  Consults: Cardiology consulted from the ED and recommends admission to [facility].  Family Communication: No family at bedside  Severity of Illness: The appropriate patient status for this patient is OBSERVATION. Observation status is judged to be reasonable and necessary in order to provide the required intensity of service to ensure the patient's safety. The patient's presenting symptoms, physical exam findings, and initial radiographic and laboratory data in the context of their medical condition is felt to place them at decreased risk for further clinical deterioration. Furthermore, it is anticipated that the patient will be medically stable for discharge from the hospital within 2 midnights of admission.   Author: Rolla Plate, DO 02/24/2022 1:35 AM  For on call review [email or web address].

## 2022-02-24 NOTE — Progress Notes (Signed)
*  PRELIMINARY RESULTS* Echocardiogram Limited 2-D Echocardiogram  has been performed.  Samuel Germany 02/24/2022, 1:17 PM

## 2022-02-24 NOTE — ED Provider Notes (Signed)
Ultrasound ED Peripheral IV (Provider)  Date/Time: 02/24/2022 1:33 PM  Performed by: Teressa Lower, MD Authorized by: Teressa Lower, MD   Procedure details:    Indications: multiple failed IV attempts     Skin Prep: chlorhexidine gluconate     Location:  Left AC   Angiocath:  20 G   Bedside Ultrasound Guided: Yes     Images: not archived     Patient tolerated procedure without complications: Yes     Dressing applied: Abner Greenspan, MD 02/24/22 [phone]

## 2022-02-24 NOTE — Assessment & Plan Note (Signed)
-   Smokes cigarettes - Declines nicotine patch due to allergic reaction to the adhesive

## 2022-02-24 NOTE — ED Notes (Signed)
Echo at bedside at this time

## 2022-02-24 NOTE — Assessment & Plan Note (Signed)
Continue PPI ?

## 2022-02-24 NOTE — Consult Note (Addendum)
Cardiology Consultation   Patient ID: Meghan Jimenez MRN: [id number]; DOB: 06/05/1960  Admit date: 02/23/2022 Date of Consult: 02/24/2022  PCP:  Inc, [place] [facility] Cardiologist:  Previously followed by Dr. Nehemiah Massed at [facility] in 2018; Follow-up with CT Surgery at [facility] in the interim  Patient Profile:   Meghan Jimenez is a 61 y.o. 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 Clearence Ped.  History of Present Illness:   [name] was admitted to [facility] in 09/2021 for evaluation of chest pain and troponin values were negative at that time. She did undergo a stress cardiac MRI which showed normal LV function with a preserved EF of 81% and no wall motion abnormalities.  Stress perfusion imaging showed no inducible ischemia and no evidence of an LV thrombus.  She was recently evaluated at [facility] ED in 12/2021 for chest pain after having been diagnosed with a "collapsed lung". Her pain was overall felt to be pleuritic and troponin values were negative. CTA showed no evidence of PE but there was concern for PNA and she was discharged home on antibiotic treatment.  She presented to [facility] ED yesterday afternoon for evaluation of chest pain and EMS reported her BP was significantly elevated at 200/102 on initial check and she informed them she had been without her medications for over 5 days. In talking with the patient today, she reports having significant chest pain ever since prior CABG in 2018 and says there is not a day that she does not have chest pain. Says that she routinely uses cocaine to help with her chest pain as she does not  experience improvement with Tylenol. She has not been evaluated by pain management per her report. She does report baseline dyspnea and feels like her airway wants to close at times and her chest sometimes hurts more when she tries to take a deep breath. No specific orthopnea, PND or pitting edema. When asked why she came in for pain yesterday, she says that she had been without her medications for several days including her anxiety medications and could not take the discomfort anymore.  Initial labs showed WBC 5.6, Hgb 11.6, platelets 103, Na+ 138, K+ 2.7 and creatinine 0.58.  Initial and repeat troponin values have been negative at 8. UDS is pending. FLP shows total cholesterol 135, triglycerides 87, HDL 27 and LDL 91. EKG shows sinus arrhythmia, HR 73 with baseline artifact but no acute ST abnormalities.  CXR showing no acute abnormalities.  CT a abdomen showed no evidence of aortic aneurysm or dissection. Was noted to have portal venous hypertension with splenic and gastric varices along with cirrhosis and a subxiphoid fat-containing ventral hernia.   Past Medical History:  Diagnosis Date   CAD (coronary artery disease)    CHF (congestive heart failure) (HCC)    Cirrhosis (HCC)    COPD (chronic obstructive pulmonary disease) (HCC)    Diabetes mellitus without complication ([place])    Hepatitis C    Hypertension     Past Surgical History:  Procedure Laterality Date   BACK SURGERY     CARDIAC SURGERY     ESOPHAGOGASTRODUODENOSCOPY (EGD) WITH  PROPOFOL N/A 12/11/2015   Procedure: ESOPHAGOGASTRODUODENOSCOPY (EGD) WITH PROPOFOL;  Surgeon: San Jetty, MD;  Location: [facility] ENDOSCOPY;  Service: Gastroenterology;  Laterality: N/A;   LEFT HEART CATH AND CORONARY ANGIOGRAPHY N/A 11/13/2016   Procedure: Left Heart Cath and Coronary Angiography;  Surgeon: Corey Skains, MD;  Location: [place] CV LAB;  Service: Cardiovascular;  Laterality: N/A;     Home Medications:  Prior to Admission  medications   Medication Sig Start Date End Date Taking? Authorizing Provider  aspirin EC 81 MG tablet Take 81 mg by mouth daily.   Yes [provider]  carvedilol (COREG) 3.125 MG tablet Take 3.125 mg by mouth 2 (two) times daily. 11/21/21  Yes [provider]  diphenhydrAMINE (BENADRYL) 50 MG capsule Take 50 mg by mouth every 6 (six) hours as needed.   Yes [provider]  hydrOXYzine (ATARAX) 25 MG tablet Take 25 mg by mouth 3 (three) times daily. 12/26/21  Yes [provider]  pantoprazole (PROTONIX) 40 MG tablet Take 40 mg by mouth 2 (two) times daily before a meal.   Yes [provider]  pregabalin (LYRICA) 300 MG capsule Take 300 mg by mouth 2 (two) times daily.   Yes [provider]  venlafaxine XR (EFFEXOR-XR) 150 MG 24 hr capsule Take 150 mg by mouth daily. 11/21/21  Yes [provider]  albuterol (VENTOLIN HFA) 108 (90 Base) MCG/ACT inhaler Inhale 1-2 puffs into the lungs every 4 (four) hours as needed for wheezing or shortness of breath. Patient not taking: Reported on 02/23/2022 01/10/22   [provider]    Inpatient Medications: Scheduled Meds:  aspirin EC  81 mg Oral Daily   heparin  5,000 Units Subcutaneous Q8H   pantoprazole  40 mg Oral BID AC   pregabalin  300 mg Oral BID   rosuvastatin  10 mg Oral Daily   venlafaxine XR  150 mg Oral Daily   Continuous Infusions:  0.9 % NaCl with KCl 40 mEq / L 75 mL/hr at 02/24/22 0115   PRN Meds: acetaminophen, hydrALAZINE, morphine injection, ondansetron (ZOFRAN) IV  Allergies:    Allergies  Allergen Reactions   Aspirin Anaphylaxis and Palpitations    Patient takes ibuprofen as home med, so no problem with NSAIDs   Tramadol Anaphylaxis   Acetaminophen     Other reaction(s): Unknown   Latex    Codeine Rash   Vicodin [Hydrocodone-Acetaminophen] Palpitations    Social History:   Social History   Socioeconomic History   Marital status: Widowed    Spouse  name: Not on file   Number of children: Not on file   Years of education: Not on file   Highest education level: Not on file  Occupational History   Not on file  Tobacco Use   Smoking status: Every Day    Packs/day: 1.00    Types: Cigarettes   Smokeless tobacco: Never   Tobacco comments:    SMOKES 3-4 PKS/DAY  Substance and Sexual Activity   Alcohol use: Yes    Comment: Drinks occasionally every week   Drug use: Yes    Types: "Crack" cocaine    Comment: last used 3 days ago   Sexual activity: Never    Birth control/protection: None  Other Topics Concern   Not on file  Social History Narrative   Not on file   Social Determinants of Health   Financial Resource Strain: Not on file  Food Insecurity: Not on file  Transportation Needs: Not  on file  Physical Activity: Not on file  Stress: Not on file  Social Connections: Not on file  Intimate Partner Violence: Not on file    Family History:    Family History  Problem Relation Age of Onset   CAD Mother    Diabetes Mother    Lung cancer Father      ROS:  Please see the history of present illness.   All other ROS reviewed and negative.     Physical Exam/Data:   Vitals:   02/24/22 0700 02/24/22 0717 02/24/22 0730 02/24/22 0800  BP: 129/76 (!) 141/82 (!) 114/98 (!) 148/82  Pulse:  85 87 88  Resp: 17 16 19 20   Temp:  98.5 F (36.9 C)    TempSrc:  Oral    SpO2:  91% 90% 92%  Weight:      Height:       No intake or output data in the 24 hours ending 02/24/22 0857    02/23/2022    2:32 PM 05/24/2021    6:30 PM 05/14/2021    8:30 AM  Last 3 Weights  Weight (lbs) 165 lb 160 lb 184 lb 15.5 oz  Weight (kg) 74.844 kg 72.576 kg 83.9 kg     Body mass index is 28.32 kg/m.  General: Female appearing in no acute distress. Tearful at times.  HEENT: normal Neck: no JVD Vascular: No carotid bruits; Distal pulses 2+ bilaterally Cardiac:  normal S1, S2; RRR; no murmur. Concave sternum with muscle flaps in place. Fatty  mass also noted along distal aspect of chest.  Lungs:  clear to auscultation bilaterally, no wheezing, rhonchi or rales  Abd: soft, nontender, no hepatomegaly  Ext: no edema Musculoskeletal:  No deformities, BUE and BLE strength normal and equal Skin: warm and dry  Neuro:  CNs 2-12 intact, no focal abnormalities noted Psych:  Normal affect   EKG:  The EKG was personally reviewed and demonstrates: sinus arrhythmia, HR 73 with baseline artifact but no acute ST abnormalities.  Telemetry:  Telemetry was personally reviewed and demonstrates: NSR, HR in 70's to 80's.   Relevant CV Studies:  LHC: 10/2016 Prox LAD lesion, 90 %stenosed.   Assessment The patient has had progressive canadian class 4 anginal symptoms with   risk factors including diabetes, high blood pressure, high cholesterol and smoking.   normal left ventricular function with ejection fraction of 60%   severe 1 vessel coronary artery disease    There is significant stenosis of left anterior descending   Plan Continue medical management of CAD risk factors, Consider PCI and stent placement in culprit artery and Additional medications for management of angina   Echocardiogram: 10/2016 NORMAL LEFT VENTRICULAR FUNCTION WITH MILD LVH    NORMAL LA PRESSURES WITH NORMAL DIASTOLIC FUNCTION    NORMAL RIGHT VENTRICULAR SYSTOLIC FUNCTION    VALVULAR REGURGITATION: TRIVIAL MR, MILD PR, TRIVIAL TR    NO VALVULAR STENOSIS    MODERATE PERICARDIAL EFFUSION (See above)    cMRI: 09/2021 1. The left ventricle is normal in cavity size. There is basal sigmoid septal hypertrophy with max. wall  thickness of 1.4 cm. Global systolic function is normal, the LV ejection fraction is 81%. There are no  regional wall motion abnormalities.   2. The right ventricle is normal in cavity size, wall thickness, and systolic function.   3. Both atria are normal in size.   4. The aortic valve is trileaflet with no significant aortic valve stenosis.  There is mild aortic  regurgitation.   5. There is mild tricuspid and trivial mitral regurgitation.   6. Delayed enhancement imaging demonstrates no evidence of myocardial infarction, scarring, or  infiltration.   7. Regadenoson stress perfusion imaging demonstrates no evidence of inducible myocardial ischemia.     8. There is no evidence of an LV thrombus.    Laboratory Data:  High Sensitivity Troponin:   Recent Labs  Lab 02/23/22 1439 02/23/22 1739 02/24/22 0310  TROPONINIHS 8 8 8      Chemistry Recent Labs  Lab 02/23/22 1439 02/24/22 0310  NA 138 140  K 2.7* 3.3*  CL 105 108  CO2 25 25  GLUCOSE 127* 89  BUN 6* <5*  CREATININE 0.58 0.43*  CALCIUM 9.1 8.8*  MG  --  1.9  GFRNONAA >60 >60  ANIONGAP 8 7    Recent Labs  Lab 02/24/22 0310  PROT 7.2  ALBUMIN 3.3*  AST 46*  ALT 30  ALKPHOS 95  BILITOT 1.0   Lipids  Recent Labs  Lab 02/24/22 0310  CHOL 135  TRIG 87  HDL 27*  LDLCALC 91  CHOLHDL 5.0    Hematology Recent Labs  Lab 02/23/22 1439 02/24/22 0310  WBC 5.6 4.7  RBC 5.39* 5.09  HGB 11.6* 10.9*  HCT 36.6 34.7*  MCV 67.9* 68.2*  MCH 21.5* 21.4*  MCHC 31.7 31.4  RDW 18.9* 18.0*  PLT 103* 95*   Thyroid No results for input(s): "TSH", "FREET4" in the last 168 hours.  BNPNo results for input(s): "BNP", "PROBNP" in the last 168 hours.  DDimer No results for input(s): "DDIMER" in the last 168 hours.   Radiology/Studies:  CT Angio Chest/Abd/Pel for Dissection W and/or Wo Contrast  Result Date: 02/23/2022 CLINICAL DATA:  Chest pain, hypertension EXAM: CT ANGIOGRAPHY CHEST, ABDOMEN AND PELVIS TECHNIQUE: Non-contrast CT of the chest was initially obtained. Multidetector CT imaging through the chest, abdomen and pelvis was performed using the standard protocol during bolus administration of intravenous contrast. Multiplanar reconstructed images and MIPs were obtained and reviewed to evaluate the vascular anatomy. RADIATION DOSE REDUCTION: This  exam was performed according to the departmental dose-optimization program which includes automated exposure control, adjustment of the mA and/or kV according to patient size and/or use of iterative reconstruction technique. CONTRAST:  129mL OMNIPAQUE IOHEXOL 350 MG/ML SOLN COMPARISON:  02/23/2022, 01/24/2022 FINDINGS: CTA CHEST FINDINGS Cardiovascular: No evidence of thoracic aortic aneurysm or dissection. 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 The great vessels are widely patent. The heart is unremarkable without pericardial effusion. Postsurgical changes from previous CABG. Atherosclerosis of the native coronary vessels. Mediastinum/Nodes: No enlarged mediastinal, hilar, or axillary lymph nodes. Thyroid gland, trachea, and esophagus demonstrate no significant findings. Lungs/Pleura: No acute airspace disease, effusion, or pneumothorax. Chronic areas of atelectasis involving the lingula and right middle lobe. Central airways are patent. Musculoskeletal: Postsurgical changes from prior median sternotomy, with chronic sternal defect. The subxiphoid hernia seen previously contains fat. Previously herniated transverse colon has been reduced, with no bowel herniation at this time. Reconstructed images demonstrate no additional findings. Review of the MIP images confirms the above findings. CTA ABDOMEN AND PELVIS FINDINGS VASCULAR Aorta: Normal caliber aorta without aneurysm, dissection, vasculitis or significant stenosis. Diffuse aortic atherosclerosis. Celiac: Patent without evidence of aneurysm, dissection, vasculitis or significant stenosis. SMA: Patent without evidence of aneurysm, dissection, vasculitis or significant stenosis. Renals: Both renal arteries are patent without evidence of aneurysm, dissection, vasculitis, fibromuscular dysplasia or significant stenosis. IMA: Patent without  evidence of aneurysm, dissection, vasculitis or significant stenosis. Inflow:  Patent without evidence of aneurysm, dissection, vasculitis or significant stenosis. Veins: Dilated splenic and main portal vein consistent with portal venous hypertension. Splenic and gastric varices are noted. Review of the MIP images confirms the above findings. NON-VASCULAR Hepatobiliary: Cirrhotic morphology of the liver. No focal parenchymal abnormality. Cholecystectomy. No biliary duct dilation. Pancreas: Unremarkable. No pancreatic ductal dilatation or surrounding inflammatory changes. Spleen: Splenomegaly, consistent with portal venous hypertension. No focal parenchymal abnormality. Adrenals/Urinary Tract: Adrenal glands are unremarkable. Kidneys are normal, without renal calculi, focal lesion, or hydronephrosis. Bladder is unremarkable. Excreted contrast is seen within the ureters and bladder. Stomach/Bowel: No bowel obstruction or ileus. No bowel wall thickening or inflammatory change. Lymphatic: No pathologic adenopathy within the abdomen or pelvis. Reproductive: Uterus and bilateral adnexa are unremarkable. Other: No free fluid or free intraperitoneal gas. Subxiphoid ventral hernia as described under the CT chest portion of the exam. No other abdominal wall hernia. Musculoskeletal: There are no acute or destructive bony lesions. Postsurgical changes lower lumbar spine. Reconstructed images demonstrate no additional findings. Review of the MIP images confirms the above findings. IMPRESSION: Vascular: 1. No evidence of thoracoabdominal aortic aneurysm or dissection. 2. Portal venous hypertension, with splenic and gastric varices as above. 3.  Aortic Atherosclerosis (ICD10-I70.0). Nonvascular: 1. Cirrhosis, with portal venous hypertension manifested by varices and splenomegaly as above. 2. Subxiphoid fat containing ventral hernia. The herniated bowel seen previously has been reduced in the interim. Electronically Signed   By: Randa Ngo M.D.   On: 02/23/2022 17:36   DG Chest 2 View  Result Date:  02/23/2022 CLINICAL DATA:  Chest pain. EXAM: CHEST - 2 VIEW COMPARISON:  Chest two views 01/23/2022, chest two views 05/24/2021; CT chest 01/24/2022 FINDINGS: Cardiac silhouette is again mildly to moderately enlarged. Surgical clips again overlie the left mediastinum. Mediastinal contours are within normal limits. Left midlung horizontal linear likely subsegmental atelectasis versus scarring. Mildly increased lucencies again compatible with chronic emphysematous changes within the predominantly upper lungs. No pleural effusion or pneumothorax. Mild multilevel degenerative disc changes of the thoracic spine. IMPRESSION: 1. Resolution of the prior small right pleural effusion. 2. On prior 01/24/2022 CT and 01/23/2022 radiograph, a midline substernal ventral upper abdominal hernia was seen with mesenteric fat and nonobstructed colon extending ventrally. This was also seen on frontal and lateral radiographs 01/23/2022. Herniated bowel in this region is not visualized on the current study. Electronically Signed   By: Yvonne Kendall M.D.   On: 02/23/2022 15:09     Assessment and Plan:   1. Chest Pain with Atypical Features - She reports chronic chest pain since undergoing CABG in 2018 and subsequent surgical repairs afterwards. Reports her pain is sometimes worse with inspiration but can occur at rest or with activity.  - Hs Troponin values have been negative at 8 and EKG shows no acute ST changes. At this time, do not see a strong indication for her to transfer to [facility] given that she has ruled out for ACS and in the setting of her atypical symptoms. Recent stress cardiac MRI in 09/2021 showed no ischemia and would not repeat stress testing at this time. Will obtain a limited echo for reassessment of her EF and wall motion. If this is reassuring, would recommend Pain Management referral as an outpatient given her chronic symptoms.   2. CAD - She is s/p CABG x1 with LIMA-LAD in 10/2016 at [facility] and  complicated by sternal MSSA infections requiring pectoralis  and omental flaps. She did have a recent stress cardiac MRI in 09/2021 showing no ischemia.  - She has ruled out for ACS and current symptoms seem atypical for angina.  - Listed as having anaphylaxis with ASA in the past but has been on this at home and tolerated well. Also remains on Coreg 3.125mg  BID (would use over Lopressor given her cocaine use). Would recommend restarting statin therapy.   3. Accelerated HTN - Her BP was significantly elevated at 191/82 upon arrival, improved to 148/82 on most recent check. She was restarted on Coreg 3.125mg  BID. Could add Spironolactone if BP remains above goal as this would help with her hypokalemia and cirrhosis as well.   4. HLD - FLP shows total cholesterol 135, triglycerides 87, HDL 27 and LDL 91. AST 46 and ALT 30 this admission. Would recommend starting Crestor 10mg  daily with follow-up FLP and LFT's in 6-8 weeks. Follow LFT's closely given known cirrhosis.   5. Substance Abuse - She reports frequently using Cocaine to help with her pain. We reviewed the risks associated with this. She would benefit from referral to Pain Management as an outpatient.   6. Hepatitis C/Cirrhosis - Previously followed by GI at [facility] with no recent follow-up. Needs to re-establish care as an outpatient.    7. Hypokalemia - K+ at 2.7 on admission and at 3.3 today with additional supplementation already ordered.    For questions or updates, please contact [place] Please consult [email or web address] for contact info under    Signed, Erma Heritage, PA-C  02/24/2022 8:57 AM  Attending note Patient seen and discussed with PA Ahmed Prima, I agree with her documentation. 61 yo female history of CAD with prior CABG with LIMA-LAD in 10/2016 with most recently a normal stress cardiac MRI at [facility] 09/2021 presents with chest pain in setting of HTN SBP 190s and +UDS for cocaine.    K 2.7 BUN 6 Cr 0.58 WBC 5.6  Hgb 11.6 Plt 103 LDL 91 TG 87 UDS +cocaine, opiates Trop neg 8-->8-->8 EKG SR, no acute ischemic changes CXR no acute process CTA chest; No acute aortic pathology  1.CAD/Chest pain - history of CAD with prior CABG with LIMA-LAD in 10/2016 - normal stress cardiac MRI at [facility] 09/2021 - presented SBP 190s, cocaine + - no objective evidence of ischemia by EKG or enzymes. Pain is atypical, on my history worst with deep breathing and chest wall tender to palpation - f/u limited echo, if no acute findings no further cardiac testing is planned  2. HTN - SBP 190s on admission, had not been taking home meds - currentl on norvasc 5. Use of beta blocker with cocaine is debated, there is no consensus and the hemodynamic effects are primarily theoretical. I generally use nonselective beta blockers such as coreg or labetlol, would restart her home coreg 3.125mg  bid.   F/u echo, if benign no further cardiac workup and we will sign off inpatient care. Continue regular f/u with Saundra Shelling MD

## 2022-02-24 NOTE — Assessment & Plan Note (Signed)
-   With history of coronary artery disease - This pain is worse than when she had a heart attack before - Likely related to cocaine use especially while being on a beta-blocker - Electrolyte derangements could also be contributing - CTA of the chest abdomen and pelvis shows no evidence of aneurysm or dissection, no PE noted - Cardiology was consulted from [facility] and recommends transfer to [facility] for cardiac ops - Troponins normal 8, 8 - Cycle troponin in the a.m. - Monitor on telemetry - EKG shows heart rate of 80, sinus rhythm, QTc 4 and 75 - Patient was treated with morphine and Nitrostat in the ED - Continue morphine as needed-if allergy is actually an intolerance - Continue to monitor

## 2022-02-24 NOTE — ED Notes (Signed)
Lunch tray given to pt.

## 2022-02-24 NOTE — ED Notes (Signed)
Breakfast tray given. °

## 2022-02-24 NOTE — Discharge Summary (Addendum)
Physician Discharge Summary  Meghan Jimenez BLT:[id number] DOB: 10-30-1960 DOA: 02/23/2022  PCP: [facility]  Admit date: 02/23/2022 Discharge date: 02/24/2022  Admitted From: Home Disposition: Home  Recommendations for Outpatient Follow-up:  Follow up with PCP in 1-2 weeks Please obtain BMP/CBC in one week Continue regular follow-up with [facility] Follow-up with PCP for management of chronic musculoskeletal chest pain   Discharge Condition: Stable CODE STATUS: Full code Diet recommendation: Heart healthy  Brief/Interim Summary: 61 year old female with a history of coronary artery disease status post CABG, COPD, diabetes, hepatitis C, hypertension, cirrhosis, 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  She reports that she been dealing with this pain for quite some time.  She has been trying to manage this at home.  She reports taking cocaine to help manage her pain.  On arrival to the emergency room she is noted to be hypertensive.  She was also noted to be hypokalemic.  She has had dizziness, palpitations, nausea and vomiting.  She did not have any syncopal episodes.  Patient was started on IV fluid hydration and electrolyte replacement.  Cardiac enzymes found to be negative.  EKG did not show any acute findings.  Initially, case was discussed with cardiology over the phone and it was felt that she may need transfer to [facility] for further cardiac evaluation.  Fortunately, she was able to be evaluated by the cardiology team at [facility] the following morning who felt that patient did not require transfer to [facility].  Echocardiogram was also performed that did not show any acute changes.  No further cardiac work-up was recommended and it was felt that her pain was chronic and related to her significant sternal abnormalities, musculoskeletal pain.  She has been advised to follow-up with her  PCP to discuss further pain management versus referral to pain management clinic.  She was noted to be mildly hypoxic on room air while sleeping.  Her oxygen saturations did go down to 87%.  When she woke up, oxygen saturations improved to 90 to 93%.  She was placed on supplemental oxygen.  Arrangements are being made for her to have nocturnal oxygen at home with nocturnal pulse ox study performed.  Also, may need sleep study performed, but would defer this to primary care physician.  She was counseled extensively on the effects of ongoing cocaine use and she affirms that she does not plan on using any further cocaine.  She is continued on Coreg and amlodipine was also added to her antihypertensive regimen.  Blood pressure currently stable.  Discharge Diagnoses:  Principal Problem:   Chest pain Active Problems:   Hypokalemia   Substance abuse (HCC)   Tobacco use disorder   Essential hypertension   GERD (gastroesophageal reflux disease)   Emphysema lung (HCC)    Discharge Instructions  Discharge Instructions     Diet - low sodium heart healthy   Complete by: As directed    For home use only DME oxygen   Complete by: As directed    Overnight home oximetry   Length of Need: 6 Months   Mode or (Route): Nasal cannula   Liters per Minute: 2   Frequency: Only at night (stationary unit needed)   Oxygen conserving device: Yes   Oxygen delivery system: Gas   Increase activity slowly   Complete by: As directed       Allergies as of 02/24/2022  Reactions   Aspirin Anaphylaxis, Palpitations   Patient takes ibuprofen as home med, so no problem with NSAIDs   Tramadol Anaphylaxis   Acetaminophen    Other reaction(s): Unknown   Latex    Codeine Rash   Vicodin [hydrocodone-acetaminophen] Palpitations        Medication List     TAKE these medications    albuterol 108 (90 Base) MCG/ACT inhaler Commonly known as: VENTOLIN HFA Inhale 1-2 puffs into the lungs every 4 (four)  hours as needed for wheezing or shortness of breath.   amLODipine 5 MG tablet Commonly known as: NORVASC Take 1 tablet (5 mg total) by mouth daily. Start taking on: February 25, 2022   aspirin EC 81 MG tablet Take 81 mg by mouth daily.   carvedilol 3.125 MG tablet Commonly known as: COREG Take 3.125 mg by mouth 2 (two) times daily.   diphenhydrAMINE 50 MG capsule Commonly known as: BENADRYL Take 50 mg by mouth every 6 (six) hours as needed.   hydrOXYzine 25 MG tablet Commonly known as: ATARAX Take 25 mg by mouth 3 (three) times daily.   pantoprazole 40 MG tablet Commonly known as: PROTONIX Take 40 mg by mouth 2 (two) times daily before a meal.   pregabalin 300 MG capsule Commonly known as: LYRICA Take 300 mg by mouth 2 (two) times daily.   venlafaxine XR 150 MG 24 hr capsule Commonly known as: EFFEXOR-XR Take 150 mg by mouth daily.               Durable Medical Equipment  (From admission, onward)           Start     Ordered   02/24/22 0000  For home use only DME oxygen       Comments: Overnight home oximetry  Question Answer Comment  Length of Need 6 Months   Mode or (Route) Nasal cannula   Liters per Minute 2   Frequency Only at night (stationary unit needed)   Oxygen conserving device Yes   Oxygen delivery system Gas      02/24/22 1350            Follow-up Information     [facility].   Why: Call for Substance abuse counseling. Contact information: [phone]               Allergies  Allergen Reactions   Aspirin Anaphylaxis and Palpitations    Patient takes ibuprofen as home med, so no problem with NSAIDs   Tramadol Anaphylaxis   Acetaminophen     Other reaction(s): Unknown   Latex    Codeine Rash   Vicodin [Hydrocodone-Acetaminophen] Palpitations    Consultations: Cardiology   Procedures/Studies: ECHOCARDIOGRAM LIMITED  Result Date: 02/24/2022    ECHOCARDIOGRAM LIMITED REPORT   Patient Name:   Lonny PrudeSHERRY N  Rennels Date of Exam: 02/24/2022 Medical Rec #:  [id number]        Height:       64.0 in Accession #:    [id number]       Weight:       165.0 lb Date of Birth:  July 01, 1960        BSA:          1.803 m Patient Age:    61 years         BP:           148/82 mmHg Patient Gender: F  HR:           88 bpm. [facility] Procedure: 2D Echo, Cardiac Doppler and Color Doppler Indications:    Chest Pain R07.9  History:        Patient has prior history of Echocardiogram examinations, most                 recent 11/12/2015. CHF, CAD, COPD; Risk Factors:Hypertension,                 Diabetes and Current Smoker. Cirrhosis, hepatitis C, Substance                 abuse (HCC).  Sonographer:    Celesta Gentile RCS Referring Phys: [id number] Ellsworth Lennox IMPRESSIONS  1. Left ventricular ejection fraction, by estimation, is 70 to 75%. The left ventricle has hyperdynamic function. The left ventricle has no regional wall motion abnormalities. There is moderate left ventricular hypertrophy.  2. Right ventricular systolic function is normal. The right ventricular size is normal.  3. Limited echo to evaluate LV function FINDINGS  Left Ventricle: Left ventricular ejection fraction, by estimation, is 70 to 75%. The left ventricle has hyperdynamic function. The left ventricle has no regional wall motion abnormalities. The left ventricular internal cavity size was normal in size. There is moderate left ventricular hypertrophy. Right Ventricle: The right ventricular size is normal. Right vetricular wall thickness was not well visualized. Right ventricular systolic function is normal. Pericardium: There is no evidence of pericardial effusion. Aorta: The aortic root is normal in size and structure. LEFT VENTRICLE PLAX 2D LVIDd:         3.80 cm LVIDs:         1.60 cm LV PW:         1.40 cm LV IVS:        1.30 cm LVOT diam:     2.00 cm LVOT Area:     3.14 cm  RIGHT VENTRICLE TAPSE (M-mode): 2.2 cm LEFT ATRIUM         Index  LA diam:    4.10 cm 2.27 cm/m   AORTA Ao Root diam: 3.30 cm  SHUNTS Systemic Diam: 2.00 cm Dina Rich MD Electronically signed by Dina Rich MD Signature Date/Time: 02/24/2022/1:22:18 PM    Final    CT Angio Chest/Abd/Pel for Dissection W and/or Wo Contrast  Result Date: 02/23/2022 CLINICAL DATA:  Chest pain, hypertension EXAM: CT ANGIOGRAPHY CHEST, ABDOMEN AND PELVIS TECHNIQUE: Non-contrast CT of the chest was initially obtained. Multidetector CT imaging through the chest, abdomen and pelvis was performed using the standard protocol during bolus administration of intravenous contrast. Multiplanar reconstructed images and MIPs were obtained and reviewed to evaluate the vascular anatomy. RADIATION DOSE REDUCTION: This exam was performed according to the departmental dose-optimization program which includes automated exposure control, adjustment of the mA and/or kV according to patient size and/or use of iterative reconstruction technique. CONTRAST:  OMNIPAQUE IOHEXOL 350 MG/ML SOLN COMPARISON:  02/23/2022, 01/24/2022 FINDINGS: CTA CHEST FINDINGS Cardiovascular: No evidence of thoracic aortic aneurysm or dissection. 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 The great vessels are widely patent. The heart is unremarkable without pericardial effusion. Postsurgical changes from previous CABG. Atherosclerosis of the native coronary vessels. Mediastinum/Nodes: No enlarged mediastinal, hilar, or axillary lymph nodes. Thyroid gland, trachea, and esophagus demonstrate no significant findings. Lungs/Pleura: No acute airspace disease, effusion, or pneumothorax. Chronic areas of atelectasis involving the lingula and right middle  lobe. Central airways are patent. Musculoskeletal: Postsurgical changes from prior median sternotomy, with chronic sternal defect. The subxiphoid hernia seen previously contains fat. Previously herniated transverse colon has been  reduced, with no bowel herniation at this time. Reconstructed images demonstrate no additional findings. Review of the MIP images confirms the above findings. CTA ABDOMEN AND PELVIS FINDINGS VASCULAR Aorta: Normal caliber aorta without aneurysm, dissection, vasculitis or significant stenosis. Diffuse aortic atherosclerosis. Celiac: Patent without evidence of aneurysm, dissection, vasculitis or significant stenosis. SMA: Patent without evidence of aneurysm, dissection, vasculitis or significant stenosis. Renals: Both renal arteries are patent without evidence of aneurysm, dissection, vasculitis, fibromuscular dysplasia or significant stenosis. IMA: Patent without evidence of aneurysm, dissection, vasculitis or significant stenosis. Inflow: Patent without evidence of aneurysm, dissection, vasculitis or significant stenosis. Veins: Dilated splenic and main portal vein consistent with portal venous hypertension. Splenic and gastric varices are noted. Review of the MIP images confirms the above findings. NON-VASCULAR Hepatobiliary: Cirrhotic morphology of the liver. No focal parenchymal abnormality. Cholecystectomy. No biliary duct dilation. Pancreas: Unremarkable. No pancreatic ductal dilatation or surrounding inflammatory changes. Spleen: Splenomegaly, consistent with portal venous hypertension. No focal parenchymal abnormality. Adrenals/Urinary Tract: Adrenal glands are unremarkable. Kidneys are normal, without renal calculi, focal lesion, or hydronephrosis. Bladder is unremarkable. Excreted contrast is seen within the ureters and bladder. Stomach/Bowel: No bowel obstruction or ileus. No bowel wall thickening or inflammatory change. Lymphatic: No pathologic adenopathy within the abdomen or pelvis. Reproductive: Uterus and bilateral adnexa are unremarkable. Other: No free fluid or free intraperitoneal gas. Subxiphoid ventral hernia as described under the CT chest portion of the exam. No other abdominal wall hernia.  Musculoskeletal: There are no acute or destructive bony lesions. Postsurgical changes lower lumbar spine. Reconstructed images demonstrate no additional findings. Review of the MIP images confirms the above findings. IMPRESSION: Vascular: 1. No evidence of thoracoabdominal aortic aneurysm or dissection. 2. Portal venous hypertension, with splenic and gastric varices as above. 3.  Aortic Atherosclerosis (ICD10-I70.0). Nonvascular: 1. Cirrhosis, with portal venous hypertension manifested by varices and splenomegaly as above. 2. Subxiphoid fat containing ventral hernia. The herniated bowel seen previously has been reduced in the interim. Electronically Signed   By: Randa Ngo M.D.   On: 02/23/2022 17:36   DG Chest 2 View  Result Date: 02/23/2022 CLINICAL DATA:  Chest pain. EXAM: CHEST - 2 VIEW COMPARISON:  Chest two views 01/23/2022, chest two views 05/24/2021; CT chest 01/24/2022 FINDINGS: Cardiac silhouette is again mildly to moderately enlarged. Surgical clips again overlie the left mediastinum. Mediastinal contours are within normal limits. Left midlung horizontal linear likely subsegmental atelectasis versus scarring. Mildly increased lucencies again compatible with chronic emphysematous changes within the predominantly upper lungs. No pleural effusion or pneumothorax. Mild multilevel degenerative disc changes of the thoracic spine. IMPRESSION: 1. Resolution of the prior small right pleural effusion. 2. On prior 01/24/2022 CT and 01/23/2022 radiograph, a midline substernal ventral upper abdominal hernia was seen with mesenteric fat and nonobstructed colon extending ventrally. This was also seen on frontal and lateral radiographs 01/23/2022. Herniated bowel in this region is not visualized on the current study. Electronically Signed   By: Yvonne Kendall M.D.   On: 02/23/2022 15:09      Subjective: Reports intermittent chest pain.  Does not have any shortness of breath.  Reports that pain is  chronic.  Discharge Exam: Vitals:   02/24/22 1330 02/24/22 1400 02/24/22 1428 02/24/22 1430  BP: 133/68 131/73  (!) 143/74  Pulse:  98    Resp:  20 17  16   Temp:   98.6 F (37 C)   TempSrc:   Oral   SpO2:  92%    Weight:      Height:        General: Pt is alert, awake, not in acute distress Cardiovascular: RRR, S1/S2 +, no rubs, no gallops Respiratory: CTA bilaterally, no wheezing, no rhonchi Abdominal: Soft, NT, ND, bowel sounds + Extremities: no edema, no cyanosis    The results of significant diagnostics from this hospitalization (including imaging, microbiology, ancillary and laboratory) are listed below for reference.     Microbiology: No results found for this or any previous visit (from the past 240 hour(s)).   Labs: BNP (last 3 results) Recent Labs    05/24/21 2052 01/24/22 0222  BNP 72.3 67.7   Basic Metabolic Panel: Recent Labs  Lab 02/23/22 1439 02/24/22 0310  NA 138 140  K 2.7* 3.3*  CL 105 108  CO2 25 25  GLUCOSE 127* 89  BUN 6* <5*  CREATININE 0.58 0.43*  CALCIUM 9.1 8.8*  MG  --  1.9   Liver Function Tests: Recent Labs  Lab 02/24/22 0310  AST 46*  ALT 30  ALKPHOS 95  BILITOT 1.0  PROT 7.2  ALBUMIN 3.3*   No results for input(s): "LIPASE", "AMYLASE" in the last 168 hours. No results for input(s): "AMMONIA" in the last 168 hours. CBC: Recent Labs  Lab 02/23/22 1439 02/24/22 0310  WBC 5.6 4.7  HGB 11.6* 10.9*  HCT 36.6 34.7*  MCV 67.9* 68.2*  PLT 103* 95*   Cardiac Enzymes: No results for input(s): "CKTOTAL", "CKMB", "CKMBINDEX", "TROPONINI" in the last 168 hours. BNP: Invalid input(s): "POCBNP" CBG: No results for input(s): "GLUCAP" in the last 168 hours. D-Dimer No results for input(s): "DDIMER" in the last 72 hours. Hgb A1c No results for input(s): "HGBA1C" in the last 72 hours. Lipid Profile Recent Labs    02/24/22 0310  CHOL 135  HDL 27*  LDLCALC 91  TRIG 87  CHOLHDL 5.0   Thyroid function studies No  results for input(s): "TSH", "T4TOTAL", "T3FREE", "THYROIDAB" in the last 72 hours.  Invalid input(s): "FREET3" Anemia work up No results for input(s): "VITAMINB12", "FOLATE", "FERRITIN", "TIBC", "IRON", "RETICCTPCT" in the last 72 hours. Urinalysis    Component Value Date/Time   COLORURINE YELLOW (A) 04/28/2019 1713   APPEARANCEUR CLEAR (A) 04/28/2019 1713   LABSPEC 1.011 04/28/2019 1713   PHURINE 5.0 04/28/2019 1713   GLUCOSEU NEGATIVE 04/28/2019 1713   HGBUR NEGATIVE 04/28/2019 1713   BILIRUBINUR NEGATIVE 04/28/2019 1713   KETONESUR NEGATIVE 04/28/2019 1713   PROTEINUR NEGATIVE 04/28/2019 1713   NITRITE NEGATIVE 04/28/2019 1713   LEUKOCYTESUR NEGATIVE 04/28/2019 1713   Sepsis Labs Recent Labs  Lab 02/23/22 1439 02/24/22 0310  WBC 5.6 4.7   Microbiology No results found for this or any previous visit (from the past 240 hour(s)).   Time coordinating discharge: 02/26/22  SIGNED:   , MD  [facility] 02/24/2022, 3:11 PM   If 7PM-7AM, please contact night-coverage [email or web address]

## 2022-07-19 DIAGNOSIS — Z0131 Encounter for examination of blood pressure with abnormal findings: Secondary | ICD-10-CM | POA: Diagnosis not present

## 2022-07-19 DIAGNOSIS — B182 Chronic viral hepatitis C: Secondary | ICD-10-CM | POA: Diagnosis not present

## 2022-07-19 DIAGNOSIS — Z1331 Encounter for screening for depression: Secondary | ICD-10-CM | POA: Diagnosis not present

## 2022-07-19 DIAGNOSIS — K7460 Unspecified cirrhosis of liver: Secondary | ICD-10-CM | POA: Diagnosis not present

## 2022-07-19 DIAGNOSIS — I10 Essential (primary) hypertension: Secondary | ICD-10-CM | POA: Diagnosis not present

## 2022-07-19 DIAGNOSIS — D509 Iron deficiency anemia, unspecified: Secondary | ICD-10-CM | POA: Diagnosis not present

## 2022-07-19 DIAGNOSIS — Z1389 Encounter for screening for other disorder: Secondary | ICD-10-CM | POA: Diagnosis not present

## 2022-08-13 ENCOUNTER — Emergency Department: Payer: Medicare HMO

## 2022-08-13 ENCOUNTER — Encounter: Payer: Self-pay | Admitting: Emergency Medicine

## 2022-08-13 ENCOUNTER — Other Ambulatory Visit: Payer: Self-pay

## 2022-08-13 ENCOUNTER — Inpatient Hospital Stay
Admission: EM | Admit: 2022-08-13 | Discharge: 2022-08-15 | DRG: 190 | Disposition: A | Discharge status: Home / Self Care | Payer: Medicare HMO | Attending: Internal Medicine | Admitting: Internal Medicine

## 2022-08-13 ENCOUNTER — Observation Stay: Payer: Medicare HMO

## 2022-08-13 DIAGNOSIS — E119 Type 2 diabetes mellitus without complications: Secondary | ICD-10-CM | POA: Diagnosis not present

## 2022-08-13 DIAGNOSIS — J9621 Acute and chronic respiratory failure with hypoxia: Principal | ICD-10-CM | POA: Diagnosis present

## 2022-08-13 DIAGNOSIS — J189 Pneumonia, unspecified organism: Secondary | ICD-10-CM

## 2022-08-13 DIAGNOSIS — K7030 Alcoholic cirrhosis of liver without ascites: Secondary | ICD-10-CM

## 2022-08-13 DIAGNOSIS — Z833 Family history of diabetes mellitus: Secondary | ICD-10-CM

## 2022-08-13 DIAGNOSIS — I110 Hypertensive heart disease with heart failure: Secondary | ICD-10-CM | POA: Diagnosis present

## 2022-08-13 DIAGNOSIS — I2510 Atherosclerotic heart disease of native coronary artery without angina pectoris: Secondary | ICD-10-CM | POA: Diagnosis present

## 2022-08-13 DIAGNOSIS — Z79899 Other long term (current) drug therapy: Secondary | ICD-10-CM

## 2022-08-13 DIAGNOSIS — Z87892 Personal history of anaphylaxis: Secondary | ICD-10-CM

## 2022-08-13 DIAGNOSIS — Z886 Allergy status to analgesic agent status: Secondary | ICD-10-CM

## 2022-08-13 DIAGNOSIS — F1910 Other psychoactive substance abuse, uncomplicated: Secondary | ICD-10-CM | POA: Diagnosis present

## 2022-08-13 DIAGNOSIS — M5030 Other cervical disc degeneration, unspecified cervical region: Secondary | ICD-10-CM | POA: Insufficient documentation

## 2022-08-13 DIAGNOSIS — J9811 Atelectasis: Secondary | ICD-10-CM | POA: Diagnosis present

## 2022-08-13 DIAGNOSIS — Z8709 Personal history of other diseases of the respiratory system: Secondary | ICD-10-CM

## 2022-08-13 DIAGNOSIS — Z885 Allergy status to narcotic agent status: Secondary | ICD-10-CM

## 2022-08-13 DIAGNOSIS — J441 Chronic obstructive pulmonary disease with (acute) exacerbation: Principal | ICD-10-CM | POA: Diagnosis present

## 2022-08-13 DIAGNOSIS — I5032 Chronic diastolic (congestive) heart failure: Secondary | ICD-10-CM | POA: Diagnosis not present

## 2022-08-13 DIAGNOSIS — F1410 Cocaine abuse, uncomplicated: Secondary | ICD-10-CM | POA: Diagnosis present

## 2022-08-13 DIAGNOSIS — Z1152 Encounter for screening for COVID-19: Secondary | ICD-10-CM

## 2022-08-13 DIAGNOSIS — Z66 Do not resuscitate: Secondary | ICD-10-CM | POA: Diagnosis present

## 2022-08-13 DIAGNOSIS — I10 Essential (primary) hypertension: Secondary | ICD-10-CM | POA: Diagnosis present

## 2022-08-13 DIAGNOSIS — Z91040 Latex allergy status: Secondary | ICD-10-CM

## 2022-08-13 DIAGNOSIS — Z801 Family history of malignant neoplasm of trachea, bronchus and lung: Secondary | ICD-10-CM

## 2022-08-13 DIAGNOSIS — B1920 Unspecified viral hepatitis C without hepatic coma: Secondary | ICD-10-CM | POA: Insufficient documentation

## 2022-08-13 DIAGNOSIS — J440 Chronic obstructive pulmonary disease with acute lower respiratory infection: Secondary | ICD-10-CM | POA: Diagnosis present

## 2022-08-13 DIAGNOSIS — Z7982 Long term (current) use of aspirin: Secondary | ICD-10-CM

## 2022-08-13 DIAGNOSIS — K7460 Unspecified cirrhosis of liver: Secondary | ICD-10-CM | POA: Diagnosis present

## 2022-08-13 DIAGNOSIS — G8929 Other chronic pain: Secondary | ICD-10-CM | POA: Diagnosis present

## 2022-08-13 DIAGNOSIS — Z951 Presence of aortocoronary bypass graft: Secondary | ICD-10-CM

## 2022-08-13 DIAGNOSIS — Z8249 Family history of ischemic heart disease and other diseases of the circulatory system: Secondary | ICD-10-CM

## 2022-08-13 DIAGNOSIS — F17210 Nicotine dependence, cigarettes, uncomplicated: Secondary | ICD-10-CM | POA: Diagnosis present

## 2022-08-13 LAB — CBC
HCT: 40.8 % (ref 36.0–46.0)
Hemoglobin: 12.6 g/dL (ref 12.0–15.0)
MCH: 20.6 pg — ABNORMAL LOW (ref 26.0–34.0)
MCHC: 30.9 g/dL (ref 30.0–36.0)
MCV: 66.6 fL — ABNORMAL LOW (ref 80.0–100.0)
Platelets: 135 10*3/uL — ABNORMAL LOW (ref 150–400)
RBC: 6.13 MIL/uL — ABNORMAL HIGH (ref 3.87–5.11)
RDW: 20.7 % — ABNORMAL HIGH (ref 11.5–15.5)
WBC: 7.2 10*3/uL (ref 4.0–10.5)
nRBC: 0 % (ref 0.0–0.2)

## 2022-08-13 LAB — BASIC METABOLIC PANEL
Anion gap: 12 (ref 5–15)
BUN: 10 mg/dL (ref 8–23)
CO2: 21 mmol/L — ABNORMAL LOW (ref 22–32)
Calcium: 9.3 mg/dL (ref 8.9–10.3)
Chloride: 104 mmol/L (ref 98–111)
Creatinine, Ser: 0.59 mg/dL (ref 0.44–1.00)
GFR, Estimated: >60 mL/min (ref >60)
Glucose, Bld: 91 mg/dL (ref 70–99)
Potassium: 3.8 mmol/L (ref 3.5–5.1)
Sodium: 137 mmol/L (ref 135–145)

## 2022-08-13 LAB — RESPIRATORY PANEL BY PCR

## 2022-08-13 LAB — PROCALCITONIN: Procalcitonin: <0.1 ng/mL

## 2022-08-13 LAB — TROPONIN I (HIGH SENSITIVITY)
Troponin I (High Sensitivity): 5 ng/L (ref <18)
Troponin I (High Sensitivity): 3 ng/L (ref <18)

## 2022-08-13 LAB — GLUCOSE, CAPILLARY: Glucose-Capillary: 289 mg/dL — ABNORMAL HIGH (ref 70–99)

## 2022-08-13 LAB — CBG MONITORING, ED: Glucose-Capillary: 130 mg/dL — ABNORMAL HIGH (ref 70–99)

## 2022-08-13 LAB — HEPATIC FUNCTION PANEL
ALT: 22 U/L (ref 0–44)
AST: 45 U/L — ABNORMAL HIGH (ref 15–41)
Albumin: 3.3 g/dL — ABNORMAL LOW (ref 3.5–5.0)
Alkaline Phosphatase: 112 U/L (ref 38–126)
Bilirubin, Direct: 0.4 mg/dL — ABNORMAL HIGH (ref 0.0–0.2)
Indirect Bilirubin: 0.8 mg/dL (ref 0.3–0.9)
Total Bilirubin: 1.2 mg/dL (ref 0.3–1.2)
Total Protein: 7.9 g/dL (ref 6.5–8.1)

## 2022-08-13 LAB — LACTIC ACID, PLASMA: Lactic Acid, Venous: 1.4 mmol/L (ref 0.5–1.9)

## 2022-08-13 LAB — SARS CORONAVIRUS 2 BY RT PCR: SARS Coronavirus 2 by RT PCR: NEGATIVE

## 2022-08-13 LAB — HEMOGLOBIN A1C
Hgb A1c MFr Bld: 5.3 % (ref 4.8–5.6)
Mean Plasma Glucose: 105.41 mg/dL

## 2022-08-13 LAB — PROTIME-INR
INR: 1.1 (ref 0.8–1.2)
Prothrombin Time: 14.5 seconds (ref 11.4–15.2)

## 2022-08-13 MED ORDER — IPRATROPIUM-ALBUTEROL 0.5-2.5 (3) MG/3ML IN SOLN
3.0000 mL | Freq: Once | RESPIRATORY_TRACT | Status: AC
  Administered 2022-08-13: 3 mL via RESPIRATORY_TRACT
  Filled 2022-08-13: qty 3

## 2022-08-13 MED ORDER — INSULIN ASPART 100 UNIT/ML IJ SOLN
0.0000 [IU] | Freq: Three times a day (TID) | INTRAMUSCULAR | Status: DC
  Administered 2022-08-13 – 2022-08-14 (×4): 1 [IU] via SUBCUTANEOUS
  Filled 2022-08-13 (×4): qty 1

## 2022-08-13 MED ORDER — FERROUS SULFATE 325 (65 FE) MG PO TABS
325.0000 mg | ORAL_TABLET | ORAL | Status: DC
  Filled 2022-08-13: qty 1

## 2022-08-13 MED ORDER — CARVEDILOL 6.25 MG PO TABS
3.1250 mg | ORAL_TABLET | Freq: Two times a day (BID) | ORAL | Status: DC
  Administered 2022-08-13 – 2022-08-15 (×4): 3.125 mg via ORAL
  Filled 2022-08-13 (×4): qty 1

## 2022-08-13 MED ORDER — ENOXAPARIN SODIUM 40 MG/0.4ML IJ SOSY
40.0000 mg | PREFILLED_SYRINGE | INTRAMUSCULAR | Status: DC
  Administered 2022-08-13 – 2022-08-14 (×2): 40 mg via SUBCUTANEOUS
  Filled 2022-08-13 (×2): qty 0.4

## 2022-08-13 MED ORDER — IPRATROPIUM-ALBUTEROL 0.5-2.5 (3) MG/3ML IN SOLN
3.0000 mL | Freq: Four times a day (QID) | RESPIRATORY_TRACT | Status: DC
  Administered 2022-08-13 – 2022-08-14 (×3): 3 mL via RESPIRATORY_TRACT
  Filled 2022-08-13 (×4): qty 3

## 2022-08-13 MED ORDER — PANTOPRAZOLE SODIUM 40 MG PO TBEC
40.0000 mg | DELAYED_RELEASE_TABLET | Freq: Two times a day (BID) | ORAL | Status: DC
  Administered 2022-08-13 – 2022-08-15 (×4): 40 mg via ORAL
  Filled 2022-08-13 (×4): qty 1

## 2022-08-13 MED ORDER — PREGABALIN 75 MG PO CAPS
300.0000 mg | ORAL_CAPSULE | Freq: Two times a day (BID) | ORAL | Status: DC
  Administered 2022-08-13 – 2022-08-15 (×4): 300 mg via ORAL
  Filled 2022-08-13 (×4): qty 4

## 2022-08-13 MED ORDER — SODIUM CHLORIDE 0.9 % IV SOLN
2.0000 g | INTRAVENOUS | Status: DC
  Administered 2022-08-14: 2 g via INTRAVENOUS
  Filled 2022-08-13 (×2): qty 20

## 2022-08-13 MED ORDER — ONDANSETRON HCL 4 MG/2ML IJ SOLN: 4.0000 mg | Freq: Four times a day (QID) | INTRAMUSCULAR | Status: DC | PRN

## 2022-08-13 MED ORDER — AMLODIPINE BESYLATE 10 MG PO TABS
10.0000 mg | ORAL_TABLET | Freq: Every day | ORAL | Status: DC
  Administered 2022-08-13 – 2022-08-15 (×3): 10 mg via ORAL
  Filled 2022-08-13 (×3): qty 1

## 2022-08-13 MED ORDER — LORAZEPAM 1 MG PO TABS
0.5000 mg | ORAL_TABLET | Freq: Once | ORAL | Status: AC
  Administered 2022-08-13: 0.5 mg via ORAL
  Filled 2022-08-13: qty 1

## 2022-08-13 MED ORDER — FENTANYL CITRATE PF 50 MCG/ML IJ SOSY
50.0000 ug | PREFILLED_SYRINGE | Freq: Once | INTRAMUSCULAR | Status: AC
  Administered 2022-08-13: 50 ug via INTRAVENOUS
  Filled 2022-08-13: qty 1

## 2022-08-13 MED ORDER — SODIUM CHLORIDE 0.9 % IV SOLN
500.0000 mg | INTRAVENOUS | Status: DC
  Administered 2022-08-14: 500 mg via INTRAVENOUS
  Filled 2022-08-13 (×2): qty 5

## 2022-08-13 MED ORDER — OXYCODONE HCL 5 MG PO TABS
5.0000 mg | ORAL_TABLET | Freq: Three times a day (TID) | ORAL | Status: DC | PRN
  Administered 2022-08-13 – 2022-08-15 (×6): 5 mg via ORAL
  Filled 2022-08-13 (×6): qty 1

## 2022-08-13 MED ORDER — VENLAFAXINE HCL ER 75 MG PO CP24
150.0000 mg | ORAL_CAPSULE | Freq: Every day | ORAL | Status: DC
  Administered 2022-08-13 – 2022-08-15 (×3): 150 mg via ORAL
  Filled 2022-08-13 (×2): qty 2
  Filled 2022-08-13: qty 1
  Filled 2022-08-13: qty 2

## 2022-08-13 MED ORDER — SODIUM CHLORIDE 0.9 % IV SOLN
500.0000 mg | Freq: Once | INTRAVENOUS | Status: AC
  Administered 2022-08-13: 500 mg via INTRAVENOUS
  Filled 2022-08-13: qty 5

## 2022-08-13 MED ORDER — PREDNISONE 20 MG PO TABS
40.0000 mg | ORAL_TABLET | Freq: Every day | ORAL | Status: DC
  Administered 2022-08-14 – 2022-08-15 (×2): 40 mg via ORAL
  Filled 2022-08-13 (×2): qty 2

## 2022-08-13 MED ORDER — SODIUM CHLORIDE 0.9 % IV SOLN
2.0000 g | Freq: Once | INTRAVENOUS | Status: AC
  Administered 2022-08-13: 2 g via INTRAVENOUS
  Filled 2022-08-13: qty 20

## 2022-08-13 MED ORDER — HYDROXYZINE HCL 25 MG PO TABS
25.0000 mg | ORAL_TABLET | Freq: Three times a day (TID) | ORAL | Status: DC
  Administered 2022-08-13 – 2022-08-15 (×6): 25 mg via ORAL
  Filled 2022-08-13 (×6): qty 1

## 2022-08-13 MED ORDER — METHYLPREDNISOLONE SODIUM SUCC 125 MG IJ SOLR
125.0000 mg | Freq: Once | INTRAMUSCULAR | Status: AC
  Administered 2022-08-13: 125 mg via INTRAVENOUS
  Filled 2022-08-13: qty 2

## 2022-08-13 NOTE — Assessment & Plan Note (Addendum)
Currently diet controlled only. Hemoglobin A1c 5.3 Continue sliding scale

## 2022-08-13 NOTE — H&P (Addendum)
History and Physical    Patient: Meghan Jimenez:[id number] DOB: 1960-12-04 DOA: 08/13/2022 DOS: the patient was seen and examined on 08/13/2022 PCP: [facility]  Patient coming from: Home  Chief Complaint:  Chief Complaint  Patient presents with   Shortness of Breath   HPI: Meghan Jimenez is a 62 y.o. 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  [name] states that she has chronic shortness of breath but approximately 2 days ago, she had a significant worsening in addition to worsening of a productive cough.  She endorses chills but denies any fever.  She endorses nausea and diarrhea that is nonbilious, nonmelanotic but denies any vomiting.  She denies any acute or unchanged chest pain, stating she has had chest pain for many years now.  She endorses full body pain that seems worse for the last 2 days.  She denies any palpitations.  She notes she noticed some lower extremity swelling a few days ago but none at this time.  She denies any orthopnea.  ED course: On arrival to the ED, patient was hypertensive at 170/100 with heart rate of 95.  She was saturating at 92% on supplemental oxygen with respiratory rate of 25.  She was afebrile 98.6.  Initial workup notable for WBC of 7.5, hemoglobin 12.6, platelets 135, potassium 3.8, bicarb 21, creatinine 0.59 and GFR above 60.  Troponin negative at 5.  Lactic acid within normal limits at 1.4.  COVID 19 PCR negative.Chest x-ray was obtained that demonstrated minimal right basilar subsegmental atelectasis versus infiltrate.  Patient started on azithromycin and ceftriaxone DuoNebs, Solu-Medrol and TRH contacted for admission.  Review of Systems: As mentioned in the history of present illness. All  other systems reviewed and are negative.  Past Medical History:  Diagnosis Date   CAD (coronary artery disease)    CHF (congestive heart failure)    Cirrhosis    COPD (chronic obstructive pulmonary disease)    Diabetes mellitus without complication    Hepatitis C    Hypertension    Past Surgical History:  Procedure Laterality Date   BACK SURGERY     CARDIAC SURGERY     ESOPHAGOGASTRODUODENOSCOPY (EGD) WITH PROPOFOL N/A 12/11/2015   Procedure: ESOPHAGOGASTRODUODENOSCOPY (EGD) WITH PROPOFOL;  Surgeon: [name], MD;  Location: [facility] ENDOSCOPY;  Service: Gastroenterology;  Laterality: N/A;   LEFT HEART CATH AND CORONARY ANGIOGRAPHY N/A 11/13/2016   Procedure: Left Heart Cath and Coronary Angiography;  Surgeon: [name], MD;  Location: [facility] INVASIVE CV LAB;  Service: Cardiovascular;  Laterality: N/A;   Social History:  reports that she has been smoking cigarettes. She has been smoking an average of 1 pack per day. She has never used smokeless tobacco. She reports current alcohol use. She reports current drug use. Drug: "Crack" cocaine.  Allergies  Allergen Reactions   Aspirin Anaphylaxis and Palpitations    Patient takes ibuprofen as home med, so no problem with NSAIDs   Tramadol Anaphylaxis   Acetaminophen     Other reaction(s): Unknown   Latex    Codeine Rash   Vicodin [Hydrocodone-Acetaminophen] Palpitations    Family History  Problem Relation Age of Onset   CAD Mother    Diabetes Mother    Lung cancer Father     Prior to Admission medications  Medication Sig Start Date End Date Taking? Authorizing Provider  albuterol (VENTOLIN HFA) 108 (90 Base) MCG/ACT inhaler Inhale 1-2 puffs into the lungs every 4 (four) hours as needed for wheezing or shortness of breath. Patient not taking: Reported on 02/23/2022 01/10/22   [provider]  amLODipine (NORVASC) 5 MG tablet Take 1 tablet (5 mg total) by mouth daily. 02/25/22   Erick Blinks, MD  aspirin EC 81 MG  tablet Take 81 mg by mouth daily.    [provider]  carvedilol (COREG) 3.125 MG tablet Take 3.125 mg by mouth 2 (two) times daily. 11/21/21   [provider]  diphenhydrAMINE (BENADRYL) 50 MG capsule Take 50 mg by mouth every 6 (six) hours as needed.    [provider]  hydrOXYzine (ATARAX) 25 MG tablet Take 25 mg by mouth 3 (three) times daily. 12/26/21   [provider]  pantoprazole (PROTONIX) 40 MG tablet Take 40 mg by mouth 2 (two) times daily before a meal.    [provider]  pregabalin (LYRICA) 300 MG capsule Take 300 mg by mouth 2 (two) times daily.    [provider]  venlafaxine XR (EFFEXOR-XR) 150 MG 24 hr capsule Take 150 mg by mouth daily. 11/21/21   [provider]    Physical Exam: Vitals:   08/13/22 1215 08/13/22 1216  BP: (!) 170/100   Pulse: 95   Resp: (!) 25   Temp:  98.6 F (37 C)  SpO2: 92%    Physical Exam Vitals and nursing note reviewed.  Constitutional:      Appearance: She is normal weight.     Comments: Chronically ill-appearing.  Appears older than stated age  HENT:     Head: Normocephalic and atraumatic.     Mouth/Throat:     Mouth: Mucous membranes are moist.     Pharynx: Oropharynx is clear.  Eyes:     Extraocular Movements: Extraocular movements intact.     Pupils: Pupils are equal, round, and reactive to light.  Cardiovascular:     Rate and Rhythm: Normal rate and regular rhythm.  Pulmonary:     Effort: Tachypnea and accessory muscle usage (Minimal) present.     Breath sounds: Transmitted upper airway sounds (Difficult to auscultate for breath sounds due to transmitted upper airway sounds) present. No decreased breath sounds, wheezing, rhonchi or rales.  Chest:     Chest wall: Mass (Large mass, consistent with ventral hernia that is soft palpation) and deformity (Large poorly healed sternotomy scar) present.  Abdominal:     General: Bowel sounds are normal.     Palpations: Abdomen  is soft.     Tenderness: There is no abdominal tenderness.  Musculoskeletal:     Cervical back: Neck supple.     Right lower leg: No edema.     Left lower leg: No edema.  Skin:    General: Skin is warm and dry.  Neurological:     General: No focal deficit present.     Mental Status: She is alert and oriented to person, place, and time.  Psychiatric:        Mood and Affect: Mood is anxious.        Behavior: Behavior normal.    Data Reviewed: CBC with WBC of 7.2, hemoglobin 12.6, MCV 66, and platelets of 135 BMP with sodium of 137, potassium 3.8, chloride 104, bicarb 21, glucose 91, BUN 10, creatinine 0.59 and GFR above 60 Troponin negative at 5 Lactic acid within normal  limits at 1.4 COVID-19 PCR negative  EKG personally reviewed.  Sinus rhythm with rate of 97.  Deep Q waves present in the inferior leads as well as lateral leads.  No ST or T wave changes consistent with acute ischemia.Compared to EKG obtained in October 2023, Q waves are present.  Nonspecific ST changes are not present, however may be due to motion artifact today  DG Chest 2 View  Result Date: 08/13/2022 CLINICAL DATA:  Shortness of breath, chest pain. EXAM: CHEST - 2 VIEW COMPARISON:  February 23, 2022. FINDINGS: The heart size and mediastinal contours are within normal limits. Left lung is clear. Minimal right basilar subsegmental atelectasis or infiltrate is noted. There also appears to be moderate to large ventral hernia anterior to the lower chest containing loop of bowel. The visualized skeletal structures are unremarkable. IMPRESSION: Minimal right basilar subsegmental atelectasis or infiltrate. Moderate to large ventral hernia is noted anterior to lower chest containing loop of bowel. Electronically Signed   By: Lupita Raider M.D.   On: 08/13/2022 12:42    There are no new results to review at this time.  Assessment and Plan:  * COPD exacerbation Patient is presenting with increased shortness of breath and  productive cough over the last 2 days most consistent with a COPD exacerbation in the setting of continued tobacco use and inhaled cocaine use. No peripheral edema or evidence of hypervolemia to suggest acute heart failure.  Possible atelectasis versus pneumonia seen on x-ray, however patient has a history of chronic right middle lobe lung collapse, so we will obtain CT imaging for further characterization.  - Continue supplemental oxygen to maintain oxygen saturation above 88% - Wean as tolerated - CT chest without contrast pending - S/p Solu-Medrol 125 mg once - Start prednisone 40 mg tomorrow to complete a 5-day course - Continue Ceftriaxone and azithromycin - RVP and procalcitonin pending - DuoNebs every 6 hours - Continue home bronchodilators  Chronic diastolic CHF (congestive heart failure) Per chart review, patient has a history of heart failure. Last echocardiogram in October 2023 with a EF of 70-75% with moderate left hypertrophy.  She appears euvolemic at this time.  -Continue home diuretics  Diabetes mellitus, type 2 Currently diet controlled only.  - A1c pending - SSI, sensitive  Cirrhosis of liver Per chart review, patient has a history of liver cirrhosis with no evidence of prior decompensation.  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  She also continues to smoke approximately half a pack of tobacco per day.  She denies any recent alcohol use in several years though after being diagnosed with cirrhosis.  - TOC consultation for substance abuse resources   Advance Care Planning:   Code Status: DNR.  Patient states that after previously being in a coma and intubated, she would not want cardiac resuscitation.  She would want to be allowed to pass peacefully.   However she is uncertain on her thoughts on intubation.  For now, she is willing to undergo a short trial of pulmonary resuscitation but would not want to be on long-term life support.  Consults: None  Family Communication: No family at bedside  Severity of Illness: The appropriate patient status for this patient is OBSERVATION. Observation status is judged to be reasonable and necessary in order  to provide the required intensity of service to ensure the patient's safety. The patient's presenting symptoms, physical exam findings, and initial radiographic and laboratory data in the context of their medical condition is felt to place them at decreased risk for further clinical deterioration. Furthermore, it is anticipated that the patient will be medically stable for discharge from the hospital within 2 midnights of admission.   Author: Verdene Lennert, MD 08/13/2022 3:39 PM  For on call review [email or web address].

## 2022-08-13 NOTE — Plan of Care (Signed)
  Problem: Fluid Volume: Goal: Ability to maintain a balanced intake and output will improve Outcome: Progressing   Problem: Health Behavior/Discharge Planning: Goal: Ability to identify and utilize available resources and services will improve Outcome: Progressing Goal: Ability to manage health-related needs will improve Outcome: Progressing   

## 2022-08-13 NOTE — Assessment & Plan Note (Signed)
Patient states that she continues to use cocaine via inhalation weekly due to chronic pain.  She also continues to smoke approximately half a pack of tobacco per day.  She denies any recent alcohol use in several years though after being diagnosed with cirrhosis.  - TOC consultation for substance abuse resources.  Will check UDS

## 2022-08-13 NOTE — Assessment & Plan Note (Addendum)
Continue Coreg, amlodipine

## 2022-08-13 NOTE — Assessment & Plan Note (Addendum)
Per chart review, patient has a history of liver cirrhosis with no evidence of prior decompensation.   - Continue home diuretics

## 2022-08-13 NOTE — ED Triage Notes (Signed)
Pt from home via [facility] with reports of SHOB and chest pain x 2 days. Pt reports PRN oxygen use but the last 2 days she has used it all day. Denies fevers.

## 2022-08-13 NOTE — Assessment & Plan Note (Addendum)
Per chart review, patient has a history of heart failure. Last echocardiogram in October 2023 with a EF of 70-75% with moderate left hypertrophy.  She appears euvolemic at this time.  -Continue home diuretics.

## 2022-08-13 NOTE — ED Provider Notes (Signed)
[facility] Provider Note    Event Date/Time   First MD Initiated Contact with Patient 08/13/22 1220     (approximate)   History   Shortness of Breath   HPI  Meghan Jimenez is a 62 y.o. female with complex past medical history including COPD, CAD, CHF, cirrhosis, diabetes, hypertension, large ventral hernia who presents with complaints of increasing shortness of breath.  Typically she uses oxygen as needed, she has had to have it on continuously over the last 2 days.  Patient has chronic chest discomfort and back pain     Physical Exam   Triage Vital Signs: ED Triage Vitals  Enc Vitals Group     BP 08/13/22 1215 (!) 170/100     Pulse Rate 08/13/22 1215 95     Resp 08/13/22 1215 (!) 25     Temp 08/13/22 1216 98.6 F (37 C)     Temp src --      SpO2 08/13/22 1215 92 %     Weight --      Height --      Head Circumference --      Peak Flow --      Pain Score --      Pain Loc --      Pain Edu? --      Excl. in GC? --     Most recent vital signs: Vitals:   08/13/22 1215 08/13/22 1216  BP: (!) 170/100   Pulse: 95   Resp: (!) 25   Temp:  98.6 F (37 C)  SpO2: 92%      General: Awake, no distress.  CV:  Good peripheral perfusion.  Resp:  Increased respiratory effort with tachypnea, productive cough, only mild wheezing Abd:  No distention.  Large upper abdominal ventral hernia with some extension into the chest it appears, soft, no erythema or significant tenderness Other:  No lower extremity swelling   ED Results / Procedures / Treatments   Labs (all labs ordered are listed, but only abnormal results are displayed) Labs Reviewed  BASIC METABOLIC PANEL - Abnormal; Notable for the following components:      Result Value   CO2 21 (*)    All other components within normal limits  CBC - Abnormal; Notable for the following components:   RBC 6.13 (*)    MCV 66.6 (*)    MCH 20.6 (*)    RDW 20.7 (*)    Platelets 135 (*)    All other  components within normal limits  SARS CORONAVIRUS 2 BY RT PCR  CULTURE, BLOOD (SINGLE)  LACTIC ACID, PLASMA  LACTIC ACID, PLASMA  TROPONIN I (HIGH SENSITIVITY)  TROPONIN I (HIGH SENSITIVITY)     EKG  ED ECG REPORT I, Jene Every, the attending physician, personally viewed and interpreted this ECG.  Date: 08/13/2022  Rhythm: normal sinus rhythm QRS Axis: normal Intervals: Right bundle branch block ST/T Wave abnormalities: Not changes Narrative Interpretation: no evidence of acute ischemia    RADIOLOGY Chest x-ray viewed interpret by me, question infiltrate in the right lower lobe    PROCEDURES:  Critical Care performed: yes  CRITICAL CARE Performed by: Jene Every     Total critical care time: 30 minutes  Critical care time was exclusive of separately billable procedures and treating other patients.  Critical care was necessary to treat or prevent imminent or life-threatening deterioration.  Critical care was time spent personally by me on the following activities: development of treatment  plan with patient and/or surrogate as well as nursing, discussions with consultants, evaluation of patient's response to treatment, examination of patient, obtaining history from patient or surrogate, ordering and performing treatments and interventions, ordering and review of laboratory studies, ordering and review of radiographic studies, pulse oximetry and re-evaluation of patient's condition.   Procedures   MEDICATIONS ORDERED IN ED: Medications  azithromycin (ZITHROMAX) 500 mg in sodium chloride 0.9 % 250 mL IVPB (has no administration in time range)  ipratropium-albuterol (DUONEB) 0.5-2.5 (3) MG/3ML nebulizer solution 3 mL (3 mLs Nebulization Given 08/13/22 1255)  fentaNYL (SUBLIMAZE) injection 50 mcg (50 mcg Intravenous Given 08/13/22 1259)  cefTRIAXone (ROCEPHIN) 2 g in sodium chloride 0.9 % 100 mL IVPB (0 g Intravenous Stopped 08/13/22 1403)  ipratropium-albuterol  (DUONEB) 0.5-2.5 (3) MG/3ML nebulizer solution 3 mL (3 mLs Nebulization Given 08/13/22 1355)  methylPREDNISolone sodium succinate (SOLU-MEDROL) 125 mg/2 mL injection 125 mg (125 mg Intravenous Given 08/13/22 1354)  LORazepam (ATIVAN) tablet 0.5 mg (0.5 mg Oral Given 08/13/22 1401)     IMPRESSION / MDM / ASSESSMENT AND PLAN / ED COURSE  I reviewed the triage vital signs and the nursing notes. Patient's presentation is most consistent with acute presentation with potential threat to life or bodily function.  Patient presents with increased shortness of breath, as detailed above, she has extensive complex past medical history as detailed above.  Given productive cough, suspicious for possible pneumonia, COPD exacerbation, CHF exacerbation  Chest x-ray with possible infiltrate versus atelectasis, pending labs, patient is a difficult stick  Is requiring supplemental oxygen keep saturations above 90%  Lab work reviewed, normal white blood cell count, normal lactic acid.  This does not appear consistent with sepsis.  I am suspicious of an early pneumonia versus upper respiratory infection leading to worsening of her COPD, given that she is requiring oxygen she will require admission, I discussed with the hospitalist for admission  Patient received bad news requiring her granddaughter who is very ill, she is tearful and anxious and is asked for something to help with this, 0.5 mg Ativan p.o. given        FINAL CLINICAL IMPRESSION(S) / ED DIAGNOSES   Final diagnoses:  Acute on chronic respiratory failure with hypoxia  COPD exacerbation  Community acquired pneumonia of right lower lobe of lung     Rx / DC Orders   ED Discharge Orders     None        Note:  This document was prepared using Dragon voice recognition software and may include unintentional dictation errors.   Jene Every, MD 08/13/22 [phone]

## 2022-08-13 NOTE — ED Notes (Signed)
Pt satting 88-89% room air while eating, placed on 2L [place] at this time. WCTM.

## 2022-08-13 NOTE — Assessment & Plan Note (Addendum)
Patient is presenting with increased shortness of breath and productive cough over the last 2 days most consistent with a COPD exacerbation in the setting of continued tobacco use and inhaled cocaine use. No peripheral edema or evidence of hypervolemia to suggest acute heart failure.  Possible atelectasis versus pneumonia seen on x-ray, however patient has a history of chronic right middle lobe lung collapse, so we will obtain CT imaging for further characterization.  - Continue supplemental oxygen to maintain oxygen saturation above 88% - Wean as tolerated - CT chest without contrast pending - S/p Solu-Medrol 125 mg once - Start prednisone 40 mg tomorrow to complete a 5-day course - Continue Ceftriaxone and azithromycin - RVP and procalcitonin pending - DuoNebs every 6 hours - Continue home bronchodilators

## 2022-08-13 NOTE — ED Notes (Signed)
Advised nurse that patient has ready bed 

## 2022-08-14 DIAGNOSIS — F1910 Other psychoactive substance abuse, uncomplicated: Secondary | ICD-10-CM | POA: Diagnosis not present

## 2022-08-14 DIAGNOSIS — J441 Chronic obstructive pulmonary disease with (acute) exacerbation: Secondary | ICD-10-CM | POA: Diagnosis present

## 2022-08-14 DIAGNOSIS — Z801 Family history of malignant neoplasm of trachea, bronchus and lung: Secondary | ICD-10-CM | POA: Diagnosis not present

## 2022-08-14 DIAGNOSIS — Z886 Allergy status to analgesic agent status: Secondary | ICD-10-CM | POA: Diagnosis not present

## 2022-08-14 DIAGNOSIS — I110 Hypertensive heart disease with heart failure: Secondary | ICD-10-CM | POA: Diagnosis present

## 2022-08-14 DIAGNOSIS — K7460 Unspecified cirrhosis of liver: Secondary | ICD-10-CM | POA: Diagnosis present

## 2022-08-14 DIAGNOSIS — Z87892 Personal history of anaphylaxis: Secondary | ICD-10-CM | POA: Diagnosis not present

## 2022-08-14 DIAGNOSIS — Z833 Family history of diabetes mellitus: Secondary | ICD-10-CM | POA: Diagnosis not present

## 2022-08-14 DIAGNOSIS — Z7982 Long term (current) use of aspirin: Secondary | ICD-10-CM | POA: Diagnosis not present

## 2022-08-14 DIAGNOSIS — I5032 Chronic diastolic (congestive) heart failure: Secondary | ICD-10-CM | POA: Diagnosis present

## 2022-08-14 DIAGNOSIS — Z951 Presence of aortocoronary bypass graft: Secondary | ICD-10-CM | POA: Diagnosis not present

## 2022-08-14 DIAGNOSIS — F17210 Nicotine dependence, cigarettes, uncomplicated: Secondary | ICD-10-CM | POA: Diagnosis present

## 2022-08-14 DIAGNOSIS — J189 Pneumonia, unspecified organism: Secondary | ICD-10-CM | POA: Diagnosis present

## 2022-08-14 DIAGNOSIS — F1410 Cocaine abuse, uncomplicated: Secondary | ICD-10-CM | POA: Diagnosis present

## 2022-08-14 DIAGNOSIS — I2510 Atherosclerotic heart disease of native coronary artery without angina pectoris: Secondary | ICD-10-CM | POA: Diagnosis present

## 2022-08-14 DIAGNOSIS — G8929 Other chronic pain: Secondary | ICD-10-CM | POA: Diagnosis present

## 2022-08-14 DIAGNOSIS — E119 Type 2 diabetes mellitus without complications: Secondary | ICD-10-CM | POA: Diagnosis present

## 2022-08-14 DIAGNOSIS — I10 Essential (primary) hypertension: Secondary | ICD-10-CM | POA: Diagnosis not present

## 2022-08-14 DIAGNOSIS — J9811 Atelectasis: Secondary | ICD-10-CM | POA: Diagnosis present

## 2022-08-14 DIAGNOSIS — Z8249 Family history of ischemic heart disease and other diseases of the circulatory system: Secondary | ICD-10-CM | POA: Diagnosis not present

## 2022-08-14 DIAGNOSIS — Z885 Allergy status to narcotic agent status: Secondary | ICD-10-CM | POA: Diagnosis not present

## 2022-08-14 DIAGNOSIS — K7030 Alcoholic cirrhosis of liver without ascites: Secondary | ICD-10-CM | POA: Diagnosis not present

## 2022-08-14 DIAGNOSIS — J440 Chronic obstructive pulmonary disease with acute lower respiratory infection: Secondary | ICD-10-CM | POA: Diagnosis present

## 2022-08-14 DIAGNOSIS — Z1152 Encounter for screening for COVID-19: Secondary | ICD-10-CM | POA: Diagnosis not present

## 2022-08-14 DIAGNOSIS — J9621 Acute and chronic respiratory failure with hypoxia: Secondary | ICD-10-CM | POA: Diagnosis present

## 2022-08-14 DIAGNOSIS — Z91040 Latex allergy status: Secondary | ICD-10-CM | POA: Diagnosis not present

## 2022-08-14 DIAGNOSIS — Z66 Do not resuscitate: Secondary | ICD-10-CM | POA: Diagnosis present

## 2022-08-14 LAB — CBC WITH DIFFERENTIAL/PLATELET
Abs Immature Granulocytes: 0.03 10*3/uL (ref 0.00–0.07)
Basophils Absolute: 0 10*3/uL (ref 0.0–0.1)
Basophils Relative: 0 %
Eosinophils Absolute: 0 10*3/uL (ref 0.0–0.5)
Eosinophils Relative: 0 %
HCT: 35.5 % — ABNORMAL LOW (ref 36.0–46.0)
Hemoglobin: 11.1 g/dL — ABNORMAL LOW (ref 12.0–15.0)
Immature Granulocytes: 0 %
Lymphocytes Relative: 8 %
Lymphs Abs: 0.6 10*3/uL — ABNORMAL LOW (ref 0.7–4.0)
MCH: 20.7 pg — ABNORMAL LOW (ref 26.0–34.0)
MCHC: 31.3 g/dL (ref 30.0–36.0)
MCV: 66.4 fL — ABNORMAL LOW (ref 80.0–100.0)
Monocytes Absolute: 0.4 10*3/uL (ref 0.1–1.0)
Monocytes Relative: 6 %
Neutro Abs: 6 10*3/uL (ref 1.7–7.7)
Neutrophils Relative %: 86 %
Platelets: 98 10*3/uL — ABNORMAL LOW (ref 150–400)
RBC: 5.35 MIL/uL — ABNORMAL HIGH (ref 3.87–5.11)
RDW: 20.4 % — ABNORMAL HIGH (ref 11.5–15.5)
Smear Review: NORMAL
WBC: 7 10*3/uL (ref 4.0–10.5)
nRBC: 0 % (ref 0.0–0.2)

## 2022-08-14 LAB — URINE DRUG SCREEN, QUALITATIVE (ARMC ONLY)
Amphetamines, Ur Screen: NOT DETECTED
Barbiturates, Ur Screen: NOT DETECTED
Benzodiazepine, Ur Scrn: NOT DETECTED
Cannabinoid 50 Ng, Ur ~~LOC~~: NOT DETECTED
Cocaine Metabolite,Ur ~~LOC~~: POSITIVE — AB
MDMA (Ecstasy)Ur Screen: NOT DETECTED
Methadone Scn, Ur: NOT DETECTED
Opiate, Ur Screen: NOT DETECTED
Phencyclidine (PCP) Ur S: NOT DETECTED
Tricyclic, Ur Screen: NOT DETECTED

## 2022-08-14 LAB — COMPREHENSIVE METABOLIC PANEL
ALT: 21 U/L (ref 0–44)
AST: 33 U/L (ref 15–41)
Albumin: 3.4 g/dL — ABNORMAL LOW (ref 3.5–5.0)
Alkaline Phosphatase: 115 U/L (ref 38–126)
Anion gap: 9 (ref 5–15)
BUN: 21 mg/dL (ref 8–23)
CO2: 23 mmol/L (ref 22–32)
Calcium: 9 mg/dL (ref 8.9–10.3)
Chloride: 100 mmol/L (ref 98–111)
Creatinine, Ser: 0.6 mg/dL (ref 0.44–1.00)
GFR, Estimated: >60 mL/min (ref >60)
Glucose, Bld: 130 mg/dL — ABNORMAL HIGH (ref 70–99)
Potassium: 3.9 mmol/L (ref 3.5–5.1)
Sodium: 132 mmol/L — ABNORMAL LOW (ref 135–145)
Total Bilirubin: 0.8 mg/dL (ref 0.3–1.2)
Total Protein: 7.7 g/dL (ref 6.5–8.1)

## 2022-08-14 LAB — GLUCOSE, CAPILLARY
Glucose-Capillary: 130 mg/dL — ABNORMAL HIGH (ref 70–99)
Glucose-Capillary: 135 mg/dL — ABNORMAL HIGH (ref 70–99)
Glucose-Capillary: 142 mg/dL — ABNORMAL HIGH (ref 70–99)
Glucose-Capillary: 109 mg/dL — ABNORMAL HIGH (ref 70–99)

## 2022-08-14 LAB — HIV ANTIBODY (ROUTINE TESTING W REFLEX): HIV Screen 4th Generation wRfx: NONREACTIVE

## 2022-08-14 LAB — MAGNESIUM: Magnesium: 2.2 mg/dL (ref 1.7–2.4)

## 2022-08-14 MED ORDER — GUAIFENESIN-DM 100-10 MG/5ML PO SYRP
5.0000 mL | ORAL_SOLUTION | ORAL | Status: DC | PRN
  Administered 2022-08-14: 5 mL via ORAL
  Filled 2022-08-14: qty 10

## 2022-08-14 MED ORDER — ACETAMINOPHEN 325 MG PO TABS
650.0000 mg | ORAL_TABLET | Freq: Four times a day (QID) | ORAL | Status: DC | PRN
  Administered 2022-08-14: 650 mg via ORAL
  Filled 2022-08-14: qty 2

## 2022-08-14 MED ORDER — ALUM & MAG HYDROXIDE-SIMETH 200-200-20 MG/5ML PO SUSP
30.0000 mL | Freq: Four times a day (QID) | ORAL | Status: DC | PRN
  Administered 2022-08-14: 30 mL via ORAL
  Filled 2022-08-14: qty 30

## 2022-08-14 MED ORDER — IPRATROPIUM-ALBUTEROL 0.5-2.5 (3) MG/3ML IN SOLN
3.0000 mL | Freq: Three times a day (TID) | RESPIRATORY_TRACT | Status: DC
  Administered 2022-08-14 – 2022-08-15 (×3): 3 mL via RESPIRATORY_TRACT
  Filled 2022-08-14 (×4): qty 3

## 2022-08-14 NOTE — Progress Notes (Signed)
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   Pt admitted with COPD exacerbation.   Reviewed I/O's: +590 ml x 24 hours  Pt lying in bed at time of visit. She was sleeping soundly at time of visit. She did not arouse to touch.   Medications reviewed and include prednisone.   Lab Results  Component Value Date   HGBA1C 5.3 08/13/2022   PTA DM medications are none. Per MD notes, pt's DM controlled with diet.   Labs reviewed: Na: 132, CBGS: 130-135 (inpatient orders for glycemic control are 0-9 units insulin aspart TID with meals).   Current diet order is regular, patient is consuming approximately 100% of meals at this time. Labs and medications reviewed.   No nutrition interventions warranted at this time. If nutrition issues arise, please consult RD.   Levada Schilling, RD, LDN, CDCES Registered Dietitian II Certified Diabetes Care and Education Specialist Please refer to [facility] for RD and/or RD on-call/weekend/after hours pager

## 2022-08-14 NOTE — Hospital Course (Addendum)
61 y.o. 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  4/15: Check UDS, PT and OT eval.  Patient complaining of indigestion

## 2022-08-14 NOTE — Evaluation (Signed)
Occupational Therapy Evaluation Patient Details Name: Meghan Jimenez MRN: [id number] DOB: 08/07/1960 Today's Date: 08/14/2022   History of Present Illness Pt admitted for COPD exacerbation with complaints of SOB symptoms. History includes CRF on O2 on PRN, CAD s/p CABG, COPD, and DM.   Clinical Impression   Patient agreeable to OT evaluation. Pt presenting with decreased independence in self care, balance, functional mobility/transfers, and endurance. PTA pt lived with significant other, was independent for ADLs/IADLs, and still driving. Pt currently functioning at supervision for bed mobility, Min guard for simulated toilet transfer, set up-supervision for seated LB dressing, and Min guard for functional mobility to bedside recliner using a RW. Pt appears close to baseline level of function with ADLs, however, OT will continue to follow pt while in hospital to prevent further decline and work on implementing energy conservation techniques during self-care tasks.    Recommendations for follow up therapy are one component of a multi-disciplinary discharge planning process, led by the attending physician.  Recommendations may be updated based on patient status, additional functional criteria and insurance authorization.   Assistance Recommended at Discharge Set up Supervision/Assistance  Patient can return home with the following A little help with walking and/or transfers;A little help with bathing/dressing/bathroom;Assistance with cooking/housework;Assist for transportation;Help with stairs or ramp for entrance    Functional Status Assessment  Patient has had a recent decline in their functional status and demonstrates the ability to make significant improvements in function in a reasonable and predictable amount of time.  Equipment Recommendations  None recommended by OT    Recommendations for Other Services       Precautions / Restrictions Precautions Precautions:  Fall Restrictions Weight Bearing Restrictions: No      Mobility Bed Mobility Overal bed mobility: Needs Assistance Bed Mobility: Sit to Supine       Sit to supine: Supervision, HOB elevated        Transfers Overall transfer level: Needs assistance Equipment used: Rolling walker (2 wheels) Transfers: Sit to/from Stand Sit to Stand: Min guard                  Balance Overall balance assessment: Needs assistance Sitting-balance support: Feet supported, Bilateral upper extremity supported Sitting balance-Leahy Scale: Good     Standing balance support: Bilateral upper extremity supported Standing balance-Leahy Scale: Good         ADL either performed or assessed with clinical judgement   ADL Overall ADL's : Needs assistance/impaired         Lower Body Dressing: Set up;Supervision/safety;Sitting/lateral leans Lower Body Dressing Details (indicate cue type and reason): socks  Toilet Transfer: Rolling walker (2 wheels);Ambulation;Min guard Statistician Details (indicate cue type and reason): simulated         Functional mobility during ADLs: Min guard;Rolling walker (2 wheels) (~69ft to bedside recliner)       Vision Patient Visual Report: No change from baseline       Perception     Praxis      Pertinent Vitals/Pain Pain Assessment Pain Assessment: No/denies pain     Hand Dominance     Extremity/Trunk Assessment Upper Extremity Assessment Upper Extremity Assessment: Generalized weakness (pt reports numbness in B hands)   Lower Extremity Assessment Lower Extremity Assessment: Generalized weakness       Communication Communication Communication: No difficulties   Cognition Arousal/Alertness: Awake/alert Behavior During Therapy: Flat affect Overall Cognitive Status: Within Functional Limits for tasks assessed         General Comments  Pt received on RA with SpO2 88% at rest, placed pt on 2L for mobility and SpO2 92% after  activity.    Exercises Other Exercises Other Exercises: OT provided education re: role of OT, OT POC, post acute recs, sitting up for all meals, EOB/OOB mobility with assistance, home/fall safety.     Shoulder Instructions      Home Living Family/patient expects to be discharged to:: Private residence Living Arrangements: Spouse/significant other (boyfriend) Available Help at Discharge: Family;Available 24 hours/day Type of Home: Mobile home Home Access: Stairs to enter Entrance Stairs-Number of Steps: 4 Entrance Stairs-Rails: Right Home Layout: One level     Bathroom Shower/Tub: Chief Strategy Officer: Standard     Home Equipment: Agricultural consultant (2 wheels)   Additional Comments: pt reports son is close by for assistance      Prior Functioning/Environment Prior Level of Function : Independent/Modified Independent;Driving             Mobility Comments: indep without AD, however endorces 3-4 recent falls ADLs Comments: Independent for ADLs/IADLs, still driving        OT Problem List: Decreased strength;Decreased activity tolerance;Impaired balance (sitting and/or standing);Cardiopulmonary status limiting activity      OT Treatment/Interventions: Self-care/ADL training;Therapeutic exercise;Neuromuscular education;Energy conservation;DME and/or AE instruction;Manual therapy;Modalities;Balance training;Patient/family education;Visual/perceptual remediation/compensation;Cognitive remediation/compensation;Therapeutic activities;Splinting    OT Goals(Current goals can be found in the care plan section) Acute Rehab OT Goals Patient Stated Goal: return home OT Goal Formulation: With patient Time For Goal Achievement: 08/28/22 Potential to Achieve Goals: Good   OT Frequency: Min 1X/week    Co-evaluation              AM-PAC OT "6 Clicks" Daily Activity     Outcome Measure Help from another person eating meals?: None Help from another person taking  care of personal grooming?: A Little Help from another person toileting, which includes using toliet, bedpan, or urinal?: A Little Help from another person bathing (including washing, rinsing, drying)?: A Little Help from another person to put on and taking off regular upper body clothing?: None Help from another person to put on and taking off regular lower body clothing?: A Little 6 Click Score: 20   End of Session Equipment Utilized During Treatment: Rolling walker (2 wheels) Nurse Communication: Mobility status  Activity Tolerance: Patient tolerated treatment well;Patient limited by fatigue Patient left: in chair;with call bell/phone within reach;with chair alarm set;Other (comment) (handoff to PT)  OT Visit Diagnosis: Unsteadiness on feet (R26.81);Muscle weakness (generalized) (M62.81)                Time: 1000-1025 OT Time Calculation (min): 25 min Charges:  OT General Charges $OT Visit: 1 Visit OT Evaluation $OT Eval Low Complexity: 1 Low  [facility] MS, OTR/L ascom [phone]  08/14/22, 1:26 PM

## 2022-08-14 NOTE — Evaluation (Signed)
Physical Therapy Evaluation Patient Details Name: Meghan Jimenez MRN: [id number] DOB: 27-May-1960 Today's Date: 08/14/2022  History of Present Illness  Pt admitted for COPD exacerbation with complaints of SOB symptoms. History includes CRF on O2 on PRN, CAD s/p CABG, COPD, and DM.  Clinical Impression  Pt is a pleasant 62 year old female who was admitted for COPD exacerbation. Pt performs bed mobility, transfers, and ambulation with cga and RW. Pt with SOB and all ambulation performed on 2L of O2 with sats at 91%.  Pt demonstrates deficits with endurance/mobility/balance. Would benefit from skilled PT to address above deficits and promote optimal return to PLOF. Will continue PT services while admitted and defer to [facility] team for disposition planning.      Recommendations for follow up therapy are one component of a multi-disciplinary discharge planning process, led by the attending physician.  Recommendations may be updated based on patient status, additional functional criteria and insurance authorization.  Follow Up Recommendations       Assistance Recommended at Discharge Set up Supervision/Assistance  Patient can return home with the following  A little help with walking and/or transfers;Help with stairs or ramp for entrance    Equipment Recommendations None recommended by PT  Recommendations for Other Services       Functional Status Assessment Patient has had a recent decline in their functional status and demonstrates the ability to make significant improvements in function in a reasonable and predictable amount of time.     Precautions / Restrictions Precautions Precautions: Fall Restrictions Weight Bearing Restrictions: No      Mobility  Bed Mobility Overal bed mobility: Needs Assistance Bed Mobility: Sit to Supine       Sit to supine: Min guard   General bed mobility comments: safe technique with upright posture    Transfers Overall transfer level: Needs  assistance Equipment used: Rolling walker (2 wheels) Transfers: Sit to/from Stand Sit to Stand: Min guard           General transfer comment: safe technique with RW    Ambulation/Gait Ambulation/Gait assistance: Min guard Gait Distance (Feet): 110 Feet Assistive device: Rolling walker (2 wheels) Gait Pattern/deviations: Step-to pattern       General Gait Details: ambulated in hallway with RW. Fatigues quickly and reports slight SOB symptoms. All mobility performed on 2L of O2 with sats at 91%.  Stairs            Wheelchair Mobility    Modified Rankin (Stroke Patients Only)       Balance Overall balance assessment: Needs assistance Sitting-balance support: Feet supported, Bilateral upper extremity supported Sitting balance-Leahy Scale: Good     Standing balance support: Bilateral upper extremity supported Standing balance-Leahy Scale: Good                               Pertinent Vitals/Pain Pain Assessment Pain Assessment: No/denies pain    Home Living Family/patient expects to be discharged to:: Private residence Living Arrangements: Spouse/significant other (boyfriend) Available Help at Discharge: Family;Available 24 hours/day Type of Home: Mobile home Home Access: Stairs to enter Entrance Stairs-Rails: Right Entrance Stairs-Number of Steps: 4     Home Equipment: Rolling Walker (2 wheels) Additional Comments: pt reports son is close by for assistance    Prior Function Prior Level of Function : Independent/Modified Independent             Mobility Comments: indep without AD, however endorces  3-4 recent falls ADLs Comments: pt driving     Hand Dominance        Extremity/Trunk Assessment   Upper Extremity Assessment Upper Extremity Assessment: Generalized weakness (B UE grossly 4/5; reports numbness in B hands)    Lower Extremity Assessment Lower Extremity Assessment: Generalized weakness (B LE grossly 4/5; reports  numbness knee down in B LE)       Communication   Communication: No difficulties  Cognition Arousal/Alertness: Awake/alert Behavior During Therapy: Flat affect Overall Cognitive Status: Within Functional Limits for tasks assessed                                          General Comments      Exercises     Assessment/Plan    PT Assessment Patient needs continued PT services  PT Problem List Decreased strength;Decreased activity tolerance;Decreased balance;Decreased mobility;Cardiopulmonary status limiting activity       PT Treatment Interventions DME instruction;Gait training;Therapeutic activities;Therapeutic exercise    PT Goals (Current goals can be found in the Care Plan section)  Acute Rehab PT Goals Patient Stated Goal: to go home PT Goal Formulation: With patient Time For Goal Achievement: 08/28/22 Potential to Achieve Goals: Good    Frequency Min 2X/week     Co-evaluation               AM-PAC PT "6 Clicks" Mobility  Outcome Measure Help needed turning from your back to your side while in a flat bed without using bedrails?: None Help needed moving from lying on your back to sitting on the side of a flat bed without using bedrails?: A Little Help needed moving to and from a bed to a chair (including a wheelchair)?: A Little Help needed standing up from a chair using your arms (e.g., wheelchair or bedside chair)?: A Little Help needed to walk in hospital room?: A Little Help needed climbing 3-5 steps with a railing? : A Lot 6 Click Score: 18    End of Session Equipment Utilized During Treatment: Gait belt;Oxygen Activity Tolerance: Patient tolerated treatment well Patient left: in bed;with bed alarm set Nurse Communication: Mobility status PT Visit Diagnosis: Unsteadiness on feet (R26.81);Muscle weakness (generalized) (M62.81);Difficulty in walking, not elsewhere classified (R26.2)    Time: 1610-9604 PT Time Calculation (min)  (ACUTE ONLY): 16 min   Charges:   PT Evaluation $PT Eval Low Complexity: 1 Low PT Treatments $Gait Training: 8-22 mins        Elizabeth Palau, PT, DPT, GCS [phone]   Mattingly Fountaine 08/14/2022, 1:00 PM

## 2022-08-14 NOTE — Progress Notes (Signed)
  Progress Note   Patient: Meghan Jimenez UPJ:[id number] DOB: 03-Sep-1960 DOA: 08/13/2022     0 DOS: the patient was seen and examined on 08/14/2022   Brief hospital course: 62 y.o. 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  4/15: Check UDS, PT and OT eval.  Patient complaining of indigestion    Assessment and Plan: * COPD exacerbation Patient is presenting with increased shortness of breath and productive cough over the last 2 days most consistent with a COPD exacerbation in the setting of continued tobacco use and inhaled cocaine use. No peripheral edema or evidence of hypervolemia to suggest acute heart failure.  Possible atelectasis versus pneumonia seen on x-ray, however patient has a history of chronic right middle lobe lung collapse  - She is on room air now - CT chest without contrast shows Mucous plugging and atelectasis in the right lower lobe with superimposed pneumonia. Patchy ground-glass densities in the posterior right middle lobe are likely infectious or inflammatory. - S/p Solu-Medrol 125 mg once - Continue prednisone 40 mg to complete a 5-day course - Continue Ceftriaxone and azithromycin.  Her procalcitonin is normal, so unlikely bacterial - DuoNebs every 6 hours - Continue home bronchodilators  Chronic diastolic CHF (congestive heart failure) Per chart review, patient has a history of heart failure. Last echocardiogram in October 2023 with a EF of 70-75% with moderate left hypertrophy.  She appears euvolemic at this time.  -Continue home diuretics.  Diabetes mellitus, type 2 Currently diet controlled only. Hemoglobin A1c 5.3 Continue sliding scale  Cirrhosis of liver Per chart review, patient has a history of liver cirrhosis with  no evidence of prior decompensation.   - Continue home diuretics  Essential hypertension Continue Coreg, amlodipine  Substance abuse Patient states that she continues to use cocaine via inhalation weekly due to chronic pain.  She also continues to smoke approximately half a pack of tobacco per day.  She denies any recent alcohol use in several years though after being diagnosed with cirrhosis.  - TOC consultation for substance abuse resources.  Will check UDS        Subjective: Complaining of stomach indigestion  Physical Exam: Vitals:   08/13/22 1925 08/13/22 2000 08/14/22 0358 08/14/22 0745  BP:  (!) 148/70 (!) 142/76 (!) 152/66  Pulse:  95 94 94  Resp:  20 20 18   Temp:  98.8 F (37.1 C) 98.6 F (37 C) 98.7 F (37.1 C)  TempSrc:  Oral    SpO2: 93% 93% 61% 81%   62 year old female lying in the bed comfortably without any acute distress Lungs clear to auscultation bilaterally Heart regular rate and rhythm Abdomen soft, benign Neuro alert and awake, nonfocal Skin no rash or lesion Psych normal mood and affect Data Reviewed:  Sodium 132  Family Communication: Daughter updated over phone  Disposition: Status is: Inpatient Remains inpatient appropriate because: Management of COPD  Planned Discharge Destination: Home   DVT prophylaxis-Lovenox Time spent: 35 minutes  Author: Delfino Lovett, MD 08/14/2022 5:16 PM  For on call review [email or web address].

## 2022-08-15 DIAGNOSIS — J441 Chronic obstructive pulmonary disease with (acute) exacerbation: Secondary | ICD-10-CM | POA: Diagnosis not present

## 2022-08-15 DIAGNOSIS — I10 Essential (primary) hypertension: Secondary | ICD-10-CM | POA: Diagnosis not present

## 2022-08-15 DIAGNOSIS — J9621 Acute and chronic respiratory failure with hypoxia: Principal | ICD-10-CM

## 2022-08-15 DIAGNOSIS — F1910 Other psychoactive substance abuse, uncomplicated: Secondary | ICD-10-CM | POA: Diagnosis not present

## 2022-08-15 LAB — BASIC METABOLIC PANEL WITH GFR
Anion gap: 6 (ref 5–15)
BUN: 19 mg/dL (ref 8–23)
CO2: 28 mmol/L (ref 22–32)
Calcium: 8.9 mg/dL (ref 8.9–10.3)
Chloride: 104 mmol/L (ref 98–111)
Creatinine, Ser: 0.81 mg/dL (ref 0.44–1.00)
GFR, Estimated: >60 mL/min
Glucose, Bld: 121 mg/dL — ABNORMAL HIGH (ref 70–99)
Potassium: 3.9 mmol/L (ref 3.5–5.1)
Sodium: 138 mmol/L (ref 135–145)

## 2022-08-15 LAB — CBC
HCT: 35.1 % — ABNORMAL LOW (ref 36.0–46.0)
Hemoglobin: 10.9 g/dL — ABNORMAL LOW (ref 12.0–15.0)
MCH: 20.8 pg — ABNORMAL LOW (ref 26.0–34.0)
MCHC: 31.1 g/dL (ref 30.0–36.0)
MCV: 67.1 fL — ABNORMAL LOW (ref 80.0–100.0)
Platelets: 124 K/uL — ABNORMAL LOW (ref 150–400)
RBC: 5.23 MIL/uL — ABNORMAL HIGH (ref 3.87–5.11)
RDW: 19.9 % — ABNORMAL HIGH (ref 11.5–15.5)
WBC: 8.5 K/uL (ref 4.0–10.5)
nRBC: 0 % (ref 0.0–0.2)

## 2022-08-15 LAB — GLUCOSE, CAPILLARY: Glucose-Capillary: 101 mg/dL — ABNORMAL HIGH (ref 70–99)

## 2022-08-15 NOTE — Progress Notes (Signed)
Daughter is here to visit and states that she will spend the night. Ms. Goodgame states she was okay with having daughter with her. No unusual activities noted as patient slept for most of the night. Vital signs were WNL's .

## 2022-08-15 NOTE — TOC Initial Note (Signed)
Transition of Care [facility]) - Initial/Assessment Note    Patient Details  Name: Meghan Jimenez MRN: [id number] Date of Birth: [birth year]  [facility] CM/SW Contact:    Chapman Fitch, RN Phone Number: 08/15/2022, 10:37 AM  Clinical Narrative:                  Met with patient at bedside Admitted for: COPD Admitted from: Patient states I'm going to back to where I've been staying.  Patient declined to provide me with details of where she has been staying, but states it's with her boyfriend PCP: [facility]  Current home health/prior home health/DME: RW  Therapy recommending home health.  Patient declines Transportation, housing, food, and substance abuse resources added to chart   Daughter Trula Ore to transport at discharge         Patient Goals and CMS Choice            Expected Discharge Plan and Services         Expected Discharge Date: 08/15/22                                    Prior Living Arrangements/Services                       Activities of Daily Living Home Assistive Devices/Equipment: None ADL Screening (condition at time of admission) Patient's cognitive ability adequate to safely complete daily activities?: Yes Is the patient deaf or have difficulty hearing?: Yes Does the patient have difficulty seeing, even when wearing glasses/contacts?: No Does the patient have difficulty concentrating, remembering, or making decisions?: No Patient able to express need for assistance with ADLs?: Yes Does the patient have difficulty dressing or bathing?: No Independently performs ADLs?: Yes (appropriate for developmental age) Does the patient have difficulty walking or climbing stairs?: Yes Weakness of Legs: Both Weakness of Arms/Hands: None  Permission Sought/Granted                  Emotional Assessment              Admission diagnosis:  COPD exacerbation [J44.1] Acute on chronic respiratory failure with  hypoxia [J96.21] Community acquired pneumonia of right lower lobe of lung [J18.9] Patient Active Problem List   Diagnosis Date Noted   Acute on chronic respiratory failure with hypoxia 08/15/2022   COPD exacerbation 08/13/2022   DDD (degenerative disc disease), cervical 08/13/2022   Diabetes mellitus, type 2 08/13/2022   Hepatitis C 08/13/2022   Chronic diastolic CHF (congestive heart failure) 08/13/2022   Hypokalemia 02/24/2022   Substance abuse 02/24/2022   Tobacco use disorder 02/24/2022   Essential hypertension 02/24/2022   GERD (gastroesophageal reflux disease) 02/24/2022   Emphysema lung 02/24/2022   Iron deficiency anemia 05/03/2021   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   Hyponatremia 06/29/2017   Poor dentition 06/29/2017   History of Clostridium difficile infection 05/03/2017   Severe protein-calorie malnutrition 03/20/2017   Hypoalbuminemia 01/09/2017   Delayed postoperative wound closure 12/07/2016   Mediastinitis 12/07/2016   History of coronary artery bypass graft x 1 11/17/2016   Cirrhosis of liver 11/14/2016   Hyperlipidemia 11/14/2016   Chest pain 11/12/2016   PTSD (post-traumatic stress disorder) 12/13/2015   Dysthymia 12/13/2015   GIB (gastrointestinal bleeding) 12/10/2015   Unstable angina 11/11/2015   Anxiety 06/14/2011   History of alcohol abuse 12/15/2010   History  of cocaine abuse 12/15/2010   Dysphagia 07/04/2010   PCP:  [employer] [employer] [facility] Pharmacy:   [facility]  [address] [place] [address] Phone: [phone] Fax: [phone]  [facility] [place] - [address] [place] [address] Phone: [phone] Fax: [phone]  [id number], [place] - [address] [place] [address] Phone: [phone] Fax: [phone]     Social Determinants of Health (SDOH) Social History: SDOH Screenings   Food  Insecurity: Food Insecurity Present (08/13/2022)  Housing: High Risk (08/13/2022)  Transportation Needs: No Transportation Needs (08/13/2022)  Utilities: At Risk (08/13/2022)  Tobacco Use: High Risk (08/13/2022)   SDOH Interventions:     Readmission Risk Interventions     No data to display

## 2022-08-15 NOTE — Discharge Instructions (Signed)
Intensive Outpatient Programs   [facility] [place] [address] [place],  [place], [place] (Inpatient and outpatient)[phone] (Suboxone and Methadone) [address] [phone]  ADS: [facility] Programs - Intensive Outpatient [place] [address] [place], [place], [place] (Outpatient, Inpatient, [facility] (Groups and Residental) (insurance only) [phone] [place], [place]   [facility] (for caregivers and family) [address] Kristeen Mans [address] [place], [place]  Residential Treatment Programs  [place] [facility](2 years) Residential: 63 days)[facility] ([place], [place], [place] or [phone]  D.R.E.A.M.S Treatment [facility] [place], [place], [place]  [place] (RTS) [place], [place], [place] Admissions: 8am-3pm M-F  BATS Program: Residential Program [phone] Days)             ADATC: [facility]  [place], [place], [place] or [phone] in Hours over the weekend or by referral)   [place]obil Crisis: [facility]:[phone] (for crisis response 24 hours a day)   Rent/Utility/Housing  Agency Name: [place] Address: [address] [place], [place] [address] Phone: [phone] Email:  [email or web address] Website: www.[email or web address].org Service(s) Offered: Housing services, self-sufficiency, congregate meal  program, weatherization program, Administrator, sports program, emergency food assistance,  housing counseling, home ownership program, wheels -towork program.  Agency Name: [place] Address: [address] [place], [place] [address] Phone: [phone] (8a-[phone] (8p- 10p) Email: [email or web address] Website: [email or web address] Service(s) Offered: A program for homeless and/or needy men that includes one-on-one counseling, life skills training and job rehabilitation.  Agency Name: [employer] [facility] [place] Address: [address] [place], [place] [address] Phone: [phone] Website: www.alliedchurches.org Service(s) Offered: Assistance to needy in emergency with utility bills, heating  fuel, and prescriptions. Shelter for homeless 7pm-7am. August 24, 2016 15  Agency Name: [facility] [place] (Developmentally Disabled) Address: [address], [place], [place] [address] Phone: [phone] Contact Person: Susanne Greenhouse Email: [email or web address] Website: [email or web address] Service(s) Offered: Helps individuals with developmental disabilities move  from housing that is more restrictive to homes where they  can achieve greater independence and have more  opportunities.  Agency Name: [employer] Address: [address], [place], [place] [address] Phone: [phone] Email: [email or web address] Website: www.burlingtonhousingauthority.org Service(s) Offered: Provides affordable housing for low-income families,  elderly, and disabled individuals. Offer a wide range of  programs and services, from financial planning to afterschool and summer programs.  Agency Name: [place] Address: [address] [place], [place] [address] Phone:  [phone] Service(s) Offered: Child support services; child welfare services; food stamps;  Medicaid; work first family assistance; and aid with fuel,  rent, food and medicine.  Agency Name: [facility] [place]. Address: [facility] [address], [place], [place]  [address] Phone: [phone] Website: www.familyabuseservices.org Service(s) Offered: 24 hour Crisis Line: [phone]; 24 hour Emergency Shelter;  Transitional Housing; Support Groups; Lexicographer;  [employer]; Hispanic Outreach: [phone];  [place]: [phone]. August 24, 2016 16  Agency Name: [place]. Address: [address], [place], [place] [address] Phone:  [phone] Service(s) Offered: CAP Services; Home and [employer]; Individual  or Group Supports; Respite Care Non-Institutional Nursing;  Residential Supports; Respite Care and Personal Care  Services; Transportation; Family and Friends Night;  Recreational Activities; Three Nutritious Meals/Snacks;  Consultation with Registered Dietician; Twenty-four hour  Registered Nurse Access; Daily and [employer]; [place]; [place] for the [employer] (During Summer Months) Bingo Night (Every  Wednesday Night); Special Populations Dance Night  (Every Tuesday Night); Professional Hair Care Services.  Agency Name: [facility] Address: [address], [place], [place] [address] Phone: [phone] Contact Person: Jabier Mutton Website: [email or web address].Physicist, medical) Offered: Residential treatment facility for women; food and  clothing, educational & employment development and  transportation to work; Counsellor of financial skills;  parenting and family reunification; emotional and spiritual  support; transitional housing for program graduates.  Agency Name: [facility] Address: [address] [place], [place] [address] Phone: [phone] Email: [email or web address] Website: TaskTown.es Service(s) Offered: Public housing units for elderly, disabled, and low income  people; housing choice vouchers for income eligible  applicants; shelter plus care vouchers; and [employer] program. August 24, 2016 17  Agency Name: [facility] Address: [address] [place], [place] [address] Phone: [phone] Email: [email or web address] Website: www.habitatalamance.org Service(s) Offered: Build houses for families in need of decent housing. Each  adult in the family must invest 200 hours of labor on  someone else's house, work with volunteers to build their  own house, attend classes on budgeting, home maintenance, yard care, and attend homeowner association  meetings.  Agency Name: [facility] Address: [address] [place], [place] [address] Phone: [phone] Website: www.rsli.org Service(s) Offered: Intermediate care facilities for mentally retarded,  Supervised Living in group homes for adults with  developmental disabilities, Supervised Living for people  who have dual diagnoses (MRMI), Independent Living,  Supported Living, respite and a variety of CAP services,  pre-vocational services, day supports, and [employer].  Agency Name: [place] [employer] Phone: [phone] Website: www.NCForeclosurePrevention.gov Service(s) Offered: Zero-interest, deferred loans to homeowners struggling to  pay their mortgage. Call for more information Transportation Agency Name: [facility] Address: [address] [place], [place] [address] Phone: [phone] Email: [email or web address] Website: www.alamanceservices.org Service(s) Offered: [employer], self-sufficiency, congregate meal  program, weatherization program, Field seismologist  program, emergency food assistance,  housing counseling, home ownership program, wheels-towork program. August 24, 2016 22  Agency Name: [facility] [employer] [phone]) Address: [address], [place], [place] [address] Phone: [phone] Email:  Website: www.[email or web address].[email or web address] Service(s) Offered: Transportation for general public, subscription and demand  response; Dial-a-Ride for citizens 73 years of age or older. Agency Name: Department of Social Services Address: [address] [place], [place] [address] Phone: [phone] Service(s) Offered: Child support services; child welfare services; food stamps;  Medicaid; work first family assistance; and aid with fuel,  rent, food and medicine, transportation assistance.  Agency Name: Disabled [employer] (DAV) Transportation  Network Address: Phone: [phone] Service(s) Offered: Transports veterans to the [facility] medical center. Call  forty-eight hours in advance and leave the name, telephone  number, date, and time of appointment. Veteran will be  contacted by the driver the day before the appointment to  arrange a pick up point  Food Resources  Agency Name: [facility] Address: [address], [place], [place] [address] Phone: [phone] Website: www.alamanceservices.org  Service(s) Offered:  Housing services, self-sufficiency, congregate meal  program, weatherization program, heating appliance  repair/replacement program, emergency food assistance,  housing counseling, home ownership program, wheels -towork program. Meals free for 60 and older at various  locations from 9am-1pm, Monday-Friday:  [facility], [address] [place], [phone] [facility], [address], [place] [phone]   [facility], [address],   [place] [phone] The [address], [address],   [place], [phone]  Agency Name:  [facility] Address: [phone] [address], [place], [place] [address] Phone: [phone] Website: www.alamancemow.org Service(s) Offered: Home delivered hot, frozen, and emergency  meals. Grocery assistance program which matches  volunteers one-on-one with seniors unable to grocery shop  for themselves. Must be 60 years and older; less than 20  hours of in-home aide service, limited or no driving ability;  live alone or with someone with a disability; live in  [place].  Agency Name: [facility]) Address: [address], [place], [place] [address] Phone: [phone] Service(s) Offered: Food is served to shut-ins, homeless, elderly, and low  income people in the community every Saturday (11:30  am-12:30 pm) and Sunday (12:30 pm-1:30pm). Volunteers  also offer help and encouragement in seeking employment,  and spiritual guidance. August 24, 2016 8  Agency Name: Department of Social Services Address: [address] [place], [place] [address] Phone: [phone] Service(s) Offered: Child support services; child welfare services; food stamps;  Medicaid; work first family assistance; and aid with fuel,  rent, food and medicine.  Agency Name: Dietitian Address: [address] [place], [place] Phone: [phone] Website: www.dreamalign.com Services Offered: Monday 10:00am-12:00, 8:00pm-9:00pm, and Friday  10:00am-12:00. Agency Name: [employer] [facility] [place] Address: [address], [place], [place] [address] Phone: [phone] Website: www.alliedchurches.org Service(s) Offered: Serves weekday meals, open from 11:30 am- 1:00 pm., and  6:30-7:30pm, Monday-Wednesday-Friday distributes food  3:30-6pm, Monday-Wednesday-Friday.  Agency Name: [facility] Address: [address] [place], [place] Phone: [phone] Website: www.[email or web address].[email or web address] Services  Offered: Distributes food the 4th Saturday of the month, starting at  8:00 am Agency Name: [facility] Address: [phone] [address] [place], [place] [address] Phone: [phone] Website: [email or web address] Service(s) Offered: Bread of life, weekly food pantry. Open Wednesdays from  10:00am-noon.  Agency Name: [facility] [employer] Address: [address] [place], [place], [place] Phone: [phone] Services Offered: Distributes food 9am-1pm, Monday-Thursday. Call for details.   Agency Name: [facility] Address: [address], [place], [place] [address] Phone: [phone] Website: firstbaptistburlington.com Service(s) Offered: Games developer. Call for assistance. Agency Name: [facility] Address: [address] [place], [place] [address] Phone: [phone] Service Offered: Emergency Food Pantry. Call for appointment.  Agency Name: [facility] Address: [address], [place], [place] [address] Phone: [phone] Website: [email or web address] Services Offered: Games developer. Call for details Agency Name: [facility] Address: [address] [place], [place] Phone: [phone] Website: [email or web address] Service(s) Offered: Emergency Food Pantry. Call for details.  Agency Name: Holiday representative Address: [address], [place], [place] [address] Phone: [phone] or [phone] Website: [email or web address].[email or web address] Service(s) Offered: Distribute food 9am-11:30 am, Tuesday-Friday, and 1- 3:30pm, Monday-Friday. Food pantry Monday-Friday  1pm-3pm, fresh items, Mon.-Wed.-Fri.  Agency Name: [facility] ([facility]) Address: [address] [place], [place] [address] Phone: [phone] Website: www.safealamance.org Services Offered: Distribute food Tues and Sats from 9:00am-noon. Closed  1st Saturday of  each month. Call for details

## 2022-08-16 NOTE — Discharge Summary (Signed)
Physician Discharge Summary   Patient: Meghan Jimenez MRN: [id number] DOB: 29-May-1960  Admit date:     08/13/2022  Discharge date: 08/15/2022  Discharge Physician: Delfino Lovett   PCP: [facility]   Recommendations at discharge:    F/up with outpt providers as requested  Discharge Diagnoses: Principal Problem:   COPD exacerbation Active Problems:   Substance abuse   Essential hypertension   Cirrhosis of liver   Diabetes mellitus, type 2   Chronic diastolic CHF (congestive heart failure)   Acute on chronic respiratory failure with hypoxia  Hospital Course: 62 y.o. 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  4/15: Check UDS, PT and OT eval.  Patient complaining of indigestion    Assessment and Plan: * COPD exacerbation Patient is presenting with increased shortness of breath and productive cough over the last 2 days most consistent with a COPD exacerbation in the setting of continued tobacco use and inhaled cocaine use. No peripheral edema or evidence of hypervolemia to suggest acute heart failure.  Possible atelectasis versus pneumonia seen on x-ray, however patient has a history of chronic right middle lobe lung collapse  - She is on room air now - CT chest without contrast shows Mucous plugging and atelectasis in the right lower lobe with superimposed pneumonia. Patchy ground-glass densities in the posterior right middle lobe are likely infectious or inflammatory. - Improved with Steroids, Nebs, inhaler and Abx.  Her procalcitonin is normal, so unlikely bacterial  Chronic diastolic CHF (congestive heart failure) Per chart review, patient has a history of heart failure. Last echocardiogram in October 2023 with a EF of 70-75% with  moderate left hypertrophy.  She appears euvolemic at this time. -Continue home diuretics.  Diabetes mellitus, type 2 Currently diet controlled only. Hemoglobin A1c 5.3  Cirrhosis of liver Per chart review, patient has a history of liver cirrhosis with no evidence of prior decompensation.   Essential hypertension Continue Coreg, amlodipine  Substance abuse Patient states that she continues to use cocaine via inhalation weekly due to chronic pain.  She also continues to smoke approximately half a pack of tobacco per day.  She denies any recent alcohol use in several years though after being diagnosed with cirrhosis.  - TOC consultation for substance abuse resources. UDS + for Cocaine          Disposition: Home Diet recommendation:  Discharge Diet Orders (From admission, onward)     Start     Ordered   08/15/22 0000  Diet - low sodium heart healthy        08/15/22 1004           Carb modified diet DISCHARGE MEDICATION: Allergies as of 08/15/2022       Reactions   Aspirin Anaphylaxis, Palpitations   Patient takes ibuprofen as home med, so no problem with NSAIDs   Tramadol Anaphylaxis   Acetaminophen    Patient reports she is supposed to limit intake due to liver issues   Latex    Codeine Rash   Vicodin [hydrocodone-acetaminophen] Palpitations        Medication List     TAKE these medications    albuterol 108 (90 Base) MCG/ACT inhaler Commonly known as: VENTOLIN HFA Inhale 1-2 puffs into the lungs every 4 (four) hours as needed for wheezing  or shortness of breath.   amLODipine 10 MG tablet Commonly known as: NORVASC Take 10 mg by mouth daily.   carvedilol 3.125 MG tablet Commonly known as: COREG Take 3.125 mg by mouth 2 (two) times daily.   diphenhydrAMINE 50 MG capsule Commonly known as: BENADRYL Take 50 mg by mouth every 6 (six) hours as needed.   furosemide 20 MG tablet Commonly known as: LASIX Take 20 mg by mouth daily as needed for edema or  fluid.   hydrOXYzine 25 MG tablet Commonly known as: ATARAX Take 25 mg by mouth 3 (three) times daily.   Iron 325 (65 Fe) MG Tabs Take 1 tablet by mouth once a week.   Nitrostat 0.4 MG SL tablet Generic drug: nitroGLYCERIN Place 0.4 mg under the tongue every 5 (five) minutes as needed for chest pain.   pantoprazole 40 MG tablet Commonly known as: PROTONIX Take 40 mg by mouth 2 (two) times daily before a meal.   pregabalin 300 MG capsule Commonly known as: LYRICA Take 300 mg by mouth 2 (two) times daily.   Spiriva HandiHaler 18 MCG inhalation capsule Generic drug: tiotropium Place 1 capsule into inhaler and inhale daily.   spironolactone 50 MG tablet Commonly known as: ALDACTONE Take 50 mg by mouth daily as needed.   venlafaxine XR 150 MG 24 hr capsule Commonly known as: EFFEXOR-XR Take 150 mg by mouth daily.        Follow-up [employer] [employer] [facility] Follow up on 08/21/2022.   Why: [facility] Discharge F/UP. Go at 2:00pm. Contact information: [address] [place] [address] [phone]                Discharge Exam: There were no vitals filed for this visit. 62 year old female lying in the bed comfortably without any acute distress Lungs clear to auscultation bilaterally Heart regular rate and rhythm Abdomen soft, benign Neuro alert and awake, nonfocal Skin no rash or lesion Psych normal mood and affect  Condition at discharge: fair  The results of significant diagnostics from this hospitalization (including imaging, microbiology, ancillary and laboratory) are listed below for reference.   Imaging Studies: CT CHEST WO CONTRAST  Result Date: 08/13/2022 CLINICAL DATA:  Chest pain and shortness of breath for the past 2 days. EXAM: CT CHEST WITHOUT CONTRAST TECHNIQUE: Multidetector CT imaging of the chest was performed following the standard protocol without IV contrast. RADIATION DOSE REDUCTION: This exam was performed  according to the departmental dose-optimization program which includes automated exposure control, adjustment of the mA and/or kV according to patient size and/or use of iterative reconstruction technique. COMPARISON:  Chest x-ray from same day. CT chest dated February 23, 2022. FINDINGS: Cardiovascular: Normal heart size. Prior CABG. No thoracic aortic aneurysm. Coronary, aortic arch, and branch vessel atherosclerotic vascular disease. Mediastinum/Nodes: New prominent mediastinal lymph nodes measuring up to 9 mm in short axis, likely reactive. No enlarged axillary lymph nodes. Thyroid gland, trachea, and esophagus demonstrate no significant findings. Lungs/Pleura: Mucous plugging and atelectasis in the right lower lobe with superimposed patchy consolidation. Additional patchy ground-glass densities in the posterior right middle lobe. Subsegmental atelectasis in the left lower lobe. No pleural effusion or pneumothorax. No suspicious pulmonary nodule. Upper Abdomen: No acute abnormality. Unchanged cirrhotic liver with mild splenomegaly. Musculoskeletal: Chronic sternal defect again noted. Similar fat containing subxiphoid hernia. No acute or significant osseous findings. IMPRESSION: 1. Mucous plugging and atelectasis in the right lower lobe with superimposed pneumonia. Patchy ground-glass densities  in the posterior right middle lobe are likely infectious or inflammatory. 2. Unchanged cirrhosis with mild splenomegaly. 3.  Aortic atherosclerosis (ICD10-I70.0). Electronically Signed   By: Obie Dredge M.D.   On: 08/13/2022 15:45   DG Chest 2 View  Result Date: 08/13/2022 CLINICAL DATA:  Shortness of breath, chest pain. EXAM: CHEST - 2 VIEW COMPARISON:  February 23, 2022. FINDINGS: The heart size and mediastinal contours are within normal limits. Left lung is clear. Minimal right basilar subsegmental atelectasis or infiltrate is noted. There also appears to be moderate to large ventral hernia anterior to the lower  chest containing loop of bowel. The visualized skeletal structures are unremarkable. IMPRESSION: Minimal right basilar subsegmental atelectasis or infiltrate. Moderate to large ventral hernia is noted anterior to lower chest containing loop of bowel. Electronically Signed   By: Lupita Raider M.D.   On: 08/13/2022 12:42    Microbiology: Results for orders placed or performed during the hospital encounter of 08/13/22  SARS Coronavirus 2 by RT PCR (hospital order, performed in [facility] hospital lab) *cepheid single result test* Anterior Nasal Swab     Status: None   Collection Time: 08/13/22 12:37 PM   Specimen: Anterior Nasal Swab  Result Value Ref Range Status   SARS Coronavirus 2 by RT PCR NEGATIVE NEGATIVE Final    Comment: (NOTE) SARS-CoV-2 target nucleic acids are NOT DETECTED.  The SARS-CoV-2 RNA is generally detectable in upper and lower respiratory specimens during the acute phase of infection. The lowest concentration of SARS-CoV-2 viral copies this assay can detect is 250 copies / mL. A negative result does not preclude SARS-CoV-2 infection and should not be used as the sole basis for treatment or other patient management decisions.  A negative result may occur with improper specimen collection / handling, submission of specimen other than nasopharyngeal swab, presence of viral mutation(s) within the areas targeted by this assay, and inadequate number of viral copies (<250 copies / mL). A negative result must be combined with clinical observations, patient history, and epidemiological information.  Fact Sheet for Patients:   RoadLapTop.co.za  Fact Sheet for Healthcare Providers: http://kim-miller.com/  This test is not yet approved or  cleared by the [place] FDA and has been authorized for detection and/or diagnosis of SARS-CoV-2 by FDA under an Emergency Use Authorization (EUA).  This EUA will remain in effect (meaning  this test can be used) for the duration of the COVID-19 declaration under Section 564(b)(1) of the Act, 21 U.S.C. section 360bbb-3(b)(1), unless the authorization is terminated or revoked sooner.  Performed at [facility], [address] [place], [place] [address]   Blood culture (single)     Status: None (Preliminary result)   Collection Time: 08/13/22  1:01 PM   Specimen: BLOOD  Result Value Ref Range Status   Specimen Description BLOOD LEFT ARM  Final   Special Requests   Final    BOTTLES DRAWN AEROBIC AND ANAEROBIC Blood Culture results may not be optimal due to an excessive volume of blood received in culture bottles   Culture   Final    NO GROWTH 3 DAYS Performed at [facility], [address] [place], [place] [address]    Report Status PENDING  Incomplete  Respiratory (~20 pathogens) panel by PCR     Status: None   Collection Time: 08/13/22  3:08 PM   Specimen: Nasopharyngeal Swab; Respiratory  Result Value Ref Range Status   Adenovirus NOT DETECTED NOT DETECTED Final  Coronavirus 229E NOT DETECTED NOT DETECTED Final    Comment: (NOTE) The Coronavirus on the Respiratory Panel, DOES NOT test for the novel  Coronavirus (2019 nCoV)    Coronavirus HKU1 NOT DETECTED NOT DETECTED Final   Coronavirus NL63 NOT DETECTED NOT DETECTED Final   Coronavirus OC43 NOT DETECTED NOT DETECTED Final   Metapneumovirus NOT DETECTED NOT DETECTED Final   Rhinovirus / Enterovirus NOT DETECTED NOT DETECTED Final   Influenza A NOT DETECTED NOT DETECTED Final   Influenza B NOT DETECTED NOT DETECTED Final   Parainfluenza Virus 1 NOT DETECTED NOT DETECTED Final   Parainfluenza Virus 2 NOT DETECTED NOT DETECTED Final   Parainfluenza Virus 3 NOT DETECTED NOT DETECTED Final   Parainfluenza Virus 4 NOT DETECTED NOT DETECTED Final   Respiratory Syncytial Virus NOT DETECTED NOT DETECTED Final   Bordetella pertussis NOT DETECTED NOT DETECTED Final   Bordetella Parapertussis NOT  DETECTED NOT DETECTED Final   Chlamydophila pneumoniae NOT DETECTED NOT DETECTED Final   Mycoplasma pneumoniae NOT DETECTED NOT DETECTED Final    Comment: Performed at [facility], [address], [place], [place] [address]    Labs: CBC: Recent Labs  Lab 08/13/22 1237 08/14/22 0459 08/15/22 0439  WBC 7.2 7.0 8.5  NEUTROABS  --  6.0  --   HGB 12.6 11.1* 10.9*  HCT 40.8 35.5* 35.1*  MCV 66.6* 66.4* 67.1*  PLT 135* 98* 124*   Basic Metabolic Panel: Recent Labs  Lab 08/13/22 1237 08/14/22 0459 08/15/22 0439  NA 137 132* 138  K 3.8 3.9 3.9  CL 104 100 104  CO2 21* 23 28  GLUCOSE 91 130* 121*  BUN 10 21 19   CREATININE 0.59 0.60 0.81  CALCIUM 9.3 9.0 8.9  MG  --  2.2  --    Liver Function Tests: Recent Labs  Lab 08/13/22 1403 08/14/22 0459  AST 45* 33  ALT 22 21  ALKPHOS 112 115  BILITOT 1.2 0.8  PROT 7.9 7.7  ALBUMIN 3.3* 3.4*   CBG: Recent Labs  Lab 08/14/22 0749 08/14/22 1143 08/14/22 1648 08/14/22 2106 08/15/22 0842  GLUCAP 130* 135* 142* 109* 101*    Discharge time spent: greater than 30 minutes.  Signed: Delfino Lovett, MD [facility] 08/16/2022

## 2022-08-18 LAB — CULTURE, BLOOD (SINGLE): Culture: NO GROWTH

## 2022-10-03 ENCOUNTER — Emergency Department: Payer: Medicare HMO

## 2022-10-03 ENCOUNTER — Other Ambulatory Visit: Payer: Self-pay

## 2022-10-03 ENCOUNTER — Inpatient Hospital Stay
Admission: EM | Admit: 2022-10-03 | Discharge: 2022-10-06 | DRG: 178 | Disposition: A | Discharge status: Home / Self Care | Payer: Medicare HMO | Attending: Internal Medicine | Admitting: Internal Medicine

## 2022-10-03 DIAGNOSIS — J690 Pneumonitis due to inhalation of food and vomit: Principal | ICD-10-CM | POA: Diagnosis present

## 2022-10-03 DIAGNOSIS — Z833 Family history of diabetes mellitus: Secondary | ICD-10-CM

## 2022-10-03 DIAGNOSIS — Z886 Allergy status to analgesic agent status: Secondary | ICD-10-CM

## 2022-10-03 DIAGNOSIS — D509 Iron deficiency anemia, unspecified: Secondary | ICD-10-CM | POA: Diagnosis present

## 2022-10-03 DIAGNOSIS — B1920 Unspecified viral hepatitis C without hepatic coma: Secondary | ICD-10-CM | POA: Diagnosis present

## 2022-10-03 DIAGNOSIS — Z885 Allergy status to narcotic agent status: Secondary | ICD-10-CM

## 2022-10-03 DIAGNOSIS — R1314 Dysphagia, pharyngoesophageal phase: Secondary | ICD-10-CM | POA: Diagnosis present

## 2022-10-03 DIAGNOSIS — I110 Hypertensive heart disease with heart failure: Secondary | ICD-10-CM | POA: Diagnosis present

## 2022-10-03 DIAGNOSIS — Z8249 Family history of ischemic heart disease and other diseases of the circulatory system: Secondary | ICD-10-CM

## 2022-10-03 DIAGNOSIS — Z66 Do not resuscitate: Secondary | ICD-10-CM | POA: Diagnosis present

## 2022-10-03 DIAGNOSIS — G8929 Other chronic pain: Secondary | ICD-10-CM | POA: Diagnosis present

## 2022-10-03 DIAGNOSIS — I4510 Unspecified right bundle-branch block: Secondary | ICD-10-CM | POA: Diagnosis present

## 2022-10-03 DIAGNOSIS — I5032 Chronic diastolic (congestive) heart failure: Secondary | ICD-10-CM | POA: Diagnosis present

## 2022-10-03 DIAGNOSIS — I252 Old myocardial infarction: Secondary | ICD-10-CM

## 2022-10-03 DIAGNOSIS — F17210 Nicotine dependence, cigarettes, uncomplicated: Secondary | ICD-10-CM | POA: Diagnosis present

## 2022-10-03 DIAGNOSIS — E119 Type 2 diabetes mellitus without complications: Secondary | ICD-10-CM | POA: Diagnosis present

## 2022-10-03 DIAGNOSIS — R0602 Shortness of breath: Secondary | ICD-10-CM | POA: Diagnosis not present

## 2022-10-03 DIAGNOSIS — J189 Pneumonia, unspecified organism: Secondary | ICD-10-CM | POA: Diagnosis not present

## 2022-10-03 DIAGNOSIS — K439 Ventral hernia without obstruction or gangrene: Secondary | ICD-10-CM | POA: Insufficient documentation

## 2022-10-03 DIAGNOSIS — F1411 Cocaine abuse, in remission: Secondary | ICD-10-CM | POA: Diagnosis present

## 2022-10-03 DIAGNOSIS — F1410 Cocaine abuse, uncomplicated: Secondary | ICD-10-CM | POA: Diagnosis present

## 2022-10-03 DIAGNOSIS — Z888 Allergy status to other drugs, medicaments and biological substances status: Secondary | ICD-10-CM

## 2022-10-03 DIAGNOSIS — Z1152 Encounter for screening for COVID-19: Secondary | ICD-10-CM

## 2022-10-03 DIAGNOSIS — Z801 Family history of malignant neoplasm of trachea, bronchus and lung: Secondary | ICD-10-CM

## 2022-10-03 DIAGNOSIS — Z91040 Latex allergy status: Secondary | ICD-10-CM

## 2022-10-03 DIAGNOSIS — Z91148 Patient's other noncompliance with medication regimen for other reason: Secondary | ICD-10-CM

## 2022-10-03 DIAGNOSIS — J441 Chronic obstructive pulmonary disease with (acute) exacerbation: Secondary | ICD-10-CM | POA: Diagnosis present

## 2022-10-03 DIAGNOSIS — Z79899 Other long term (current) drug therapy: Secondary | ICD-10-CM

## 2022-10-03 DIAGNOSIS — K7460 Unspecified cirrhosis of liver: Secondary | ICD-10-CM | POA: Diagnosis present

## 2022-10-03 DIAGNOSIS — I2510 Atherosclerotic heart disease of native coronary artery without angina pectoris: Secondary | ICD-10-CM | POA: Diagnosis present

## 2022-10-03 DIAGNOSIS — K224 Dyskinesia of esophagus: Secondary | ICD-10-CM | POA: Diagnosis present

## 2022-10-03 DIAGNOSIS — R531 Weakness: Secondary | ICD-10-CM

## 2022-10-03 LAB — URINE DRUG SCREEN, QUALITATIVE (ARMC ONLY)
Amphetamines, Ur Screen: NOT DETECTED
Barbiturates, Ur Screen: NOT DETECTED
Benzodiazepine, Ur Scrn: NOT DETECTED
Cannabinoid 50 Ng, Ur ~~LOC~~: NOT DETECTED
Cocaine Metabolite,Ur ~~LOC~~: POSITIVE — AB
MDMA (Ecstasy)Ur Screen: NOT DETECTED
Methadone Scn, Ur: NOT DETECTED
Opiate, Ur Screen: NOT DETECTED
Phencyclidine (PCP) Ur S: NOT DETECTED
Tricyclic, Ur Screen: NOT DETECTED

## 2022-10-03 LAB — BASIC METABOLIC PANEL
Anion gap: 9 (ref 5–15)
BUN: 11 mg/dL (ref 8–23)
CO2: 24 mmol/L (ref 22–32)
Calcium: 9.2 mg/dL (ref 8.9–10.3)
Chloride: 101 mmol/L (ref 98–111)
Creatinine, Ser: 0.62 mg/dL (ref 0.44–1.00)
GFR, Estimated: >60 mL/min (ref >60)
Glucose, Bld: 120 mg/dL — ABNORMAL HIGH (ref 70–99)
Potassium: 3.1 mmol/L — ABNORMAL LOW (ref 3.5–5.1)
Sodium: 134 mmol/L — ABNORMAL LOW (ref 135–145)

## 2022-10-03 LAB — HEPATIC FUNCTION PANEL
ALT: 19 U/L (ref 0–44)
AST: 24 U/L (ref 15–41)
Albumin: 3.6 g/dL (ref 3.5–5.0)
Alkaline Phosphatase: 86 U/L (ref 38–126)
Bilirubin, Direct: 0.2 mg/dL (ref 0.0–0.2)
Indirect Bilirubin: 0.6 mg/dL (ref 0.3–0.9)
Total Bilirubin: 0.8 mg/dL (ref 0.3–1.2)
Total Protein: 7.1 g/dL (ref 6.5–8.1)

## 2022-10-03 LAB — TROPONIN I (HIGH SENSITIVITY)
Troponin I (High Sensitivity): 6 ng/L (ref <18)
Troponin I (High Sensitivity): 6 ng/L (ref <18)

## 2022-10-03 LAB — CBC
HCT: 34.7 % — ABNORMAL LOW (ref 36.0–46.0)
HCT: 32.9 % — ABNORMAL LOW (ref 36.0–46.0)
Hemoglobin: 10.9 g/dL — ABNORMAL LOW (ref 12.0–15.0)
Hemoglobin: 10.5 g/dL — ABNORMAL LOW (ref 12.0–15.0)
MCH: 21.3 pg — ABNORMAL LOW (ref 26.0–34.0)
MCH: 21.4 pg — ABNORMAL LOW (ref 26.0–34.0)
MCHC: 31.4 g/dL (ref 30.0–36.0)
MCHC: 31.9 g/dL (ref 30.0–36.0)
MCV: 67.9 fL — ABNORMAL LOW (ref 80.0–100.0)
MCV: 67.1 fL — ABNORMAL LOW (ref 80.0–100.0)
Platelets: 150 10*3/uL (ref 150–400)
Platelets: 148 10*3/uL — ABNORMAL LOW (ref 150–400)
RBC: 5.11 MIL/uL (ref 3.87–5.11)
RBC: 4.9 MIL/uL (ref 3.87–5.11)
RDW: 19.9 % — ABNORMAL HIGH (ref 11.5–15.5)
RDW: 19.9 % — ABNORMAL HIGH (ref 11.5–15.5)
WBC: 7.8 10*3/uL (ref 4.0–10.5)
WBC: 6.7 10*3/uL (ref 4.0–10.5)
nRBC: 0 % (ref 0.0–0.2)
nRBC: 0 % (ref 0.0–0.2)

## 2022-10-03 LAB — CREATININE, SERUM
Creatinine, Ser: 0.54 mg/dL (ref 0.44–1.00)
GFR, Estimated: >60 mL/min (ref >60)

## 2022-10-03 LAB — SARS CORONAVIRUS 2 BY RT PCR: SARS Coronavirus 2 by RT PCR: NEGATIVE

## 2022-10-03 LAB — BRAIN NATRIURETIC PEPTIDE: B Natriuretic Peptide: 69.1 pg/mL (ref 0.0–100.0)

## 2022-10-03 MED ORDER — SODIUM CHLORIDE 0.9 % IV SOLN
2.0000 g | Freq: Once | INTRAVENOUS | Status: AC
  Administered 2022-10-03: 2 g via INTRAVENOUS
  Filled 2022-10-03: qty 20

## 2022-10-03 MED ORDER — SODIUM CHLORIDE 0.9 % IV SOLN
500.0000 mg | Freq: Once | INTRAVENOUS | Status: AC
  Administered 2022-10-03: 500 mg via INTRAVENOUS
  Filled 2022-10-03: qty 5

## 2022-10-03 MED ORDER — IOHEXOL 350 MG/ML SOLN
100.0000 mL | Freq: Once | INTRAVENOUS | Status: AC | PRN
  Administered 2022-10-03: 100 mL via INTRAVENOUS

## 2022-10-03 MED ORDER — PANTOPRAZOLE SODIUM 40 MG PO TBEC
40.0000 mg | DELAYED_RELEASE_TABLET | Freq: Two times a day (BID) | ORAL | Status: DC
  Administered 2022-10-04 – 2022-10-06 (×5): 40 mg via ORAL
  Filled 2022-10-03 (×5): qty 1

## 2022-10-03 MED ORDER — OXYCODONE HCL 5 MG PO TABS
5.0000 mg | ORAL_TABLET | Freq: Once | ORAL | Status: AC
  Administered 2022-10-03: 5 mg via ORAL
  Filled 2022-10-03: qty 1

## 2022-10-03 MED ORDER — SODIUM CHLORIDE 0.9 % IV BOLUS
1000.0000 mL | Freq: Once | INTRAVENOUS | Status: AC
  Administered 2022-10-03: 1000 mL via INTRAVENOUS

## 2022-10-03 MED ORDER — AMLODIPINE BESYLATE 5 MG PO TABS: 10.0000 mg | ORAL_TABLET | Freq: Every day | ORAL | Status: DC

## 2022-10-03 MED ORDER — METHYLPREDNISOLONE SODIUM SUCC 125 MG IJ SOLR
125.0000 mg | Freq: Once | INTRAMUSCULAR | Status: AC
  Administered 2022-10-03: 125 mg via INTRAVENOUS
  Filled 2022-10-03: qty 2

## 2022-10-03 MED ORDER — IPRATROPIUM-ALBUTEROL 0.5-2.5 (3) MG/3ML IN SOLN
3.0000 mL | Freq: Once | RESPIRATORY_TRACT | Status: AC
  Administered 2022-10-03: 3 mL via RESPIRATORY_TRACT
  Filled 2022-10-03: qty 3

## 2022-10-03 MED ORDER — SODIUM CHLORIDE 0.9 % IV SOLN
500.0000 mg | INTRAVENOUS | Status: AC
  Administered 2022-10-04 – 2022-10-05 (×2): 500 mg via INTRAVENOUS
  Filled 2022-10-03 (×2): qty 5

## 2022-10-03 MED ORDER — NITROGLYCERIN 0.4 MG SL SUBL: 0.4000 mg | SUBLINGUAL_TABLET | SUBLINGUAL | Status: DC | PRN

## 2022-10-03 MED ORDER — ALBUTEROL SULFATE (2.5 MG/3ML) 0.083% IN NEBU
2.5000 mg | INHALATION_SOLUTION | RESPIRATORY_TRACT | Status: AC
  Administered 2022-10-04 (×4): 2.5 mg via RESPIRATORY_TRACT
  Filled 2022-10-03 (×5): qty 3

## 2022-10-03 MED ORDER — ENOXAPARIN SODIUM 40 MG/0.4ML IJ SOSY
40.0000 mg | PREFILLED_SYRINGE | INTRAMUSCULAR | Status: DC
  Administered 2022-10-04 – 2022-10-05 (×2): 40 mg via SUBCUTANEOUS
  Filled 2022-10-03 (×2): qty 0.4

## 2022-10-03 MED ORDER — ALBUTEROL SULFATE (2.5 MG/3ML) 0.083% IN NEBU
2.5000 mg | INHALATION_SOLUTION | RESPIRATORY_TRACT | Status: DC | PRN
  Administered 2022-10-04 – 2022-10-05 (×2): 2.5 mg via RESPIRATORY_TRACT
  Filled 2022-10-03 (×2): qty 3

## 2022-10-03 MED ORDER — TIOTROPIUM BROMIDE MONOHYDRATE 18 MCG IN CAPS
1.0000 | ORAL_CAPSULE | Freq: Every day | RESPIRATORY_TRACT | Status: DC
  Administered 2022-10-04 – 2022-10-06 (×2): 18 ug via RESPIRATORY_TRACT
  Filled 2022-10-03 (×2): qty 5

## 2022-10-03 MED ORDER — HYDROXYZINE HCL 25 MG PO TABS
25.0000 mg | ORAL_TABLET | Freq: Three times a day (TID) | ORAL | Status: DC
  Administered 2022-10-03 – 2022-10-06 (×8): 25 mg via ORAL
  Filled 2022-10-03 (×9): qty 1

## 2022-10-03 MED ORDER — SODIUM CHLORIDE 0.9 % IV SOLN
1.0000 g | INTRAVENOUS | Status: DC
  Administered 2022-10-04 – 2022-10-05 (×2): 1 g via INTRAVENOUS
  Filled 2022-10-03 (×3): qty 10

## 2022-10-03 MED ORDER — FLUTICASONE PROPIONATE HFA 44 MCG/ACT IN AERO: 2.0000 | INHALATION_SPRAY | Freq: Two times a day (BID) | RESPIRATORY_TRACT | Status: DC

## 2022-10-03 MED ORDER — BUDESONIDE 0.25 MG/2ML IN SUSP
0.2500 mg | Freq: Two times a day (BID) | RESPIRATORY_TRACT | Status: DC
  Administered 2022-10-04 – 2022-10-06 (×6): 0.25 mg via RESPIRATORY_TRACT
  Filled 2022-10-03 (×6): qty 2

## 2022-10-03 MED ORDER — FUROSEMIDE 20 MG PO TABS: 20.0000 mg | ORAL_TABLET | Freq: Every day | ORAL | Status: DC | PRN

## 2022-10-03 MED ORDER — SODIUM CHLORIDE 0.9% FLUSH
3.0000 mL | Freq: Two times a day (BID) | INTRAVENOUS | Status: DC
  Administered 2022-10-03 – 2022-10-06 (×5): 3 mL via INTRAVENOUS

## 2022-10-03 MED ORDER — VENLAFAXINE HCL ER 150 MG PO CP24
150.0000 mg | ORAL_CAPSULE | Freq: Every day | ORAL | Status: DC
  Administered 2022-10-04 – 2022-10-06 (×3): 150 mg via ORAL
  Filled 2022-10-03 (×3): qty 1

## 2022-10-03 MED ORDER — PREGABALIN 75 MG PO CAPS
300.0000 mg | ORAL_CAPSULE | Freq: Two times a day (BID) | ORAL | Status: DC
  Administered 2022-10-03 – 2022-10-06 (×6): 300 mg via ORAL
  Filled 2022-10-03 (×3): qty 4
  Filled 2022-10-03 (×2): qty 6
  Filled 2022-10-03: qty 4

## 2022-10-03 NOTE — Assessment & Plan Note (Signed)
Will need routine surgery input from [facility] cardiothoracic surgeon. In close proximity to sternotomy

## 2022-10-03 NOTE — ED Notes (Signed)
Placed pt on bed pan.

## 2022-10-03 NOTE — ED Provider Notes (Signed)
[facility] Provider Note    Event Date/Time   First MD Initiated Contact with Patient 10/03/22 1829     (approximate)   History   Chest Pain and Shortness of Breath   HPI  Meghan Jimenez is a 62 y.o. female  with h/o CAD, CHF, COPD, cirrhosis, here with shortness of breath. Pt was living with significant other until last night, left and stayed at her son's house. Started using cocaine and since then has chest pain and entire body hurts. Chest pain started last night, radiated to her back, pressure like but also sharp and stabbing. EMS put pt on oxygen unclear if she is suppose to be on this at home. H/o chronic substance abuse, alcohol. Used crack yesterday morning.     Physical Exam   Triage Vital Signs: ED Triage Vitals  Enc Vitals Group     BP 10/03/22 1835 (!) 142/117     Pulse Rate 10/03/22 1835 95     Resp 10/03/22 1835 20     Temp 10/03/22 1835 98.5 F (36.9 C)     Temp Source 10/03/22 1835 Oral     SpO2 10/03/22 1835 97 %     Weight 10/03/22 1834 175 lb (79.4 kg)     Height 10/03/22 1834 5\' 3"  (1.6 m)     Head Circumference --      Peak Flow --      Pain Score 10/03/22 1833 10     Pain Loc --      Pain Edu? --      Excl. in GC? --     Most recent vital signs: Vitals:   10/03/22 2300 10/04/22 0000  BP: 136/70 (!) 111/54  Pulse: 80 89  Resp: (!) 22 15  Temp:    SpO2: 92% 91%     General: Awake, no distress.  CV:  Good peripheral perfusion. Regular rate and rhythm. Resp:  Mild tachypnea noted. Diminished breath sounds. Abd:  No distention. No tenderness. Other:  Scar tissue over sternum with indentation of tissue 2/2 surgical history. No overlying skin changes.   ED Results / Procedures / Treatments   Labs (all labs ordered are listed, but only abnormal results are displayed) Labs Reviewed  BASIC METABOLIC PANEL - Abnormal; Notable for the following components:      Result Value   Sodium 134 (*)    Potassium 3.1 (*)     Glucose, Bld 120 (*)    All other components within normal limits  CBC - Abnormal; Notable for the following components:   Hemoglobin 10.9 (*)    HCT 34.7 (*)    MCV 67.9 (*)    MCH 21.3 (*)    RDW 19.9 (*)    All other components within normal limits  URINE DRUG SCREEN, QUALITATIVE ([facility] ONLY) - Abnormal; Notable for the following components:   Cocaine Metabolite,[place] POSITIVE (*)    All other components within normal limits  CBC - Abnormal; Notable for the following components:   Hemoglobin 10.5 (*)    HCT 32.9 (*)    MCV 67.1 (*)    MCH 21.4 (*)    RDW 19.9 (*)    Platelets 148 (*)    All other components within normal limits  SARS CORONAVIRUS 2 BY RT PCR  CULTURE, BLOOD (ROUTINE X 2)  CULTURE, BLOOD (ROUTINE X 2)  ACID FAST SMEAR (AFB, MYCOBACTERIA)  ACID FAST CULTURE WITH REFLEXED SENSITIVITIES (MYCOBACTERIA)  CULTURE, FUNGUS WITHOUT SMEAR  BRAIN NATRIURETIC PEPTIDE  HEPATIC FUNCTION PANEL  CREATININE, SERUM  PROTIME-INR  APTT  BASIC METABOLIC PANEL  CBC  TROPONIN I (HIGH SENSITIVITY)  TROPONIN I (HIGH SENSITIVITY)     EKG Normal sinus rhythm, ventricular rate 85.  PR 167, QRS 143, QTc 457.  No acute ST elevations or depressions.   RADIOLOGY Chest x-ray: No active disease CT dissection: No evidence of aneurysm or dissection.  Infection or aspiration noted.  Diffuse bronchial wall thickening.  Coronary disease.   I also independently reviewed and agree with radiologist interpretations.   PROCEDURES:  Critical Care performed: No   MEDICATIONS ORDERED IN ED: Medications  cefTRIAXone (ROCEPHIN) 1 g in sodium chloride 0.9 % 100 mL IVPB (has no administration in time range)  azithromycin (ZITHROMAX) 500 mg in sodium chloride 0.9 % 250 mL IVPB (has no administration in time range)  nitroGLYCERIN (NITROSTAT) SL tablet 0.4 mg (has no administration in time range)  hydrOXYzine (ATARAX) tablet 25 mg (25 mg Oral Given 10/03/22 2150)  albuterol (PROVENTIL) (2.5  MG/3ML) 0.083% nebulizer solution 2.5 mg (has no administration in time range)  tiotropium (SPIRIVA) inhalation capsule ([facility] use ONLY) 18 mcg (has no administration in time range)  pregabalin (LYRICA) capsule 300 mg (300 mg Oral Given 10/03/22 2150)  venlafaxine XR (EFFEXOR-XR) 24 hr capsule 150 mg (has no administration in time range)  furosemide (LASIX) tablet 20 mg (has no administration in time range)  pantoprazole (PROTONIX) EC tablet 40 mg (has no administration in time range)  albuterol (PROVENTIL) (2.5 MG/3ML) 0.083% nebulizer solution 2.5 mg (2.5 mg Inhalation Given 10/04/22 0021)  enoxaparin (LOVENOX) injection 40 mg (has no administration in time range)  sodium chloride flush (NS) 0.9 % injection 3 mL (3 mLs Intravenous Given 10/03/22 2151)  budesonide (PULMICORT) nebulizer solution 0.25 mg (0.25 mg Nebulization Given 10/04/22 0021)  iohexol (OMNIPAQUE) 350 MG/ML injection 100 mL (100 mLs Intravenous Contrast Given 10/03/22 1922)  ipratropium-albuterol (DUONEB) 0.5-2.5 (3) MG/3ML nebulizer solution 3 mL (3 mLs Nebulization Given 10/03/22 2139)  methylPREDNISolone sodium succinate (SOLU-MEDROL) 125 mg/2 mL injection 125 mg (125 mg Intravenous Given 10/03/22 2129)  oxyCODONE (Oxy IR/ROXICODONE) immediate release tablet 5 mg (5 mg Oral Given 10/03/22 2138)  cefTRIAXone (ROCEPHIN) 2 g in sodium chloride 0.9 % 100 mL IVPB (0 g Intravenous Stopped 10/03/22 2217)  azithromycin (ZITHROMAX) 500 mg in sodium chloride 0.9 % 250 mL IVPB (0 mg Intravenous Stopped 10/03/22 2240)  sodium chloride 0.9 % bolus 1,000 mL (0 mLs Intravenous Stopped 10/03/22 2234)  ipratropium-albuterol (DUONEB) 0.5-2.5 (3) MG/3ML nebulizer solution 3 mL (3 mLs Nebulization Given 10/03/22 2139)     IMPRESSION / MDM / ASSESSMENT AND PLAN / ED COURSE  I reviewed the triage vital signs and the nursing notes.                              Differential diagnosis includes, but is not limited to, ACS, PNA, PTX, cocaine-related chest pain, chronic  pain, COPD, anemia, CHF  Patient's presentation is most consistent with acute presentation with potential threat to life or bodily function.  The patient is on the cardiac monitor to evaluate for evidence of arrhythmia and/or significant heart rate changes  62 yo F here with cough, SOB, chest pain. Suspect CAP with COPD exacerbation. CBC unremarkable. BMP at baseline. CT Angio obtained 2/2 crack cocaine use, chest pain, and hypertension and shows no signs of dissection or PE, but does show PNA. ABX,  steroids, and nebs started. Admit to medicine.   FINAL CLINICAL IMPRESSION(S) / ED DIAGNOSES   Final diagnoses:  Community acquired pneumonia, unspecified laterality  COPD exacerbation (HCC)     Rx / DC Orders   ED Discharge Orders     None        Note:  This document was prepared using Dragon voice recognition software and may include unintentional dictation errors.   Shaune Pollack, MD 10/04/22 [phone]

## 2022-10-03 NOTE — ED Triage Notes (Signed)
Pt to ED from home [facility]  Sharp stabbing CP radiating to back since last night  Also SOB since 3 weeks when dx PNA  324mg  aspirin given, pt took 2NTG tab at home, 1 inch NTG paste given by EMS, already removed  Pt was picked up at son's house, pt states "I don't have a home", son would not talk to EMS Pt states does not have somewhere to live, was living with ex.  Pt has multiple complaints, states HA, "my whole body hurts". Pt has vertical scar to chest, pt also has something hard that feels like metal plate to chest, also when pt coughed she had a soft movable mass to chest which went away when stopped coughing  Pt endorses using crack yesterday  Pt states only person we can give info to is her grandson [employer] representative

## 2022-10-03 NOTE — Assessment & Plan Note (Signed)
This was present on admission to [facility]. S/p iv salumedrol. S.p inhaled bronchodilators. On my exam, this seems to have improed. Will c.w. inhaled bronchodilators. However, defer iv steroids to rounding team.

## 2022-10-03 NOTE — H&P (Signed)
History and Physical    Patient: Meghan Jimenez JWJ:[id number] DOB: 03-Oct-1960 DOA: 10/03/2022 DOS: the patient was seen and examined on 10/03/2022 PCP: [facility] [employer]  Patient coming from: Home  Chief Complaint:  Chief Complaint  Patient presents with   Chest Pain   Shortness of Breath   HPI: Meghan Jimenez is a 62 y.o. female with medical history significant of COPD, not on home oxygen, cirrhosis, due to hepatitis C, prior MI s/p open heart surgery, with sternotomy.  Patient was in her usual state of health till about 2 days ago.  Patient generally is able to walk around without any shortness of breath.  For the last 2 days patient reports a new cough which is wet however there is no actual expectoration associated with sensation of shortness of breath whenever the patient walks on level ground.  His not associated with any fever.  Patient has been having chills.  No new chest pain except for when the patient coughs.  Please note patient has chronic chest pain with coughing and with what seems like a herniation of stomach into the anterior upper abdomen versus chest area.  No leg swelling or cramping.  Patient came to the ER because of sensation of shortness of breath.  Patient was felt to be in mild to moderate respiratory distress as per signout.  Patient was having wheezing on exam at presentation.  Patient has been treated with intravenous steroids, inhaled bronchodilators as well as ceftriaxone and azithromycin.  Medical evaluation is sought.  Patient remains on room air Review of Systems: As mentioned in the history of present illness. All other systems reviewed and are negative. Past Medical History:  Diagnosis Date   CAD (coronary artery disease)    CHF (congestive heart failure) (HCC)    Cirrhosis (HCC)    COPD (chronic obstructive pulmonary disease) (HCC)    Diabetes mellitus without complication (HCC)    Hepatitis C    Hypertension    Past Surgical History:   Procedure Laterality Date   BACK SURGERY     CARDIAC SURGERY     ESOPHAGOGASTRODUODENOSCOPY (EGD) WITH PROPOFOL N/A 12/11/2015   Procedure: ESOPHAGOGASTRODUODENOSCOPY (EGD) WITH PROPOFOL;  Surgeon: [name], MD;  Location: [facility] ENDOSCOPY;  Service: Gastroenterology;  Laterality: N/A;   LEFT HEART CATH AND CORONARY ANGIOGRAPHY N/A 11/13/2016   Procedure: Left Heart Cath and Coronary Angiography;  Surgeon: [name], MD;  Location: [facility] INVASIVE CV LAB;  Service: Cardiovascular;  Laterality: N/A;   Social History:  reports that she has been smoking cigarettes. She has been smoking an average of 1 pack per day. She has never used smokeless tobacco. She reports current alcohol use. She reports current drug use. Drug: "Crack" cocaine. Patient denies tobacco use to me. Says she last used cocaine yesterday.  Allergies  Allergen Reactions   Aspirin Anaphylaxis and Palpitations    Patient takes ibuprofen as home med, so no problem with NSAIDs  Pt took aspirin today and no reaction at all 10/03/22   Tramadol Anaphylaxis   Acetaminophen     Patient reports she is supposed to limit intake due to liver issues   Latex    Codeine Rash   Vicodin [Hydrocodone-Acetaminophen] Palpitations    Family History  Problem Relation Age of Onset   CAD Mother    Diabetes Mother    Lung cancer Father     Prior to Admission medications   Medication Sig Start Date End Date Taking? Authorizing Provider  albuterol (VENTOLIN HFA) 108 (90 Base) MCG/ACT inhaler Inhale 1-2 puffs into the lungs every 4 (four) hours as needed for wheezing or shortness of breath. 01/10/22  Yes [provider]  amLODipine (NORVASC) 10 MG tablet Take 10 mg by mouth daily. 07/19/22  Yes [provider]  carvedilol (COREG) 3.125 MG tablet Take 3.125 mg by mouth 2 (two) times daily. 11/21/21  Yes [provider]  diphenhydrAMINE (BENADRYL) 50 MG capsule Take 50 mg by mouth every 6 (six) hours as needed.   Yes  [provider]  Ferrous Sulfate (IRON) 325 (65 Fe) MG TABS Take 1 tablet by mouth once a week. 07/19/22  Yes [provider]  furosemide (LASIX) 20 MG tablet Take 20 mg by mouth daily as needed for edema or fluid. 07/19/22  Yes [provider]  hydrOXYzine (ATARAX) 25 MG tablet Take 25 mg by mouth 3 (three) times daily. 12/26/21  Yes [provider]  NITROSTAT 0.4 MG SL tablet Place 0.4 mg under the tongue every 5 (five) minutes as needed for chest pain. 07/19/22  Yes [provider]  pantoprazole (PROTONIX) 40 MG tablet Take 40 mg by mouth 2 (two) times daily before a meal.   Yes [provider]  pregabalin (LYRICA) 300 MG capsule Take 300 mg by mouth 2 (two) times daily.   Yes [provider]  SPIRIVA HANDIHALER 18 MCG inhalation capsule Place 1 capsule into inhaler and inhale daily. 03/01/22  Yes [provider]  spironolactone (ALDACTONE) 50 MG tablet Take 50 mg by mouth daily as needed. 07/19/22  Yes [provider]  venlafaxine XR (EFFEXOR-XR) 150 MG 24 hr capsule Take 150 mg by mouth daily. 11/21/21  Yes [provider]   Patient reports not taking any of her home meds in the last 2 days except nitroglycerin. Physical Exam: Vitals:   10/03/22 1834 10/03/22 1835 10/03/22 1900 10/03/22 2000  BP:  (!) 142/117 (!) 144/58 104/70  Pulse:  95 91 83  Resp:  20 (!) 24 19  Temp:  98.5 F (36.9 C)    TempSrc:  Oral    SpO2:  97% 92% 93%  Weight: 79.4 kg     Height: 5\' 3"  (1.6 m)      General: Patient does not appear to be in distress, on room air, alert awake, giving coherent account Respiratory exam: Bilateral posterior basilar crackles no expiratory wheezes heard Cardiovascular exam: Patient has a sternotomy with appreciable gap between the 2 hemisternum.  When patient coughs there seems to be a swelling that develops in the infra sternal versus epigastric region nonpulsatile.  Reducible.  However patient  reports tenderness.  This has been going on for months if not years per patient.  S1-S2 normal Abdomen soft nontender Extremities warm without edema Data Reviewed:  Labs on Admission:  Results for orders placed or performed during the hospital encounter of 10/03/22 (from the past 24 hour(s))  Basic metabolic panel     Status: Abnormal   Collection Time: 10/03/22  6:39 PM  Result Value Ref Range   Sodium 134 (L) 135 - 145 mmol/L   Potassium 3.1 (L) 3.5 - 5.1 mmol/L   Chloride 101 98 - 111 mmol/L   CO2 24 22 - 32 mmol/L   Glucose, Bld 120 (H) 70 - 99 mg/dL   BUN 11 8 - 23 mg/dL   Creatinine, Ser 0.86 0.44 - 1.00 mg/dL   Calcium 9.2 8.9 - 57.8 mg/dL   GFR, Estimated >46 >  60 mL/min   Anion gap 9 5 - 15  CBC     Status: Abnormal   Collection Time: 10/03/22  6:39 PM  Result Value Ref Range   WBC 7.8 4.0 - 10.5 K/uL   RBC 5.11 3.87 - 5.11 MIL/uL   Hemoglobin 10.9 (L) 12.0 - 15.0 g/dL   HCT 62.1 (L) 30.8 - 65.7 %   MCV 67.9 (L) 80.0 - 100.0 fL   MCH 21.3 (L) 26.0 - 34.0 pg   MCHC 31.4 30.0 - 36.0 g/dL   RDW 84.6 (H) 96.2 - 95.2 %   Platelets 150 150 - 400 K/uL   nRBC 0.0 0.0 - 0.2 %  Troponin I (High Sensitivity)     Status: None   Collection Time: 10/03/22  6:39 PM  Result Value Ref Range   Troponin I (High Sensitivity) 6 <18 ng/L  Brain natriuretic peptide     Status: None   Collection Time: 10/03/22  8:30 PM  Result Value Ref Range   B Natriuretic Peptide 69.1 0.0 - 100.0 pg/mL  Urine Drug Screen, Qualitative ([facility] only)     Status: Abnormal   Collection Time: 10/03/22  8:30 PM  Result Value Ref Range   Tricyclic, Ur Screen NONE DETECTED NONE DETECTED   Amphetamines, Ur Screen NONE DETECTED NONE DETECTED   MDMA (Ecstasy)Ur Screen NONE DETECTED NONE DETECTED   Cocaine Metabolite,Ur [place] POSITIVE (A) NONE DETECTED   Opiate, Ur Screen NONE DETECTED NONE DETECTED   Phencyclidine (PCP) Ur S NONE DETECTED NONE DETECTED   Cannabinoid 50 Ng, Ur [place] NONE DETECTED NONE DETECTED    Barbiturates, Ur Screen NONE DETECTED NONE DETECTED   Benzodiazepine, Ur Scrn NONE DETECTED NONE DETECTED   Methadone Scn, Ur NONE DETECTED NONE DETECTED   Basic Metabolic Panel: Recent Labs  Lab 10/03/22 1839  NA 134*  K 3.1*  CL 101  CO2 24  GLUCOSE 120*  BUN 11  CREATININE 0.62  CALCIUM 9.2   Liver Function Tests: No results for input(s): "AST", "ALT", "ALKPHOS", "BILITOT", "PROT", "ALBUMIN" in the last 168 hours. No results for input(s): "LIPASE", "AMYLASE" in the last 168 hours. No results for input(s): "AMMONIA" in the last 168 hours. CBC: Recent Labs  Lab 10/03/22 1839  WBC 7.8  HGB 10.9*  HCT 34.7*  MCV 67.9*  PLT 150   Cardiac Enzymes: Recent Labs  Lab 10/03/22 1839  TROPONINIHS 6    BNP (last 3 results) No results for input(s): "PROBNP" in the last 8760 hours. CBG: No results for input(s): "GLUCAP" in the last 168 hours.  Radiological Exams on Admission:  CT Angio Chest/Abd/Pel for Dissection W and/or Wo Contrast  Result Date: 10/03/2022 CLINICAL DATA:  Chest pain radiating to back, shortness of breath acute aortic syndrome suspected EXAM: CT ANGIOGRAPHY CHEST, ABDOMEN AND PELVIS TECHNIQUE: Non-contrast CT of the chest was initially obtained. Multidetector CT imaging through the chest, abdomen and pelvis was performed using the standard protocol during bolus administration of intravenous contrast. Multiplanar reconstructed images and MIPs were obtained and reviewed to evaluate the vascular anatomy. RADIATION DOSE REDUCTION: This exam was performed according to the departmental dose-optimization program which includes automated exposure control, adjustment of the mA and/or kV according to patient size and/or use of iterative reconstruction technique. CONTRAST:  OMNIPAQUE IOHEXOL 350 MG/ML SOLN COMPARISON:  CT chest, 08/13/2022 FINDINGS: CTA CHEST FINDINGS VASCULAR Aorta: Satisfactory opacification of the aorta. Normal contour and caliber of the thoracic  aorta. No evidence of aneurysm, dissection, or other  acute aortic pathology. Scattered aortic atherosclerosis. Cardiovascular: No evidence of pulmonary embolism on limited non-tailored examination. Normal heart size. Left coronary artery calcifications status post median sternotomy and CABG. No pericardial effusion. Review of the MIP images confirms the above findings. NON VASCULAR Mediastinum/Nodes: No enlarged mediastinal, hilar, or axillary lymph nodes. Thyroid gland, trachea, and esophagus demonstrate no significant findings. Lungs/Pleura: Diffuse bilateral bronchial wall thickening. Dependent bibasilar consolidation and or atelectasis, increased in the left lung base compared to prior examination, diminished in the right lung base (series 5, image 104). No pleural effusion or pneumothorax. 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 No acute osseous findings. Review of the MIP images confirms the above findings. CTA ABDOMEN AND PELVIS FINDINGS VASCULAR Normal contour and caliber of the abdominal aorta. Severe mixed calcific atherosclerosis. No evidence of aneurysm, dissection, or other acute aortic pathology. Standard branching pattern of the abdominal aorta with solitary bilateral renal arteries. Review of the MIP images confirms the above findings. NON-VASCULAR Hepatobiliary: No solid liver abnormality is seen. Coarse, nodular cirrhotic morphology of the liver. Hepatomegaly, maximum coronal span 21.8 cm. Status post cholecystectomy. No biliary ductal dilatation. Pancreas: Unremarkable. No pancreatic ductal dilatation or surrounding inflammatory changes. Spleen: Normal in size without significant abnormality. Adrenals/Urinary Tract: Adrenal glands are unremarkable. Punctuate nonobstructive calculus of the midportion of the left kidney (series 5, image 178). No right-sided calculi, ureteral calculi, or hydronephrosis. Bladder is unremarkable.  Stomach/Bowel: Stomach is within normal limits. Appendix is not clearly visualized. No evidence of bowel wall thickening, distention, or inflammatory changes. Lymphatic: No enlarged abdominal or pelvic lymph nodes. Reproductive: No mass or other significant abnormality. Other: No abdominal wall hernia or abnormality. No ascites. Musculoskeletal: No acute osseous findings. IMPRESSION: 1. Normal contour and caliber of the thoracic and abdominal aorta. No evidence of aneurysm, dissection, or other acute aortic pathology. Severe mixed calcific atherosclerosis primarily of the abdominal aorta. 2. Dependent bibasilar consolidation and or atelectasis, increased in the left lung base compared to prior examination, diminished in the right lung base. Findings are consistent with infection or aspiration, possibly ongoing given fluctuance of findings. 3. Diffuse bilateral bronchial wall thickening, consistent with nonspecific infectious or inflammatory bronchitis. 4. Coronary artery disease. 5. Cirrhosis and hepatomegaly. 6. Nonobstructive left nephrolithiasis. Aortic Atherosclerosis (ICD10-I70.0). Electronically Signed   By: Jearld Lesch M.D.   On: 10/03/2022 19:47   DG Chest 1 View  Result Date: 10/03/2022 CLINICAL DATA:  Chest pain EXAM: CHEST  1 VIEW COMPARISON:  Radiograph and CT 08/13/2022 FINDINGS: Stable cardiomediastinal silhouette. Mediastinal clips. Left basilar atelectasis. The previous right basilar airspace opacity has resolved. No pleural effusion or pneumothorax. Cervical spine fusion hardware. No displaced rib fractures. Remote left rib fractures. IMPRESSION: No active disease. Electronically Signed   By: Minerva Fester M.D.   On: 10/03/2022 19:06    EKG: Independently reviewed. Sinus rhythm RBBB   Assessment and Plan: * Pneumonia This has to be viewed in context of patient's admission in April to [facility] ER.  At the time patient was treated for COPD exacerbation. CT chest in April showed a right lower  lung field posterior consolidation. This is still present today after over 6 weeks. Review of images by me today suggest bronchiectatic changes in the affected lung field. Given immunocompromised state, patient may have atypical infections as well. Follow up blood cultures as ordered. S/p ceftriaxone and azithromycin in ER. Continue with same. I will request a sputum induction with testing for atypical infections in  this lady. Not associated with hypxoia. No sepsis this evening. Check swallow eval  COPD exacerbation (HCC) This was present on admission to Er. S/p iv salumedrol. S.p inhaled bronchodilators. On my exam, this seems to have improed. Will c.w. inhaled bronchodilators. However, defer iv steroids to rounding team.   Epigastric hernia Will need routine surgery input from [facility] cardiothoracic surgeon. In close proximity to sternotomy  History of cocaine abuse [facility]) Patient used cocaine just yesterday. Has not been using coreg at home . Hold coreg. Resume once safe.      Advance Care Planning:   Code Status: Full Code patient has previously advised of DNR status. Curenlty "not sure". Says family wants her full code. And she wants to keep family wihses in mind.  Consults: consider pulm consult in AM  Family Communication: per patient.  Severity of Illness: The appropriate patient status for this patient is INPATIENT. Inpatient status is judged to be reasonable and necessary in order to provide the required intensity of service to ensure the patient's safety. The patient's presenting symptoms, physical exam findings, and initial radiographic and laboratory data in the context of their chronic comorbidities is felt to place them at high risk for further clinical deterioration. Furthermore, it is not anticipated that the patient will be medically stable for discharge from the hospital within 2 midnights of admission.   * I certify that at the point of admission it is my clinical judgment that  the patient will require inpatient hospital care spanning beyond 2 midnights from the point of admission due to high intensity of service, high risk for further deterioration and high frequency of surveillance required.*  Author: Nolberto Hanlon, MD 10/03/2022 9:21 PM  For on call review [email or web address].

## 2022-10-03 NOTE — ED Notes (Addendum)
PT informed NT(writer) her breathing treatment was complete.

## 2022-10-03 NOTE — Assessment & Plan Note (Signed)
Patient used cocaine just yesterday. Has not been using coreg at home . Hold coreg. Resume once safe.

## 2022-10-03 NOTE — Assessment & Plan Note (Addendum)
This has to be viewed in context of patient's admission in April to [facility] ER.  At the time patient was treated for COPD exacerbation. CT chest in April showed a right lower lung field posterior consolidation. This is still present today after over 6 weeks. Review of images by me today suggest bronchiectatic changes in the affected lung field. Given immunocompromised state, patient may have atypical infections as well. Follow up blood cultures as ordered. S/p ceftriaxone and azithromycin in ER. Continue with same. I will request a sputum induction with testing for atypical infections in this lady. Not associated with hypxoia. No sepsis this evening. Check swallow eval

## 2022-10-03 NOTE — ED Notes (Signed)
Pt states she is not aware that we are awaiting a urine sample from her. States she will call out when she can provide one

## 2022-10-03 NOTE — ED Notes (Signed)
NT Hydrographic surveyor) placed bedpan under pt. PT urine output .

## 2022-10-03 NOTE — ED Notes (Signed)
Provided pt with snack box and ginger ale

## 2022-10-04 DIAGNOSIS — R0602 Shortness of breath: Secondary | ICD-10-CM | POA: Diagnosis present

## 2022-10-04 DIAGNOSIS — F1410 Cocaine abuse, uncomplicated: Secondary | ICD-10-CM | POA: Diagnosis present

## 2022-10-04 DIAGNOSIS — K7460 Unspecified cirrhosis of liver: Secondary | ICD-10-CM | POA: Diagnosis present

## 2022-10-04 DIAGNOSIS — F17210 Nicotine dependence, cigarettes, uncomplicated: Secondary | ICD-10-CM | POA: Diagnosis present

## 2022-10-04 DIAGNOSIS — Z8249 Family history of ischemic heart disease and other diseases of the circulatory system: Secondary | ICD-10-CM | POA: Diagnosis not present

## 2022-10-04 DIAGNOSIS — J189 Pneumonia, unspecified organism: Secondary | ICD-10-CM | POA: Diagnosis not present

## 2022-10-04 DIAGNOSIS — Z1152 Encounter for screening for COVID-19: Secondary | ICD-10-CM | POA: Diagnosis not present

## 2022-10-04 DIAGNOSIS — Z886 Allergy status to analgesic agent status: Secondary | ICD-10-CM | POA: Diagnosis not present

## 2022-10-04 DIAGNOSIS — I2510 Atherosclerotic heart disease of native coronary artery without angina pectoris: Secondary | ICD-10-CM | POA: Diagnosis present

## 2022-10-04 DIAGNOSIS — K224 Dyskinesia of esophagus: Secondary | ICD-10-CM | POA: Diagnosis present

## 2022-10-04 DIAGNOSIS — Z888 Allergy status to other drugs, medicaments and biological substances status: Secondary | ICD-10-CM | POA: Diagnosis not present

## 2022-10-04 DIAGNOSIS — B1920 Unspecified viral hepatitis C without hepatic coma: Secondary | ICD-10-CM | POA: Diagnosis present

## 2022-10-04 DIAGNOSIS — I5032 Chronic diastolic (congestive) heart failure: Secondary | ICD-10-CM | POA: Diagnosis present

## 2022-10-04 DIAGNOSIS — D509 Iron deficiency anemia, unspecified: Secondary | ICD-10-CM | POA: Diagnosis present

## 2022-10-04 DIAGNOSIS — E119 Type 2 diabetes mellitus without complications: Secondary | ICD-10-CM | POA: Diagnosis present

## 2022-10-04 DIAGNOSIS — G8929 Other chronic pain: Secondary | ICD-10-CM | POA: Diagnosis present

## 2022-10-04 DIAGNOSIS — R1314 Dysphagia, pharyngoesophageal phase: Secondary | ICD-10-CM | POA: Diagnosis present

## 2022-10-04 DIAGNOSIS — I110 Hypertensive heart disease with heart failure: Secondary | ICD-10-CM | POA: Diagnosis present

## 2022-10-04 DIAGNOSIS — K439 Ventral hernia without obstruction or gangrene: Secondary | ICD-10-CM | POA: Diagnosis present

## 2022-10-04 DIAGNOSIS — Z885 Allergy status to narcotic agent status: Secondary | ICD-10-CM | POA: Diagnosis not present

## 2022-10-04 DIAGNOSIS — J690 Pneumonitis due to inhalation of food and vomit: Secondary | ICD-10-CM | POA: Diagnosis present

## 2022-10-04 DIAGNOSIS — Z66 Do not resuscitate: Secondary | ICD-10-CM | POA: Diagnosis present

## 2022-10-04 DIAGNOSIS — J441 Chronic obstructive pulmonary disease with (acute) exacerbation: Secondary | ICD-10-CM | POA: Diagnosis present

## 2022-10-04 DIAGNOSIS — Z833 Family history of diabetes mellitus: Secondary | ICD-10-CM | POA: Diagnosis not present

## 2022-10-04 DIAGNOSIS — I4510 Unspecified right bundle-branch block: Secondary | ICD-10-CM | POA: Diagnosis present

## 2022-10-04 DIAGNOSIS — I252 Old myocardial infarction: Secondary | ICD-10-CM | POA: Diagnosis not present

## 2022-10-04 LAB — BASIC METABOLIC PANEL
Anion gap: 3 — ABNORMAL LOW (ref 5–15)
BUN: 10 mg/dL (ref 8–23)
CO2: 26 mmol/L (ref 22–32)
Calcium: 8.8 mg/dL — ABNORMAL LOW (ref 8.9–10.3)
Chloride: 106 mmol/L (ref 98–111)
Creatinine, Ser: 0.66 mg/dL (ref 0.44–1.00)
GFR, Estimated: >60 mL/min (ref >60)
Glucose, Bld: 210 mg/dL — ABNORMAL HIGH (ref 70–99)
Potassium: 3.4 mmol/L — ABNORMAL LOW (ref 3.5–5.1)
Sodium: 135 mmol/L (ref 135–145)

## 2022-10-04 LAB — PROTIME-INR
INR: 1.2 (ref 0.8–1.2)
Prothrombin Time: 15.9 seconds — ABNORMAL HIGH (ref 11.4–15.2)

## 2022-10-04 LAB — CBC
HCT: 34.6 % — ABNORMAL LOW (ref 36.0–46.0)
Hemoglobin: 11 g/dL — ABNORMAL LOW (ref 12.0–15.0)
MCH: 21.7 pg — ABNORMAL LOW (ref 26.0–34.0)
MCHC: 31.8 g/dL (ref 30.0–36.0)
MCV: 68.2 fL — ABNORMAL LOW (ref 80.0–100.0)
Platelets: 147 10*3/uL — ABNORMAL LOW (ref 150–400)
RBC: 5.07 MIL/uL (ref 3.87–5.11)
RDW: 20.1 % — ABNORMAL HIGH (ref 11.5–15.5)
WBC: 4.8 10*3/uL (ref 4.0–10.5)
nRBC: 0 % (ref 0.0–0.2)

## 2022-10-04 LAB — CULTURE, BLOOD (ROUTINE X 2): Culture: NO GROWTH

## 2022-10-04 LAB — APTT: aPTT: 28 seconds (ref 24–36)

## 2022-10-04 MED ORDER — LIDOCAINE 5 % EX PTCH
1.0000 | MEDICATED_PATCH | CUTANEOUS | Status: DC
  Administered 2022-10-04 – 2022-10-06 (×3): 1 via TRANSDERMAL
  Filled 2022-10-04 (×3): qty 1

## 2022-10-04 MED ORDER — ENSURE ENLIVE PO LIQD
237.0000 mL | Freq: Three times a day (TID) | ORAL | Status: DC
  Administered 2022-10-04 – 2022-10-06 (×6): 237 mL via ORAL

## 2022-10-04 MED ORDER — OXYCODONE HCL 5 MG PO TABS
5.0000 mg | ORAL_TABLET | Freq: Four times a day (QID) | ORAL | Status: DC | PRN
  Administered 2022-10-04 – 2022-10-06 (×7): 5 mg via ORAL
  Filled 2022-10-04 (×7): qty 1

## 2022-10-04 MED ORDER — MELATONIN 5 MG PO TABS
5.0000 mg | ORAL_TABLET | Freq: Every evening | ORAL | Status: DC | PRN
  Administered 2022-10-05 (×2): 5 mg via ORAL
  Filled 2022-10-04 (×2): qty 1

## 2022-10-04 NOTE — Progress Notes (Signed)
Progress Note   Patient: Meghan Jimenez ZOX:[id number] DOB: 1961/03/19 DOA: 10/03/2022     0 DOS: the patient was seen and examined on 10/04/2022   Brief hospital course:  Meghan Jimenez is a 62 y.o. female with medical history significant of COPD, not on home oxygen, cirrhosis, due to hepatitis C, prior MI s/p open heart surgery, with sternotomy.  Patient was in her usual state of health till about 2 days ago.  Patient generally is able to walk around without any shortness of breath.  For the last 2 days patient reports a new cough which is wet however there is no actual expectoration associated with sensation of shortness of breath whenever the patient walks on level ground.  His not associated with any fever.  Patient has been having chills.  No new chest pain except for when the patient coughs.  Please note patient has chronic chest pain with coughing and with what seems like a herniation of stomach into the anterior upper abdomen versus chest area.  No leg swelling or cramping.  Patient came to the ER because of sensation of shortness of breath.   Patient was felt to be in mild to moderate respiratory distress as per signout.  Patient was having wheezing on exam at presentation.  Patient has been treated with intravenous steroids, inhaled bronchodilators as well as ceftriaxone and azithromycin.  Medical evaluation is sought.  Patient remains on room air      Assessment and Plan: * Pneumonia Concern for possible aspiration pneumonia Patient with a history of esophageal dysphagia and per patient has had dilatation in the past Noted to have worsening dysphagia with solids Seen and evaluated by speech therapy, started on a full liquid diet Will consult GI Continue antibiotic therapy with Rocephin and Zithromax Follow-up results of blood and sputum culture    COPD exacerbation (HCC) Dose of IV Solu-Medrol in the ER Continue scheduled and as needed bronchodilator therapy Continue inhaled  steroids   Epigastric hernia Will need routine surgery input from [facility] cardiothoracic surgeon. In close proximity to sternotomy   History of cocaine abuse [facility]) Patient used cocaine just yesterday. Has not been using coreg at home . Hold coreg. Resume once safe.        Subjective: Patient is seen and examined at the bedside.  Complains of pleuritic chest pain  Physical Exam: Vitals:   10/04/22 1245 10/04/22 1330 10/04/22 1400 10/04/22 1430  BP:  (!) 150/63 (!) 133/51 (!) 127/51  Pulse: 73 70 83 76  Resp:  (!) 22 (!) 23 (!) 25  Temp:    98 F (36.7 C)  TempSrc:    Oral  SpO2: 94%     Weight:      Height:      General: Patient does not appear to be in distress, on room air, alert awake, giving coherent account Respiratory exam: Bilateral posterior basilar crackles no expiratory wheezes heard Cardiovascular exam: Patient has a sternotomy with appreciable gap between the 2 hemisternum.  When patient coughs there seems to be a swelling that develops in the infra sternal versus epigastric region nonpulsatile.  Reducible.  However patient reports tenderness.  This has been going on for months if not years per patient.  S1-S2 normal Abdomen soft nontender Extremities warm without edema  Data Reviewed: Labs reviewed.  Potassium 3.4, white count 4.8 There are no new results to review at this time.  Family Communication: None  Disposition: Status is: Inpatient Remains inpatient appropriate because: Needs  IV antibiotics, GI evaluation  Planned Discharge Destination: Home    Time spent: 35  minutes  Author: Lucile Shutters, MD 10/04/2022 3:35 PM  For on call review [email or web address].

## 2022-10-04 NOTE — Consult Note (Signed)
Wyline Mood , MD [address], [place], [place], [address] [address], [address], [place], [place], [address] Phone: [phone]  Fax: [phone]  Consultation  Referring Provider:     Dr Joylene Igo Primary Care Physician:  [facility] Primary Gastroenterologist:  [facility]          Reason for Consultation:     issues with swalllowing   Date of Admission:  10/03/2022 Date of Consultation:  10/04/2022         HPI:   Meghan Jimenez is a 62 y.o. female presented to the emergency room on 10/03/2022 with chest pain shortness of breath of 2 days duration diagnosed with COPD exacerbation, pneumonia.  CT scan of the chest angiogram protocol to rule out PE showed consolidation or atelectasis at the bases increased in the left lung compared to prior examination consistent with infection or aspiration.  Diffuse bronchial wall thickening consistent with inflammatory bronchitis.  Hemoglobin 11 g with an MCV of 68 INR 1.2 samples for AFB fungal cultures have been ordered pending creatinine normal urine drug screen positive for cocaine.  BMP normal.  I have been consulted to evaluate for dysphagia.  She has had prior issues with dysphagia followed at [facility] back in 2018 where she had an upper endoscopy showing abnormal motility in the upper third of the esophagus in the lower third of the esophagus Botox was injected at the esophageal sphincter was wide open empirically dilated to 20 mm no strictures were noted.  It was noted that despite the dilation and Botox injection did not help with her swallowing.  She was advised to follow-up in the clinic.  And she no showed subsequently.  She called back in 2020 with similar problems and there is no subsequent follow-up.  She says she has had issues swallowing for many years nothing new at this point of time has difficulty swallowing solids she admits she has no teeth needs to get false teeth and she cannot chew as a result.  She can drink  liquids when I went into the room she completed her liquid diet there is no vomiting there is no regurgitation there is no coughing.  She has had surgery on her sternum that has caused her to have difficulty with her swallowing she recollects she says she needs to get Botox injected into her esophagus like she has had it done in the past.  Past Medical History:  Diagnosis Date   CAD (coronary artery disease)    CHF (congestive heart failure) (HCC)    Cirrhosis (HCC)    COPD (chronic obstructive pulmonary disease) (HCC)    Diabetes mellitus without complication (HCC)    Hepatitis C    Hypertension     Past Surgical History:  Procedure Laterality Date   BACK SURGERY     CARDIAC SURGERY     ESOPHAGOGASTRODUODENOSCOPY (EGD) WITH PROPOFOL N/A 12/11/2015   Procedure: ESOPHAGOGASTRODUODENOSCOPY (EGD) WITH PROPOFOL;  Surgeon: [name], MD;  Location: [facility] ENDOSCOPY;  Service: Gastroenterology;  Laterality: N/A;   LEFT HEART CATH AND CORONARY ANGIOGRAPHY N/A 11/13/2016   Procedure: Left Heart Cath and Coronary Angiography;  Surgeon: [name], MD;  Location: [facility] INVASIVE CV LAB;  Service: Cardiovascular;  Laterality: N/A;    Prior to Admission medications   Medication Sig Start Date End Date Taking? Authorizing Provider  albuterol (VENTOLIN HFA) 108 (90 Base) MCG/ACT inhaler Inhale 1-2 puffs into the lungs every 4 (four) hours as needed for  wheezing or shortness of breath. 01/10/22  Yes [provider]  amLODipine (NORVASC) 10 MG tablet Take 10 mg by mouth daily. 07/19/22  Yes [provider]  carvedilol (COREG) 3.125 MG tablet Take 3.125 mg by mouth 2 (two) times daily. 11/21/21  Yes [provider]  diphenhydrAMINE (BENADRYL) 50 MG capsule Take 50 mg by mouth every 6 (six) hours as needed.   Yes [provider]  Ferrous Sulfate (IRON) 325 (65 Fe) MG TABS Take 1 tablet by mouth once a week. 07/19/22  Yes [provider]  furosemide (LASIX) 20  MG tablet Take 20 mg by mouth daily as needed for edema or fluid. 07/19/22  Yes [provider]  hydrOXYzine (ATARAX) 25 MG tablet Take 25 mg by mouth 3 (three) times daily. 12/26/21  Yes [provider]  NITROSTAT 0.4 MG SL tablet Place 0.4 mg under the tongue every 5 (five) minutes as needed for chest pain. 07/19/22  Yes [provider]  pantoprazole (PROTONIX) 40 MG tablet Take 40 mg by mouth 2 (two) times daily before a meal.   Yes [provider]  pregabalin (LYRICA) 300 MG capsule Take 300 mg by mouth 2 (two) times daily.   Yes [provider]  SPIRIVA HANDIHALER 18 MCG inhalation capsule Place 1 capsule into inhaler and inhale daily. 03/01/22  Yes [provider]  spironolactone (ALDACTONE) 50 MG tablet Take 50 mg by mouth daily as needed. 07/19/22  Yes [provider]  venlafaxine XR (EFFEXOR-XR) 150 MG 24 hr capsule Take 150 mg by mouth daily. 11/21/21  Yes [provider]    Family History  Problem Relation Age of Onset   CAD Mother    Diabetes Mother    Lung cancer Father      Social History   Tobacco Use   Smoking status: Every Day    Packs/day: 1    Types: Cigarettes   Smokeless tobacco: Never   Tobacco comments:    SMOKES 3-4 PKS/DAY  Substance Use Topics   Alcohol use: Yes    Comment: Drinks occasionally every week   Drug use: Yes    Types: "Crack" cocaine    Comment: last used 3 days ago    Allergies as of 10/03/2022 - Review Complete 10/03/2022  Allergen Reaction Noted   Aspirin Anaphylaxis and Palpitations 05/24/2015   Tramadol Anaphylaxis 05/24/2015   Acetaminophen  12/22/2013   Latex  05/24/2015   Codeine Rash    Vicodin [hydrocodone-acetaminophen] Palpitations 11/11/2015    Review of Systems:    All systems reviewed and negative except where noted in HPI.   Physical Exam:  Vital signs in last 24 hours: Temp:  [98.1 F (36.7 C)-98.5 F (36.9 C)] 98.1 F (36.7 C) (06/05  0841) Pulse Rate:  [69-95] 71 (06/05 1200) Resp:  [14-24] 15 (06/05 0830) BP: (104-144)/(53-117) 142/63 (06/05 1200) SpO2:  [91 %-97 %] 95 % (06/05 1200) Weight:  [79.4 kg] 79.4 kg (06/04 1834)   General:   Pleasant, cooperative in NAD Head:  Normocephalic and atraumatic. Eyes:   No icterus.   Conjunctiva pink. PERRLA. Ears:  Normal auditory acuity. Neck:  Supple; no masses or thyroidomegaly Lungs: Chest wall deformity midline respirations even and unlabored. Lungs clear to auscultation bilaterally.   No wheezes, crackles, or rhonchi.  Heart:  Regular rate and rhythm;  Without murmur, clicks, rubs or gallops Abdomen:  Soft, nondistended, nontender. Normal bowel sounds. No appreciable masses or hepatomegaly.  No rebound or guarding.  Neurologic:  Alert and oriented x3;  grossly normal neurologically. Skin:  Intact without significant lesions or rashes. Cervical Nodes:  No significant cervical adenopathy. Psych:  Alert and cooperative. Normal affect.  LAB RESULTS: Recent Labs    10/03/22 1839 10/03/22 2119 10/04/22 0411  WBC 7.8 6.7 4.8  HGB 10.9* 10.5* 11.0*  HCT 34.7* 32.9* 34.6*  PLT 150 148* 147*   BMET Recent Labs    10/03/22 1839 10/03/22 2119 10/04/22 0411  NA 134*  --  135  K 3.1*  --  3.4*  CL 101  --  106  CO2 24  --  26  GLUCOSE 120*  --  210*  BUN 11  --  10  CREATININE 0.62 0.54 0.66  CALCIUM 9.2  --  8.8*   LFT Recent Labs    10/03/22 2119  PROT 7.1  ALBUMIN 3.6  AST 24  ALT 19  ALKPHOS 86  BILITOT 0.8  BILIDIR 0.2  IBILI 0.6   PT/INR Recent Labs    10/04/22 0411  LABPROT 15.9*  INR 1.2    STUDIES: CT Angio Chest/Abd/Pel for Dissection W and/or Wo Contrast  Result Date: 10/03/2022 CLINICAL DATA:  Chest pain radiating to back, shortness of breath acute aortic syndrome suspected EXAM: CT ANGIOGRAPHY CHEST, ABDOMEN AND PELVIS TECHNIQUE: Non-contrast CT of the chest was initially obtained. Multidetector CT imaging through the chest, abdomen  and pelvis was performed using the standard protocol during bolus administration of intravenous contrast. Multiplanar reconstructed images and MIPs were obtained and reviewed to evaluate the vascular anatomy. RADIATION DOSE REDUCTION: This exam was performed according to the departmental dose-optimization program which includes automated exposure control, adjustment of the mA and/or kV according to patient size and/or use of iterative reconstruction technique. CONTRAST:  OMNIPAQUE IOHEXOL 350 MG/ML SOLN COMPARISON:  CT chest, 08/13/2022 FINDINGS: CTA CHEST FINDINGS VASCULAR Aorta: Satisfactory opacification of the aorta. Normal contour and caliber of the thoracic aorta. No evidence of aneurysm, dissection, or other acute aortic pathology. Scattered aortic atherosclerosis. Cardiovascular: No evidence of pulmonary embolism on limited non-tailored examination. Normal heart size. Left coronary artery calcifications status post median sternotomy and CABG. No pericardial effusion. Review of the MIP images confirms the above findings. NON VASCULAR Mediastinum/Nodes: No enlarged mediastinal, hilar, or axillary lymph nodes. Thyroid gland, trachea, and esophagus demonstrate no significant findings. Lungs/Pleura: Diffuse bilateral bronchial wall thickening. Dependent bibasilar consolidation and or atelectasis, increased in the left lung base compared to prior examination, diminished in the right lung base (series 5, image 104). No pleural effusion or pneumothorax. 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 No acute osseous findings. Review of the MIP images confirms the above findings. CTA ABDOMEN AND PELVIS FINDINGS VASCULAR Normal contour and caliber of the abdominal aorta. Severe mixed calcific atherosclerosis. No evidence of aneurysm, dissection, or other acute aortic pathology. Standard branching pattern of the abdominal aorta with solitary bilateral renal  arteries. Review of the MIP images confirms the above findings. NON-VASCULAR Hepatobiliary: No solid liver abnormality is seen. Coarse, nodular cirrhotic morphology of the liver. Hepatomegaly, maximum coronal span 21.8 cm. Status post cholecystectomy. No biliary ductal dilatation. Pancreas: Unremarkable. No pancreatic ductal dilatation or surrounding inflammatory changes. Spleen: Normal in size without significant abnormality. Adrenals/Urinary Tract: Adrenal glands are unremarkable. Punctuate nonobstructive calculus of the midportion of the left kidney (series 5, image 178). No right-sided calculi, ureteral calculi, or hydronephrosis. Bladder is unremarkable. Stomach/Bowel: Stomach is within normal limits. Appendix is not clearly  visualized. No evidence of bowel wall thickening, distention, or inflammatory changes. Lymphatic: No enlarged abdominal or pelvic lymph nodes. Reproductive: No mass or other significant abnormality. Other: No abdominal wall hernia or abnormality. No ascites. Musculoskeletal: No acute osseous findings. IMPRESSION: 1. Normal contour and caliber of the thoracic and abdominal aorta. No evidence of aneurysm, dissection, or other acute aortic pathology. Severe mixed calcific atherosclerosis primarily of the abdominal aorta. 2. Dependent bibasilar consolidation and or atelectasis, increased in the left lung base compared to prior examination, diminished in the right lung base. Findings are consistent with infection or aspiration, possibly ongoing given fluctuance of findings. 3. Diffuse bilateral bronchial wall thickening, consistent with nonspecific infectious or inflammatory bronchitis. 4. Coronary artery disease. 5. Cirrhosis and hepatomegaly. 6. Nonobstructive left nephrolithiasis. Aortic Atherosclerosis (ICD10-I70.0). Electronically Signed   By: Jearld Lesch M.D.   On: 10/03/2022 19:47   DG Chest 1 View  Result Date: 10/03/2022 CLINICAL DATA:  Chest pain EXAM: CHEST  1 VIEW COMPARISON:   Radiograph and CT 08/13/2022 FINDINGS: Stable cardiomediastinal silhouette. Mediastinal clips. Left basilar atelectasis. The previous right basilar airspace opacity has resolved. No pleural effusion or pneumothorax. Cervical spine fusion hardware. No displaced rib fractures. Remote left rib fractures. IMPRESSION: No active disease. Electronically Signed   By: Minerva Fester M.D.   On: 10/03/2022 19:06      Impression / Plan:   Meghan Jimenez is a 62 y.o. y/o female with history of what appears to be esophageal dysmotility evaluated at [facility] back in 20 18, no strictures noted on prior endoscopy admitted with pneumonia concerning for aspiration I been consulted to evaluate for dysphagia.  Very likely the issues with dysphagia are related to esophageal dysmotility as well as due to the lack of teeth.  She has absolutely no dentition.  Without teeth it would be hard for her to chew and swallow.  Plan  With respiratory failure, pneumonia will not be able to perform any endoscopic procedure with anesthesia till the infection resolves and its safe.  No acute indication to perform an endoscopy as she has no difficulty swallowing liquids and is at her baseline in terms of swallowing function She is able to tolerate her food at baseline.  She has difficulty chewing meat as she has no teeth.  I explained to her that she needs false teeth otherwise she cannot chew her meat and it will get stuck.  Please help refer the patient to her dentist at the discharge Keep head end of the bed elevated at 45 degrees angle suggest to use PPI to reduce acid secretion She has a microcytic anemia consider further evaluation from the internal medicine point of view with appropriate tests.   I will sign off.  Please call me if any further GI concerns or questions.  We would like to thank you for the opportunity to participate in the care of [employer].   Thank you for involving me in the care of this patient.       LOS: 0 days   Wyline Mood, MD  10/04/2022, 12:55 PM

## 2022-10-04 NOTE — ED Notes (Signed)
Pt given graham cracker and ice chip snack per request

## 2022-10-04 NOTE — ED Notes (Signed)
This RN to bedside to help pt. Up to bedside commode. Pt. Up to commode with minimal x1 assist. Pt. Unsteady on her feet. Pt. Urinated. While up and moving about pt's bed linens refreshed, she began coughing a lot. This RN offered her a breathing treatment.

## 2022-10-04 NOTE — Evaluation (Signed)
Clinical/Bedside Swallow Evaluation Patient Details  Name: Meghan PRUETER MRN: [id number] Date of Birth: [birth year]  Today's Date: 10/04/2022 Time: SLP Start Time (ACUTE ONLY): 1145 SLP Stop Time (ACUTE ONLY): 1245 SLP Time Calculation (min) (ACUTE ONLY): 60 min  Past Medical History:  Past Medical History:  Diagnosis Date   CAD (coronary artery disease)    CHF (congestive heart failure) (HCC)    Cirrhosis (HCC)    COPD (chronic obstructive pulmonary disease) (HCC)    Diabetes mellitus without complication (HCC)    Hepatitis C    Hypertension    Past Surgical History:  Past Surgical History:  Procedure Laterality Date   BACK SURGERY     CARDIAC SURGERY     ESOPHAGOGASTRODUODENOSCOPY (EGD) WITH PROPOFOL N/A 12/11/2015   Procedure: ESOPHAGOGASTRODUODENOSCOPY (EGD) WITH PROPOFOL;  Surgeon: [name], MD;  Location: [facility] ENDOSCOPY;  Service: Gastroenterology;  Laterality: N/A;   LEFT HEART CATH AND CORONARY ANGIOGRAPHY N/A 11/13/2016   Procedure: Left Heart Cath and Coronary Angiography;  Surgeon: [name], MD;  Location: [facility] INVASIVE CV LAB;  Service: Cardiovascular;  Laterality: N/A;   HPI:  Pt is a 62 y.o. 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  Patient generally is able to walk around without much shortness of breath.  For the last 2 days patient reports a new cough which is wet however there is no actual expectoration associated with sensation of shortness of breath whenever the patient walks on level ground.  Has not associated fever.  Patient has been having chills.  No new chest pain except for when the patient coughs.  Please Note: patient has chronic chest pain with coughing and with what seems like a Herniation of Stomach into the anterior upper abdomen-chest area.   Patient came to the ER because of sensation of shortness of breath.     Patient was having wheezing on exam at presentation.  Patient has been treated with intravenous steroids, inhaled bronchodilators as well as ceftriaxone and azithromycin.  Patient remains on room air.   PER CHART GI NOTES FROM [facility] 07/2016: "recent EGD on 07/28/16 with Dr. Gayla Medicus didn't help her swallowing. She stated that she has increased heartburn, can only swallow liquids still, and is having back/neck/shoulder pain. Advised her per Dr. Jessee Avers, that he does not want her to have another EGD, but that he does want to follow-up with her in clinic. Advised her that the earliest appointment he has available is 09/06/16 at 09:00 am, but that we can add her to the cancellation list. She was agreeable to advisement/appointment given. She stated that she is still having pain with difficulty swallowing, but can swallow liquids. Advised her to go to her ER if her swallowing becomes worse.".  Chest CT: Lungs/Pleura: Diffuse bilateral bronchial wall thickening. Dependent  bibasilar consolidation and or atelectasis, increased in the left  lung base compared to prior examination, diminished in the right  lung base. No pleural effusion.    Assessment / Plan / Recommendation  Clinical Impression   Pt seen for BSE today. Pt awake, verbal but seemed distracted at times. Pt conversed in length about her Esophageal phase Dysmotility w/ REGURGITATION of solids and GI visits -- "I got BOTOX in my Esophagus at [facility] and [facility]", seemed to be a few years ago. Pt appeared to exhibit decreased  insight into her medical issues. She stated she was "tired of my body being like this". She was verbal, followed all commands. Appeared weak/deconditioned.   On RA; afebrile. WBC WNL.  OF NOTE: PER CHART HISTORY-- pt had GI f/u until 11/2026 when she was scheduled to be seen at an appt, no-shoed, then did not have any more contact w/ her GI. The last Office note by them was 07/2026:  "recent EGD on 07/28/16 with Dr. Gayla Medicus didn't help her swallowing. She stated that she has increased heartburn, can only swallow liquids still, and is having back/neck/shoulder pain. Advised her per Dr. Jessee Avers, that he does not want her to have another EGD, but that he does want to follow-up with her in clinic. Advised her that the earliest appointment he has available is 09/06/16 at 09:00 am, but that we can add her to the cancellation list. She was agreeable to advisement/appointment given. She stated that she is still having pain with difficulty swallowing, but can swallow liquids. Advised her to go to her ER if her swallowing becomes worse.". It appears pt's Esophageal phase Dysmotility has been ongoing, CHRONIC issue for many years and cannot be corrected. Suspect she may need discussion of GOC including alternative means of feeding(PEG) for her future.   Pt appears to present w/ grossly functional oropharyngeal phase swallowing w/ No oropharyngeal phase dysphagia noted, No neuromuscular deficits noted. Pt consumed po's given w/ No immediate, overt clinical s/s of aspiration during po trials. Pt appears at reduced risk for aspiration from an oropharyngeal phase standpoint when following general aspiration precautions.   However, pt does have challenging factors that could impact her oropharyngeal swallowing to include the SEVERE ESOPHAGEAL PHASE DYSMOTILITY AS DESCRIBED IN CHART NOTES FROM [facility] -- CURRENT GI CONSULT HAS BEEN PLACED(see their note also). Pt is also deconditioned and is Edentulous at Baseline. These factors can increase risk for aspiration post swallowing of any RETROGRADE activity, REFLUX activity, as well as decreased oral intake overall.   During po trials of liquids and loose purees, pt consumed consistencies w/ no overt coughing, decline in vocal quality, or change in respiratory presentation during/post trials. O2 sats remained 96%. Oral phase appeared grossly [facility] w/ timely bolus  management and control of bolus propulsion for A-P transfer for swallowing. Pt exhibited min increased effort and time w/ mashing/gumming of increased textured foods d/t Edentulous status -- pt stated it felt "uncomfortable" when swallowing the trials of moistened graham crackers mashed and dry coughing followed. No further more solid trials were attempted d/t risk for RETROGRADE activity occurring. Oral clearing achieved w/ all trial consistencies.  OM Exam appeared [facility] w/ no unilateral weakness noted. Speech Clear. Pt fed self w/ setup support.   Recommend a FULL Liquids diet w/ loose purees and thin liquids w/ well-moistened foods; Thin liquids -- less straw use to reduce air swallowed. ANY upgrade to more solid foods per recommendation of the GI d/t the Chronic Esophageal phase Dysmotility. Recommend general aspiration precautions, reduce distractions and talking during meals. Rest Breaks during oral intake to allow for Esophageal clearing. STRICT REFLUX PRECAUTIONS W/ ALL ORAL INTAKE. Pills CRUSHED in Puree for safer, easier swallowing and Esophageal Clearing. This was encourged now and for D/C to the pt. NSG.   FULL Education given on Pills CRUSHED in Puree; food consistencies and options; general aspiration precautions and STRICT REFLUX PRECAUTIONS to pt and NSG; handouts.   RECOMMEND F/U W/ GI FOR ALL FURTHER EDUCATION AND RECOMMENDATIONS RE: DIET. No further  skilled ST services indicated as this appears to be Esophageal phase Dysphagia in nature. NSG and MD updated, agreed. Recommend Palliative Care and Dietician f/u for support. SLP Visit Diagnosis: Dysphagia, unspecified (R13.10) (ESOPHAGEAL PHASE DYSMOTILITY)    Aspiration Risk   (risk for aspiration of REFLUX material)    Diet Recommendation   a FULL Liquids diet w/ loose purees and thin liquids w/ well-moistened foods; Thin liquids -- less straw use to reduce air swallowed. ANY upgrade to more solid foods per recommendation of the GI d/t  the Chronic Esophageal phase Dysmotility. Recommend general aspiration precautions, reduce distractions and talking during meals. Rest Breaks during oral intake to allow for Esophageal clearing. STRICT REFLUX PRECAUTIONS W/ ALL ORAL INTAKE.  Medication Administration: Crushed with puree (for ease of Clearing the Esophagus)    Other  Recommendations Recommended Consults: Consider GI evaluation;Consider esophageal assessment (Palliative Care consult for GOC; Dietician f/u) Oral Care Recommendations: Oral care BID;Oral care before and after PO;Patient independent with oral care (setup)    Recommendations for follow up therapy are one component of a multi-disciplinary discharge planning process, led by the attending physician.  Recommendations may be updated based on patient status, additional functional criteria and insurance authorization.  Follow up Recommendations No SLP follow up      Assistance Recommended at Discharge  Intermittent-prn  Functional Status Assessment Patient has had a recent decline in their functional status and/or demonstrates limited ability to make significant improvements in function in a reasonable and predictable amount of time  Frequency and Duration  (n/a)   (n/a)       Prognosis Prognosis for improved oropharyngeal function: Guarded (d/t Esophageal phase Dysmotility) Barriers to Reach Goals: Time post onset;Severity of deficits;Behavior Barriers/Prognosis Comment: risk for aspiration of REFLUX material from SEVERE ESOPHAGEAL PHASE DYSMOTILITY      Swallow Study   General Date of Onset: 10/03/22 HPI: Pt is a 62 y.o. 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  Patient generally is able to walk around without much shortness of breath.  For the last 2 days patient  reports a new cough which is wet however there is no actual expectoration associated with sensation of shortness of breath whenever the patient walks on level ground.  Has not associated fever.  Patient has been having chills.  No new chest pain except for when the patient coughs.  Please Note: patient has chronic chest pain with coughing and with what seems like a Herniation of Stomach into the anterior upper abdomen-chest area.  Patient came to the ER because of sensation of shortness of breath.     Patient was having wheezing on exam at presentation.  Patient has been treated with intravenous steroids, inhaled bronchodilators as well as ceftriaxone and azithromycin.  Patient remains on room air.   PER CHART GI NOTES FROM [facility] 07/2016: "recent EGD on 07/28/16 with Dr. Gayla Medicus didn't help her swallowing. She stated that she has increased heartburn, can only swallow liquids still, and is having back/neck/shoulder pain. Advised her per Dr. Jessee Avers, that he does not want her to have another EGD, but that he does want to follow-up with her in clinic. Advised her that the earliest appointment he has available is 09/06/16 at 09:00 am, but that we can add her to the cancellation list. She was agreeable to advisement/appointment given.  She stated that she is still having pain with difficulty swallowing, but can swallow liquids. Advised her to go to her ER if her swallowing becomes worse.".  Chest CT: Lungs/Pleura: Diffuse bilateral bronchial wall thickening. Dependent  bibasilar consolidation and or atelectasis, increased in the left  lung base compared to prior examination, diminished in the right  lung base. No pleural effusion. Type of Study: Bedside Swallow Evaluation Previous Swallow Assessment: none Diet Prior to this Study: NPO Temperature Spikes Noted: No (wbc 4.8) Respiratory Status: Room air History of Recent Intubation: No Behavior/Cognition: Alert;Cooperative;Pleasant mood;Distractible;Requires cueing Oral  Cavity Assessment: Within Functional Limits Oral Care Completed by SLP: Yes Oral Cavity - Dentition: Edentulous Vision: Functional for self-feeding Self-Feeding Abilities: Able to feed self;Needs set up Patient Positioning: Upright in bed (needed sitting up support) Baseline Vocal Quality: Normal Volitional Cough: Strong (min phlegm) Volitional Swallow: Able to elicit    Oral/Motor/Sensory Function Overall Oral Motor/Sensory Function: Within functional limits   Ice Chips Ice chips: Within functional limits Presentation: Spoon;Self Fed (1-2 ice chips; 10+)   Thin Liquid Thin Liquid: Within functional limits Presentation: Cup;Self Fed (10)    Nectar Thick Nectar Thick Liquid: Not tested   Honey Thick Honey Thick Liquid: Not tested   Puree Puree: Within functional limits Presentation: Spoon;Self Fed (6 trials) Other Comments: breaks b/t trials to allow for Esophageal clearing   Solid     Solid: Impaired Presentation: Self Fed (2 trials) Oral Phase Impairments:  (Edentulous) Pharyngeal Phase Impairments:  (none) Other Comments: felt Esophageal phase Discomfort post trials        Jerilynn Som, MS, CCC-SLP Speech Language Pathologist Rehab Services; [facility] [place] [phone] (ascom) Tamia Dial 10/04/2022,5:02 PM

## 2022-10-04 NOTE — ED Notes (Signed)
Placed pt on bedpan, provided peanut butter and crackers

## 2022-10-04 NOTE — ED Notes (Signed)
Pt. To ED rm. 37. Pt. Transferred herself from stretcher to bed. Pt. Reconnected to cardiac monitor, repositioned for comfort and brief placed. Pt. Understands to call for RN when she needs to get up to use bedside commode. Pt. Denies any further need at this time.

## 2022-10-04 NOTE — ED Notes (Signed)
Pt placed on bedpan

## 2022-10-05 DIAGNOSIS — J189 Pneumonia, unspecified organism: Secondary | ICD-10-CM | POA: Diagnosis not present

## 2022-10-05 LAB — CULTURE, BLOOD (ROUTINE X 2)

## 2022-10-05 MED ORDER — ADULT MULTIVITAMIN W/MINERALS CH
1.0000 | ORAL_TABLET | Freq: Every day | ORAL | Status: DC
  Administered 2022-10-06: 1 via ORAL
  Filled 2022-10-05: qty 1

## 2022-10-05 NOTE — Plan of Care (Signed)

## 2022-10-05 NOTE — Progress Notes (Signed)
Progress Note   Patient: Meghan Jimenez:[id number] DOB: 02/08/1961 DOA: 10/03/2022     1 DOS: the patient was seen and examined on 10/05/2022   Brief hospital course: ANNALENA BRENNON is a 62 y.o. female with medical history significant of COPD, not on home oxygen, cirrhosis, due to hepatitis C, prior MI s/p open heart surgery, with sternotomy.  Patient was in her usual state of health till about 2 days ago.  Patient generally is able to walk around without any shortness of breath.  For the last 2 days patient reports a new cough which is wet however there is no actual expectoration associated with sensation of shortness of breath whenever the patient walks on level ground.  His not associated with any fever.  Patient has been having chills.  No new chest pain except for when the patient coughs.  Please note patient has chronic chest pain with coughing and with what seems like a herniation of stomach into the anterior upper abdomen versus chest area.  No leg swelling or cramping.  Patient came to the ER because of sensation of shortness of breath.   Patient was felt to be in mild to moderate respiratory distress as per signout.  Patient was having wheezing on exam at presentation.  Patient has been treated with intravenous steroids, inhaled bronchodilators as well as ceftriaxone and azithromycin.  Medical evaluation is sought.  Patient remains on room air     Assessment and Plan:  * Pneumonia Concern for possible aspiration pneumonia Patient with a history of esophageal dysphagia and per patient has had dilatation in the past Patient had an upper endoscopy which showed abnormal motility in the upper third of the esophagus as well as in the lower third.  She received Botox injection and had dilatation of the esophageal sphincter to 20 mm.  Despite the procedure patient continued to have dysphagia.  Patient was lost to follow-up Continues to have worsening dysphagia with solids but tolerating  liquids Seen and evaluated by speech therapy, started on a full liquid diet Appreciate GI input, unable to perform endoscopic procedure due to respiratory failure and acute pneumonia.  Continue aspiration precautions Continue antibiotic therapy with Rocephin and Zithromax Follow-up results of blood and sputum culture     COPD exacerbation (HCC) Patient received a dose of IV Solu-Medrol in the ER Continue scheduled and as needed bronchodilator therapy Continue inhaled steroids   Epigastric hernia Will need routine surgery input from [facility] cardiothoracic surgeon. In close proximity to sternotomy   History of cocaine abuse [facility]) Patient used cocaine just yesterday. Has not been using coreg at home . Hold coreg. Resume once safe.   Chronic microcytic anemia Further evaluation by GI once stable     Subjective: Patient is seen and examined at the bedside.  Physical Exam: Vitals:   10/05/22 0037 10/05/22 0500 10/05/22 0728 10/05/22 0801  BP: (!) 142/73   (!) 147/64  Pulse: 74   60  Resp: 20   18  Temp: 98 F (36.7 C)   97.8 F (36.6 C)  TempSrc:      SpO2: 96%  92% 97%  Weight:  62.9 kg    Height:      General: Patient does not appear to be in distress, on room air, alert awake, giving coherent account Respiratory exam: Bilateral posterior basilar crackles no expiratory wheezes heard Cardiovascular exam: Patient has a sternotomy with appreciable gap between the 2 hemisternum.  When patient coughs there seems to be  a swelling that develops in the infra sternal versus epigastric region nonpulsatile.  Reducible.  However patient reports tenderness.  This has been going on for months if not years per patient.  S1-S2 normal Abdomen soft nontender Extremities warm without edema    Data Reviewed:  There are no new results to review at this time.  Family Communication: Plan of care discussed with patient  Disposition: Status is: Inpatient Remains inpatient appropriate because:  IV antibiotics  Planned Discharge Destination: Home    Time spent: 33 minutes  Author: Lucile Shutters, MD 10/05/2022 12:45 PM  For on call review [email or web address].

## 2022-10-05 NOTE — Discharge Instructions (Addendum)
Intensive Outpatient Programs   [facility] [address] [place], [place] [phone]  [facility] (Inpatient and outpatient)[phone] (Suboxone and Methadone) [address] [phone]  [facility] - Intensive Outpatient [address] [place], [place] [address], [place]  [phone]  [facility] (Outpatient, Inpatient, [facility] (Groups and Residental) (insurance only) [phone], [place] [phone]   [facility] (for caregivers and family) [address] [place], [place], [place] [phone]  Residential Treatment Programs  [facility](2 years) Residential: 90 days)[facility] [address] [place], [place], [place] [phone] or [phone]  [facility] [address] [place], [place], [place] [phone]  [facility] [address] [place], [place] [address], [place] [phone] Admissions: 8am-3pm M-F  [facility] Program: Residential Program (90 Days)             [facility]: [facility]  [place], [place], [place]  [phone] or [phone](Walk in Hours over the weekend or by referral)   Mobil Crisis: [facility]:[phone] (for crisis response 24 hours a day)   Rent/Utility/Housing  Agency Name: [facility] Address: [address] [place], [place] [address] Phone: [phone] Email:  [email or web address] Website: www.alamanceservices.org Service(s) Offered: Housing services, self-sufficiency, congregate meal  program, weatherization program, heating appliance  repair/replacement program, emergency food assistance,  housing counseling, home ownership program, wheels -towork program.  Agency Name: [facility] Address: [address], [place], [place] [address] Phone: [phone] (8a-4p) [phone] (8p- 10p) Email: [email or web address] Website: [email or web address].org Service(s) Offered: A program for homeless and/or needy men that includes one-on-one counseling, life skills training and job rehabilitation.  Agency Name: [facility] Address: [address], [place], [place] [address] Phone: [phone] Website: www.alliedchurches.org Service(s) Offered: Assistance to needy in emergency with utility bills, heating  fuel, and prescriptions. Shelter for homeless 7pm-7am. August 24, 2016 15  Agency Name: [facility] Address: [address], [place], [place] [address] Phone: [phone] Contact Person: Wayne Dawson Email: [email or web address] Website: www.arcnc.org Service(s) Offered: Helps individuals with developmental disabilities move  from housing that is more restrictive to homes where they  can achieve greater independence and have more  opportunities.  Agency Name: [facility] Address: [address], [place], [place] [address] Phone: [phone] Email: [email or web address] Website: www.bur[place]housingauthority.org Service(s) Offered: Provides affordable housing for low-income families,  elderly, and disabled individuals. Offer a wide range of  programs and services, from financial planning to afterschool and summer programs.  Agency Name: [facility] Address: [address] [place], [place], [place] [address] Phone:  [phone] Service(s) Offered: Child support services; child welfare services; food stamps;  Medicaid; work first family assistance; and aid with fuel,  rent, food and medicine.  Agency Name: [facility] Address: [facility] [place], [place]  [address] Phone: [phone] Website: www.familyabuseservices.org Service(s) Offered: 24 hour Crisis Line: 226-5985; 24 hour Emergency Shelter;  Transitional Housing; Support Groups; Court Advocacy;  Community Education; Hispanic Outreach: [phone];  Visitation Center: [phone]. August 24, 2016 16  Agency Name: [facility] Address: [address] [place],  [address] Phone:   [phone] Service(s) Offered: CAP Services; Home and Community Supports; Individual  or Group Supports; Respite Care Non-Institutional Nursing;  Residential Supports; Respite Care and Personal Care  Services; Transportation; Family and Friends Night;  Recreational Activities; Three Nutritious Meals/Snacks;  Consultation with Registered Dietician; Twenty-four hour  Registered Nurse Access; Daily and Community Living  Skills; Camp [employer]aves; Summer Camp for the Special  Population (During Summer Months) Bingo Night (Every  Wednesday Night); Special Populations Dance Night  (Every Tuesday Night); Professional Hair Care Services.  Agency Name: [facility] Address: [address], [place], [place] [address] Phone: [phone] Contact Person: Gloria McCauley Website: [email or web address] Service(s) Offered: Residential treatment facility for women; food and  clothing, educational & employment development and  transportation to work; development of financial skills;  parenting and family reunification; emotional and spiritual  support; transitional housing for program graduates.  Agency Name: [facility] Address: [address], [place], [place] [address] Phone: [phone] Email: [email or web address] Website: [email or web address] Service(s) Offered: Public housing units for elderly, disabled, and low income  people; housing choice vouchers for income eligible  applicants; shelter plus care vouchers; and HOPWA  voucher program. August 24, 2016 17  Agency Name: [facility] Address: [address], [place], [place] [address] Phone: [phone] Email: [email or web address]@[email or web address] Website: www.habitatalamance.org Service(s) Offered: Build houses for families in need of decent housing. Each  adult in the family must invest 200 hours of labor on  someone else's house, work with volunteers to build their  own house, attend classes on budgeting, home maintenance, yard care, and attend homeowner association  meetings.  Agency Name: [facility] Address: [address], [place], [place] [address] Phone: [phone] Website: www.rsli.org Service(s) Offered: Intermediate care facilities for mentally retarded,  Supervised Living in group homes for adults with  developmental disabilities, Supervised Living for people  who have dual diagnoses (MRMI), Independent Living,  Supported Living, respite and a variety of CAP services,  pre-vocational services, day supports, and Community  Support Services.  Agency Name: [place] [employer] Phone: [phone] Website: www.NCForeclosurePrevention.gov Service(s) Offered: Zero-interest, deferred loans to homeowners struggling to  pay their mortgage. Call for more information Agency Name: [facility] Address: [address], [place], [place] [address] Phone: [phone] Website: www.alamanceservices.org Service(s) Offered: Housing services, self-sufficiency, congregate meal  program, and individual development account program.  Agency Name: [facility] Address: [address], [place], [place] [address] Phone: [phone] Email: info@alliedchurches.org Website: www.alliedchurches.org Service(s) Offered: Housing the homeless, feeding the hungry, community  kitchen & food pantry, job and education related services.  Agency Name: [facility] Address: [address], [place], [place] [address] Phone: [phone] Email: [email or web address] Service(s) Offered: Counseling, problem pregnancy, advocacy for Hispanics,  limited emergency financial assistance.  Agency Name: [facility] Address: [address], [place], [place] [address] Phone: [phone] Website: [email or web address].[email or web address]/dss Service(s) Offered: Child support services; child welfare services; SNAPS;  Medicaid; work first family assistance; and aid with fuel,  rent, food and medicine.  Agency Name: [facility] Address: [address], [place], [place] [address] Phone: [phone] or [phone] Email: [email or web address] Service(s) Offered: Family services and transient assistance; emergency food,  fuel, clothing, limited furniture, utilities; budget counseling,  general counseling; give a kid a coat; thrift store; Christmas  food and toys. Utility assistance, food pantry, rental  assistance, life sustaining medicine Food Resources  Agency Name: [facility] Address: [address],   [place], [place] [address] Phone: [phone] Website: www.alamanceservices.org  Service(s) Offered: Housing services, self-sufficiency, congregate meal  program, weatherization program, heating appliance  repair/replacement program, emergency food assistance,  housing counseling, home ownership program, wheels -towork program. Meals free for 60 and older at various  locations from 9am-1pm, Monday-Friday:  [facility], [address] [place], [phone] [place]   [facility], [address] [place]  [phone]   [facility], [address]   [place] [phone] [facility], [address]   [place], [phone]  Agency Name: [facility] Address: [address], [place], [place] [address] Phone: [phone] Website: www.alamancemow.org Service(s) Offered: Home delivered hot, frozen, and emergency  meals. Grocery assistance program which matches  volunteers one-on-one with seniors unable to grocery shop  for themselves. Must be 60 years and older; less than 20  hours of in-home aide service, limited or no driving ability;  live alone or with someone with a disability; live in  [place].  Agency Name: [facility] Address: [address] [place], [place] [address] Phone: [phone] Service(s) Offered: Food is served to shut-ins, homeless, elderly, and low  income people in the community every Saturday (11:30  am-12:30 pm) and Sunday (12:30 pm-1:30pm). Volunteers  also offer help and encouragement in seeking employment,  and spiritual guidance. August 24, 2016 8  Agency Name: [facility] Address: [address], [place], [place] [address] Phone: [phone] Service(s) Offered: Child support services; child welfare services; food stamps;  Medicaid; work first family assistance; and aid with fuel,  rent, food and medicine.  Agency Name: [facility] Address: [address] [place], [place] Phone: [phone] Website: www.dreamalign.com Services Offered: Monday 10:00am-12:00, 8:00pm-9:00pm, and Friday  10:00am-12:00. Agency Name: [facility] Address: [address], [place], [place] [address] Phone: [phone] Website: www.alliedchurches.org Service(s) Offered: Serves weekday meals, open from 11:30 am- 1:00 pm., and  6:30-7:30pm, Monday-Wednesday-Friday distributes food  3:30-6pm,  Monday-Wednesday-Friday.  Agency Name: [facility] Address: [address], [place], [place] Phone: [phone] Website: www.[email or web address].org Services Offered: Distributes food the 4th Saturday of the month, starting at  8:00 am Agency Name: [facility] Address: [address], [place], [place] [address] Phone: [phone] Website: [email or web address] Service(s) Offered: [employer] of life, weekly food pantry. Open Wednesdays from  10:00am-noon.  Agency Name: [facility] Address: [address] [place] [address], [place], [place] Phone: [phone] Services Offered: Distributes food 9am-1pm, Monday-Thursday. Call for details.   Agency Name: [facility] Address: [address] [place], [place] [address] Phone: [phone] Website: firstbaptistbur[facility].com Service(s) Offered: Emergency Food Pantry. Call for assistance. Agency Name: [facility] Address: [address], [place], [place] [address] Phone: [phone] Service Offered: Emergency Food Pantry. Call for appointment.  Agency Name: [facility] Address: [address] [place], [place] [address] Phone: [phone] Website: [email or web address].com Services Offered: Emergency Food Pantry. Call for details Agency Name: [facility] Address: [address] [place], [place] Phone: [phone] Website: [email or web address] Service(s) Offered: Emergency Food Pantry. Call for details.  Agency Name: [facility] Address: [address], [place], [place] [address] Phone: [phone] or [phone] Website: [email or web address].[email or web address]/[email or web address] Service(s) Offered: Distribute food 9am-11:30 am, Tuesday-Friday, and 1- 3:30pm, Monday-Friday. Food pantry Monday-Friday  1pm-3pm, fresh items, Mon.-Wed.-Fri.  Agency Name: [facility] Address: [address] [place], [place] [address] Phone:  [phone] Website: www.safealamance.org Services Offered: Distribute   food Tues and Sats from 9:00am-noon. Closed  1st Saturday of each month. Call for details  

## 2022-10-06 DIAGNOSIS — J189 Pneumonia, unspecified organism: Secondary | ICD-10-CM | POA: Diagnosis not present

## 2022-10-06 LAB — CBC
HCT: 32.4 % — ABNORMAL LOW (ref 36.0–46.0)
Hemoglobin: 10 g/dL — ABNORMAL LOW (ref 12.0–15.0)
MCH: 21.3 pg — ABNORMAL LOW (ref 26.0–34.0)
MCHC: 30.9 g/dL (ref 30.0–36.0)
MCV: 69.1 fL — ABNORMAL LOW (ref 80.0–100.0)
Platelets: 170 10*3/uL (ref 150–400)
RBC: 4.69 MIL/uL (ref 3.87–5.11)
RDW: 19.9 % — ABNORMAL HIGH (ref 11.5–15.5)
WBC: 4.8 10*3/uL (ref 4.0–10.5)
nRBC: 0 % (ref 0.0–0.2)

## 2022-10-06 LAB — BASIC METABOLIC PANEL
Anion gap: 8 (ref 5–15)
BUN: 13 mg/dL (ref 8–23)
CO2: 28 mmol/L (ref 22–32)
Calcium: 9.2 mg/dL (ref 8.9–10.3)
Chloride: 101 mmol/L (ref 98–111)
Creatinine, Ser: 0.56 mg/dL (ref 0.44–1.00)
GFR, Estimated: >60 mL/min (ref >60)
Glucose, Bld: 108 mg/dL — ABNORMAL HIGH (ref 70–99)
Potassium: 3.9 mmol/L (ref 3.5–5.1)
Sodium: 137 mmol/L (ref 135–145)

## 2022-10-06 LAB — CULTURE, BLOOD (ROUTINE X 2): Culture: NO GROWTH

## 2022-10-06 MED ORDER — ENSURE ENLIVE PO LIQD: 237.0000 mL | Freq: Three times a day (TID) | ORAL | 12 refills | Status: AC

## 2022-10-06 MED ORDER — GUAIFENESIN-DM 100-10 MG/5ML PO SYRP
5.0000 mL | ORAL_SOLUTION | ORAL | Status: DC | PRN
  Administered 2022-10-06: 5 mL via ORAL
  Filled 2022-10-06: qty 10

## 2022-10-06 MED ORDER — AMOXICILLIN-POT CLAVULANATE 875-125 MG PO TABS: 1.0000 | ORAL_TABLET | Freq: Two times a day (BID) | ORAL | 0 refills | Status: AC

## 2022-10-06 NOTE — Care Management Important Message (Signed)
Important Message  Patient Details  Name: Meghan Jimenez MRN: [id number] Date of Birth: Sep 29, 1960   Medicare Important Message Given:  N/A - LOS <3 / Initial given by admissions     Olegario Messier A Verle Brillhart 10/06/2022, 2:20 PM

## 2022-10-06 NOTE — Progress Notes (Signed)
Progress Note   Patient: Meghan Jimenez NWG:[id number] DOB: 04-Feb-1961 DOA: 10/03/2022     2 DOS: the patient was seen and examined on 10/06/2022   Brief hospital course:  [name] is a 62 y.o. female with medical history significant of COPD, not on home oxygen, cirrhosis, due to hepatitis C, prior MI s/p open heart surgery, with sternotomy.  Patient was in her usual state of health till about 2 days ago.  Patient generally is able to walk around without any shortness of breath.  For the last 2 days patient reports a new cough which is wet however there is no actual expectoration associated with sensation of shortness of breath whenever the patient walks on level ground.  His not associated with any fever.  Patient has been having chills.  No new chest pain except for when the patient coughs.  Please note patient has chronic chest pain with coughing and with what seems like a herniation of stomach into the anterior upper abdomen versus chest area.  No leg swelling or cramping.  Patient came to the ER because of sensation of shortness of breath.   Patient was felt to be in mild to moderate respiratory distress as per signout.  Patient was having wheezing on exam at presentation.  Patient has been treated with intravenous steroids, inhaled bronchodilators as well as ceftriaxone and azithromycin.  Medical evaluation is sought.  Patient remains on room air   Assessment and Plan:  * Pneumonia Concern for possible aspiration pneumonia Patient with a history of esophageal dysphagia and per patient has had dilatation in the past Patient had an upper endoscopy which showed abnormal motility in the upper third of the esophagus as well as in the lower third.  She received Botox injection and had dilatation of the esophageal sphincter to 20 mm.  Despite the procedure patient continued to have dysphagia.  Patient was lost to follow-up Continues to have worsening dysphagia with solids but tolerating  liquids Seen and evaluated by speech therapy, started on a full liquid diet Appreciate GI input, unable to perform endoscopic procedure due to respiratory failure and acute pneumonia.  Continue aspiration precautions Continue antibiotic therapy with Rocephin and Zithromax Follow-up results of blood and sputum culture Reconsult speech therapy to see if patient's diet can be advanced     COPD exacerbation (HCC) Patient received a dose of IV Solu-Medrol in the ER Continue scheduled and as needed bronchodilator therapy Continue inhaled steroids   Epigastric hernia Will need routine surgery input from [facility] cardiothoracic surgeon. In close proximity to sternotomy   History of cocaine abuse [facility]) Patient used cocaine just yesterday. Has not been using coreg at home . Hold coreg. Resume once safe.     Chronic microcytic anemia Further evaluation by GI once stable     Subjective: Patient is seen and examined at the bedside.  Requesting for her diet to be advanced.  Physical Exam: Vitals:   10/05/22 1531 10/05/22 1935 10/05/22 2318 10/06/22 0907  BP: (!) 141/67  (!) 140/74 133/70  Pulse: 74  73 81  Resp: 17  17 16   Temp: 98.2 F (36.8 C)  98.2 F (36.8 C) 98 F (36.7 C)  TempSrc: Oral   Oral  SpO2: 94% 90% 94% 95%  Weight:      Height:       General: Patient does not appear to be in distress, on room air, alert awake, giving coherent account Respiratory exam: Bilateral posterior basilar crackles no expiratory  wheezes heard Cardiovascular exam: Patient has a sternotomy with appreciable gap between the 2 hemisternum.  When patient coughs there seems to be a swelling that develops in the infra sternal versus epigastric region nonpulsatile.  Reducible.  However patient reports tenderness.  This has been going on for months if not years per patient.  S1-S2 normal Abdomen soft nontender Extremities warm without edema Data Reviewed:  There are no new results to review at this  time.  Family Communication: Discussed plan of care with patient at the bedside she verbalizes understanding and agrees with the plan.  Disposition: Status is: Inpatient Remains inpatient appropriate because: Needs evaluation by speech therapy prior to discharge  Planned Discharge Destination: Home    Time spent: 30 minutes  Author: Lucile Shutters, MD 10/06/2022 11:58 AM  For on call review [email or web address].

## 2022-10-06 NOTE — Discharge Summary (Signed)
Physician Discharge Summary   Patient: Meghan Jimenez MRN: [id number] DOB: 02/04/1961  Admit date:     10/03/2022  Discharge date: 10/06/22  Discharge Physician: Nancee Brownrigg   PCP: [facility] [employer]   Recommendations at discharge:   Follow-up with gastroenterology at [facility] as an outpatient to schedule EGD  Discharge Diagnoses: Principal Problem:   Pneumonia Active Problems:   COPD exacerbation (HCC)   History of cocaine abuse (HCC)   CAP (community acquired pneumonia)   Epigastric hernia  Resolved Problems:   * No resolved hospital problems. *  Hospital Course: Meghan Jimenez is a 62 y.o. female with medical history significant of COPD, not on home oxygen, cirrhosis, due to hepatitis C, prior MI s/p open heart surgery, with sternotomy.  Patient was in her usual state of health till about 2 days ago.  Patient generally is able to walk around without any shortness of breath.  For the last 2 days patient reports a new cough which is wet however there is no actual expectoration associated with sensation of shortness of breath whenever the patient walks on level ground.  His not associated with any fever.  Patient has been having chills.  No new chest pain except for when the patient coughs.  Please note patient has chronic chest pain with coughing and with what seems like a herniation of stomach into the anterior upper abdomen versus chest area.  No leg swelling or cramping.  Patient came to the ER because of sensation of shortness of breath.   Patient was felt to be in mild to moderate respiratory distress as per signout.  Patient was having wheezing on exam at presentation.  Patient has been treated with intravenous steroids, inhaled bronchodilators as well as ceftriaxone and azithromycin.  Medical evaluation is sought.  Patient remains on room air   Assessment and Plan: * Pneumonia Concern for possible aspiration pneumonia Patient with a history of esophageal  dysphagia and per patient has had dilatation in the past Patient had an upper endoscopy in 2018 which showed abnormal motility in the upper third of the esophagus as well as in the lower third.  She received Botox injection and had dilatation of the esophageal sphincter to 20 mm.  Despite the procedure patient continued to have dysphagia.  Patient was lost to follow-up Continues to have worsening dysphagia with solids but tolerating liquids Seen and evaluated by speech therapy, started on a full liquid diet Appreciate GI input, unable to perform endoscopic procedure due to respiratory failure and acute pneumonia.  Continue aspiration precautions Patient was treated empirically with Rocephin and Zithromax and will be discharged home on a course of Augmentin. Advised to follow-up with [facility] GI upon discharge for further evaluation needs to remain on a full liquid diet until seen. This was discussed with patient and her son over the phone and they verbalized understanding and agree with the plan.     COPD exacerbation (HCC) Patient received a dose of IV Solu-Medrol in the ER Continue as needed bronchodilator therapy as well as inhaled steroid     Epigastric hernia Will need routine surgery input from [facility] cardiothoracic surgeon. In close proximity to sternotomy   History of cocaine abuse [facility]) Patient counseled on the need to abstain from illicit drug use     Chronic microcytic anemia Further evaluation by GI once stable          Consultants: Gastroenterology Procedures performed: None Disposition: Home Diet recommendation:  Discharge Diet Orders (From  admission, onward)     Start     Ordered   10/06/22 0000  Diet - low sodium heart healthy        10/06/22 1325   10/06/22 0000  Diet full liquid        10/06/22 1325           Full liquid diet DISCHARGE MEDICATION: Allergies as of 10/06/2022       Reactions   Aspirin Anaphylaxis, Palpitations   Patient takes ibuprofen as  home med, so no problem with NSAIDs Pt took aspirin today and no reaction at all 10/03/22   Tramadol Anaphylaxis   Acetaminophen    Patient reports she is supposed to limit intake due to liver issues   Latex    Codeine Rash   Vicodin [hydrocodone-acetaminophen] Palpitations        Medication List     STOP taking these medications    amLODipine 10 MG tablet Commonly known as: NORVASC       TAKE these medications    albuterol 108 (90 Base) MCG/ACT inhaler Commonly known as: VENTOLIN HFA Inhale 1-2 puffs into the lungs every 4 (four) hours as needed for wheezing or shortness of breath.   amoxicillin-clavulanate 875-125 MG tablet Commonly known as: AUGMENTIN Take 1 tablet by mouth 2 (two) times daily for 5 days.   carvedilol 3.125 MG tablet Commonly known as: COREG Take 3.125 mg by mouth 2 (two) times daily.   diphenhydrAMINE 50 MG capsule Commonly known as: BENADRYL Take 50 mg by mouth every 6 (six) hours as needed.   feeding supplement Liqd Take 237 mLs by mouth 3 (three) times daily between meals.   furosemide 20 MG tablet Commonly known as: LASIX Take 20 mg by mouth daily as needed for edema or fluid.   hydrOXYzine 25 MG tablet Commonly known as: ATARAX Take 25 mg by mouth 3 (three) times daily.   Iron 325 (65 Fe) MG Tabs Take 1 tablet by mouth once a week.   Nitrostat 0.4 MG SL tablet Generic drug: nitroGLYCERIN Place 0.4 mg under the tongue every 5 (five) minutes as needed for chest pain.   pantoprazole 40 MG tablet Commonly known as: PROTONIX Take 40 mg by mouth 2 (two) times daily before a meal.   pregabalin 300 MG capsule Commonly known as: LYRICA Take 300 mg by mouth 2 (two) times daily.   Spiriva HandiHaler 18 MCG inhalation capsule Generic drug: tiotropium Place 1 capsule into inhaler and inhale daily.   spironolactone 50 MG tablet Commonly known as: ALDACTONE Take 50 mg by mouth daily as needed.   venlafaxine XR 150 MG 24 hr  capsule Commonly known as: EFFEXOR-XR Take 150 mg by mouth daily.        Discharge Exam: General: Patient does not appear to be in distress, on room air, alert awake, Respiratory exam: Bilateral posterior basilar crackles, scattered expiratory wheezes heard Cardiovascular exam: Patient has a sternotomy with appreciable gap between the 2 hemisternum.  When patient coughs there seems to be a swelling that develops in the infra sternal versus epigastric region nonpulsatile.  Reducible.  However patient reports tenderness.  This has been going on for months if not years per patient.  S1-S2 normal Abdomen soft nontender Extremities warm without edema   Condition at discharge: stable  The results of significant diagnostics from this hospitalization (including imaging, microbiology, ancillary and laboratory) are listed below for reference.   Imaging Studies: CT Angio Chest/Abd/Pel for Dissection W and/or Wo  Contrast  Result Date: 10/03/2022 CLINICAL DATA:  Chest pain radiating to back, shortness of breath acute aortic syndrome suspected EXAM: CT ANGIOGRAPHY CHEST, ABDOMEN AND PELVIS TECHNIQUE: Non-contrast CT of the chest was initially obtained. Multidetector CT imaging through the chest, abdomen and pelvis was performed using the standard protocol during bolus administration of intravenous contrast. Multiplanar reconstructed images and MIPs were obtained and reviewed to evaluate the vascular anatomy. RADIATION DOSE REDUCTION: This exam was performed according to the departmental dose-optimization program which includes automated exposure control, adjustment of the mA and/or kV according to patient size and/or use of iterative reconstruction technique. CONTRAST:  OMNIPAQUE IOHEXOL 350 MG/ML SOLN COMPARISON:  CT chest, 08/13/2022 FINDINGS: CTA CHEST FINDINGS VASCULAR Aorta: Satisfactory opacification of the aorta. Normal contour and caliber of the thoracic aorta. No evidence of aneurysm,  dissection, or other acute aortic pathology. Scattered aortic atherosclerosis. Cardiovascular: No evidence of pulmonary embolism on limited non-tailored examination. Normal heart size. Left coronary artery calcifications status post median sternotomy and CABG. No pericardial effusion. Review of the MIP images confirms the above findings. NON VASCULAR Mediastinum/Nodes: No enlarged mediastinal, hilar, or axillary lymph nodes. Thyroid gland, trachea, and esophagus demonstrate no significant findings. Lungs/Pleura: Diffuse bilateral bronchial wall thickening. Dependent bibasilar consolidation and or atelectasis, increased in the left lung base compared to prior examination, diminished in the right lung base (series 5, image 104). No pleural effusion or pneumothorax. 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 No acute osseous findings. Review of the MIP images confirms the above findings. CTA ABDOMEN AND PELVIS FINDINGS VASCULAR Normal contour and caliber of the abdominal aorta. Severe mixed calcific atherosclerosis. No evidence of aneurysm, dissection, or other acute aortic pathology. Standard branching pattern of the abdominal aorta with solitary bilateral renal arteries. Review of the MIP images confirms the above findings. NON-VASCULAR Hepatobiliary: No solid liver abnormality is seen. Coarse, nodular cirrhotic morphology of the liver. Hepatomegaly, maximum coronal span 21.8 cm. Status post cholecystectomy. No biliary ductal dilatation. Pancreas: Unremarkable. No pancreatic ductal dilatation or surrounding inflammatory changes. Spleen: Normal in size without significant abnormality. Adrenals/Urinary Tract: Adrenal glands are unremarkable. Punctuate nonobstructive calculus of the midportion of the left kidney (series 5, image 178). No right-sided calculi, ureteral calculi, or hydronephrosis. Bladder is unremarkable. Stomach/Bowel: Stomach is within normal  limits. Appendix is not clearly visualized. No evidence of bowel wall thickening, distention, or inflammatory changes. Lymphatic: No enlarged abdominal or pelvic lymph nodes. Reproductive: No mass or other significant abnormality. Other: No abdominal wall hernia or abnormality. No ascites. Musculoskeletal: No acute osseous findings. IMPRESSION: 1. Normal contour and caliber of the thoracic and abdominal aorta. No evidence of aneurysm, dissection, or other acute aortic pathology. Severe mixed calcific atherosclerosis primarily of the abdominal aorta. 2. Dependent bibasilar consolidation and or atelectasis, increased in the left lung base compared to prior examination, diminished in the right lung base. Findings are consistent with infection or aspiration, possibly ongoing given fluctuance of findings. 3. Diffuse bilateral bronchial wall thickening, consistent with nonspecific infectious or inflammatory bronchitis. 4. Coronary artery disease. 5. Cirrhosis and hepatomegaly. 6. Nonobstructive left nephrolithiasis. Aortic Atherosclerosis (ICD10-I70.0). Electronically Signed   By: Jearld Lesch M.D.   On: 10/03/2022 19:47   DG Chest 1 View  Result Date: 10/03/2022 CLINICAL DATA:  Chest pain EXAM: CHEST  1 VIEW COMPARISON:  Radiograph and CT 08/13/2022 FINDINGS: Stable cardiomediastinal silhouette. Mediastinal clips. Left basilar atelectasis. The previous right basilar airspace opacity has resolved. No pleural effusion or  pneumothorax. Cervical spine fusion hardware. No displaced rib fractures. Remote left rib fractures. IMPRESSION: No active disease. Electronically Signed   By: Minerva Fester M.D.   On: 10/03/2022 19:06    Microbiology: Results for orders placed or performed during the hospital encounter of 10/03/22  Blood culture (routine x 2)     Status: None (Preliminary result)   Collection Time: 10/03/22  9:19 PM   Specimen: BLOOD  Result Value Ref Range Status   Specimen Description BLOOD BLOOD RIGHT ARM   Final   Special Requests   Final    BOTTLES DRAWN AEROBIC AND ANAEROBIC Blood Culture results may not be optimal due to an excessive volume of blood received in culture bottles   Culture   Final    NO GROWTH 3 DAYS Performed at [facility], [address] [place], [place] [address]    Report Status PENDING  Incomplete  Blood culture (routine x 2)     Status: None (Preliminary result)   Collection Time: 10/03/22  9:19 PM   Specimen: BLOOD  Result Value Ref Range Status   Specimen Description BLOOD BLOOD LEFT ARM  Final   Special Requests   Final    BOTTLES DRAWN AEROBIC AND ANAEROBIC Blood Culture adequate volume   Culture   Final    NO GROWTH 3 DAYS Performed at [facility], [address], [place], [place] [address]    Report Status PENDING  Incomplete  SARS Coronavirus 2 by RT PCR (hospital order, performed in [facility] hospital lab) *cepheid single result test* Anterior Nasal Swab     Status: None   Collection Time: 10/03/22  9:19 PM   Specimen: Anterior Nasal Swab  Result Value Ref Range Status   SARS Coronavirus 2 by RT PCR NEGATIVE NEGATIVE Final    Comment: (NOTE) SARS-CoV-2 target nucleic acids are NOT DETECTED.  The SARS-CoV-2 RNA is generally detectable in upper and lower respiratory specimens during the acute phase of infection. The lowest concentration of SARS-CoV-2 viral copies this assay can detect is 250 copies / mL. A negative result does not preclude SARS-CoV-2 infection and should not be used as the sole basis for treatment or other patient management decisions.  A negative result may occur with improper specimen collection / handling, submission of specimen other than nasopharyngeal swab, presence of viral mutation(s) within the areas targeted by this assay, and inadequate number of viral copies (<250 copies / mL). A negative result must be combined with clinical observations, patient history, and epidemiological information.  Fact  Sheet for Patients:   RoadLapTop.co.za  Fact Sheet for Healthcare Providers: http://kim-miller.com/  This test is not yet approved or  cleared by the [place] FDA and has been authorized for detection and/or diagnosis of SARS-CoV-2 by FDA under an Emergency Use Authorization (EUA).  This EUA will remain in effect (meaning this test can be used) for the duration of the COVID-19 declaration under Section 564(b)(1) of the Act, 21 U.S.C. section 360bbb-3(b)(1), unless the authorization is terminated or revoked sooner.  Performed at [facility], [address] [place], [place] [address]     Labs: CBC: Recent Labs  Lab 10/03/22 1839 10/03/22 2119 10/04/22 0411 10/06/22 0845  WBC 7.8 6.7 4.8 4.8  HGB 10.9* 10.5* 11.0* 10.0*  HCT 34.7* 32.9* 34.6* 32.4*  MCV 67.9* 67.1* 68.2* 69.1*  PLT 150 148* 147* 170   Basic Metabolic Panel: Recent Labs  Lab 10/03/22 1839 10/03/22 2119 10/04/22 0411 10/06/22 0845  NA 134*  --  135 137  K 3.1*  --  3.4* 3.9  CL 101  --  106 101  CO2 24  --  26 28  GLUCOSE 120*  --  210* 108*  BUN 11  --  10 13  CREATININE 0.62 0.54 0.66 0.56  CALCIUM 9.2  --  8.8* 9.2   Liver Function Tests: Recent Labs  Lab 10/03/22 2119  AST 24  ALT 19  ALKPHOS 86  BILITOT 0.8  PROT 7.1  ALBUMIN 3.6   CBG: No results for input(s): "GLUCAP" in the last 168 hours.  Discharge time spent: greater than 30 minutes.  Signed: Lucile Shutters, MD [facility] 10/06/2022

## 2022-10-06 NOTE — TOC Progression Note (Addendum)
Transition of Care [facility]) - Progression Note    Patient Details  Name: Meghan Jimenez MRN: [id number] Date of Birth: February 13, 1961  Transition of [facility]) CM/SW Contact  Lucile Shutters, MD Phone Number: 10/06/2022, 2:59 PM  Clinical Narrative:    Spoke with the patient about her needs, She stated that she lives with her son, but wants to move, I added low income housing resources to her AVS and explained to her where to find it, Also added drug use resources and utilities and food, she stated understanding, she stated that she has Oxygen to use PRN at home but it is at her ex husband's house.  I explained she would not be able to get another company or that company to provide any new Oxygen until that was picked up.  She stated that she will send her son to his house to get it. She stated that she has a Merchant navy officer and drives, she does have a pcp  and gets meds at [facility] No additional needs at this time, TOC to Continue to monitor for needs Patient is requesting a rolling walker, the bedside nurse notified me, I reached out to get orders and will get delivered to the bedside by [employer] once orders are signed  Expected Discharge Plan: Home/Self Care Barriers to Discharge: Continued Medical Work up  Expected Discharge Plan and Services   Discharge Planning Services: CM Consult   Living arrangements for the past 2 months: Single Family Home Expected Discharge Date: 10/06/22               DME Arranged: N/A DME Agency: NA       HH Arranged: NA           Social Determinants of Health (SDOH) Interventions SDOH Screenings   Food Insecurity: Food Insecurity Present (10/04/2022)  Housing: High Risk (10/04/2022)  Transportation Needs: No Transportation Needs (10/04/2022)  Utilities: At Risk (10/04/2022)  Tobacco Use: High Risk (10/03/2022)    Readmission Risk Interventions    10/06/2022   12:02 PM  Readmission Risk Prevention Plan  Transportation Screening Complete  PCP or  Specialist Appt within 3-5 Days Complete  HRI or Home Care Consult Complete  Social Work Consult for Recovery Care Planning/Counseling Complete  Palliative Care Screening Not Applicable  Medication Review [employer]) Referral to Pharmacy

## 2022-10-06 NOTE — Plan of Care (Signed)

## 2022-10-07 LAB — CULTURE, BLOOD (ROUTINE X 2)

## 2022-10-08 LAB — CULTURE, BLOOD (ROUTINE X 2): Special Requests: ADEQUATE

## 2022-10-09 ENCOUNTER — Telehealth: Payer: Self-pay

## 2022-10-09 NOTE — Patient Outreach (Signed)
Received: Hospital Liaison referral from Elliot Cousin, [facility] Treatment Liaison   Unfortunately, our care management team will not be able to assist with this patient. The patient's primary care physician is not attributed to [facility].  Iverson Alamin, Donivan Scull [facility] Care Management Assistant [facility] Care Management [phone]

## 2022-10-09 NOTE — Consult Note (Addendum)
[facility] Customer service manager [facility]) Accountable Care Organization (ACO) [facility] Liaison Note  10/09/2022  JANEISHA RYLE 1960-08-08 [id number]  Location: [facility] RN Hospital Liaison screened the patient remotely at [facility].  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 The patient was screened for readmission hospitalization with noted high risk score for unplanned readmission risk with 2 iP  in 6 months.  The patient was assessed for potential [facility]) Care Management service needs for post hospital transition for care coordination. Review of patient's electronic medical record reveals patient was admitted for pneumonia.Unsuccessful outreach attempt however able to leave a HIPAA voice message requesting a call back. Will make a referral for [facility] RN care coordinator for hospital prevention readmission services,  Plan: [facility] Liaison will continue to follow progress and disposition to asess for post hospital community care coordination/management needs.  Referral request for community care coordination: anticipate [facility] Transitions of Care Team follow up.   [facility] Care Management/Population Health does not replace or interfere with any arrangements made by the Inpatient Transition of Care team.   For questions contact:   4:20 PM Addendum: Informed [facility] will not be able to services this pt via [facility] due networking provider and recent insurance changes.  Elliot Cousin, RN, BSN [facility] Liaison [place]   [facility]  Population Health Office Hours MTWF  8:00 am-6:00 pm Off on Thursday [phone] mobile [phone] [Office toll free line][facility] Office Hours are M-F 8:30 - 5 pm 24 hour nurse advise line [phone] Concierge  [email or web address]

## 2022-10-14 ENCOUNTER — Emergency Department
Admission: EM | Admit: 2022-10-14 | Discharge: 2022-10-15 | Disposition: A | Discharge status: Home / Self Care | Payer: Medicare HMO | Attending: Emergency Medicine | Admitting: Emergency Medicine

## 2022-10-14 ENCOUNTER — Other Ambulatory Visit: Payer: Self-pay

## 2022-10-14 ENCOUNTER — Emergency Department: Payer: Medicare HMO

## 2022-10-14 DIAGNOSIS — Z951 Presence of aortocoronary bypass graft: Secondary | ICD-10-CM | POA: Insufficient documentation

## 2022-10-14 DIAGNOSIS — K229 Disease of esophagus, unspecified: Secondary | ICD-10-CM | POA: Diagnosis not present

## 2022-10-14 DIAGNOSIS — Z8701 Personal history of pneumonia (recurrent): Secondary | ICD-10-CM | POA: Diagnosis not present

## 2022-10-14 DIAGNOSIS — R079 Chest pain, unspecified: Secondary | ICD-10-CM

## 2022-10-14 DIAGNOSIS — R0789 Other chest pain: Secondary | ICD-10-CM

## 2022-10-14 DIAGNOSIS — K224 Dyskinesia of esophagus: Secondary | ICD-10-CM

## 2022-10-14 DIAGNOSIS — R000 Tachycardia, unspecified: Secondary | ICD-10-CM | POA: Insufficient documentation

## 2022-10-14 LAB — BASIC METABOLIC PANEL
Anion gap: 9 (ref 5–15)
BUN: 10 mg/dL (ref 8–23)
CO2: 22 mmol/L (ref 22–32)
Calcium: 9.1 mg/dL (ref 8.9–10.3)
Chloride: 102 mmol/L (ref 98–111)
Creatinine, Ser: 0.6 mg/dL (ref 0.44–1.00)
GFR, Estimated: >60 mL/min (ref >60)
Glucose, Bld: 97 mg/dL (ref 70–99)
Potassium: 4.1 mmol/L (ref 3.5–5.1)
Sodium: 133 mmol/L — ABNORMAL LOW (ref 135–145)

## 2022-10-14 LAB — TROPONIN I (HIGH SENSITIVITY): Troponin I (High Sensitivity): 6 ng/L (ref <18)

## 2022-10-14 LAB — CBC
HCT: 35.9 % — ABNORMAL LOW (ref 36.0–46.0)
Hemoglobin: 11.2 g/dL — ABNORMAL LOW (ref 12.0–15.0)
MCH: 21.1 pg — ABNORMAL LOW (ref 26.0–34.0)
MCHC: 31.2 g/dL (ref 30.0–36.0)
MCV: 67.7 fL — ABNORMAL LOW (ref 80.0–100.0)
Platelets: 214 10*3/uL (ref 150–400)
RBC: 5.3 MIL/uL — ABNORMAL HIGH (ref 3.87–5.11)
RDW: 19.4 % — ABNORMAL HIGH (ref 11.5–15.5)
WBC: 11.9 10*3/uL — ABNORMAL HIGH (ref 4.0–10.5)
nRBC: 0 % (ref 0.0–0.2)

## 2022-10-14 NOTE — ED Notes (Signed)
Attempted to collect UA. Pt placed toilet paper in hat. Will attempt later.

## 2022-10-14 NOTE — ED Notes (Signed)
Lab called to draw bloodwork.

## 2022-10-14 NOTE — ED Provider Notes (Signed)
[facility] Provider Note   Event Date/Time   First MD Initiated Contact with Patient 10/14/22 2349     (approximate) History  Chest Pain  HPI Meghan Jimenez is a 62 y.o. female with extensive past medical history including CABG with sternal removal and large hiatal hernia as well as esophageal dysmotility presents complaining of continued lower bilateral chest pain that is worsened with deep breathing or coughing.  Patient states that she has had similar symptoms to this in the past due to aspiration.  Patient states that she was recently diagnosed with pneumonia and discharged 10 days prior to arrival.  Patient states that she has finished her course of Augmentin ROS: Patient currently denies any vision changes, tinnitus, difficulty speaking, facial droop, sore throat, shortness of breath, abdominal pain, nausea/vomiting/diarrhea, dysuria, or weakness/numbness/paresthesias in any extremity   Physical Exam  Triage Vital Signs: ED Triage Vitals  Enc Vitals Group     BP 10/14/22 2234 (!) 178/97     Pulse Rate 10/14/22 2234 94     Resp 10/14/22 2234 18     Temp 10/14/22 2234 98.6 F (37 C)     Temp Source 10/14/22 2234 Oral     SpO2 10/14/22 2234 94 %     Weight 10/14/22 2238 136 lb 11 oz (62 kg)     Height 10/14/22 2238 5\' 3"  (1.6 m)     Head Circumference --      Peak Flow --      Pain Score 10/14/22 2237 5     Pain Loc --      Pain Edu? --      Excl. in GC? --    Most recent vital signs: Vitals:   10/15/22 0200 10/15/22 0359  BP:  (!) 140/74  Pulse: 92 88  Resp: 19 18  Temp:  98.4 F (36.9 C)  SpO2: 96% 97%   General: Asleep and easily awoken to voice, oriented x4. CV:  Good peripheral perfusion.  Resp:  Normal effort.  Abd:  No distention.  Other:  Delay aged overweight Caucasian female laying in bed in no acute distress.  Midline chest deformity ED Results / Procedures / Treatments  Labs (all labs ordered are listed, but only abnormal  results are displayed) Labs Reviewed  BASIC METABOLIC PANEL - Abnormal; Notable for the following components:      Result Value   Sodium 133 (*)    All other components within normal limits  CBC - Abnormal; Notable for the following components:   WBC 11.9 (*)    RBC 5.30 (*)    Hemoglobin 11.2 (*)    HCT 35.9 (*)    MCV 67.7 (*)    MCH 21.1 (*)    RDW 19.4 (*)    All other components within normal limits  TROPONIN I (HIGH SENSITIVITY)  TROPONIN I (HIGH SENSITIVITY)   EKG ED ECG REPORT I, Merwyn Katos, the attending physician, personally viewed and interpreted this ECG. Date: 10/14/2022 EKG Time: 2243 Rate: 100 Rhythm: Tachycardic sinus rhythm QRS Axis: normal Intervals: normal ST/T Wave abnormalities: normal Narrative Interpretation: Tachycardic sinus rhythm.  No evidence of acute ischemia RADIOLOGY ED MD interpretation: 2 view chest x-ray interpreted independently by me and shows left lung base atelectasis/scarring or residual infiltrate -Agree with radiology assessment Official radiology report(s): DG Chest 2 View  Result Date: 10/14/2022 CLINICAL DATA:  Chest pain. EXAM: CHEST - 2 VIEW COMPARISON:  Chest radiograph dated 10/03/2022. FINDINGS: Left lung  base density may represent atelectasis/scarring or residual infiltrate. There is blunting of the left costophrenic angle which may represent scarring or trace effusion. The right lung is clear. There is no pneumothorax. The cardiac silhouette is within limits. CABG vascular clips. No acute osseous pathology. Degenerative changes of the spine. IMPRESSION: Left lung base atelectasis/scarring or residual infiltrate. Electronically Signed   By: Elgie Collard M.D.   On: 10/14/2022 23:30   PROCEDURES: Critical Care performed: No .1-3 Lead EKG Interpretation  Performed by: Merwyn Katos, MD Authorized by: Merwyn Katos, MD     Interpretation: normal     ECG rate:  71   ECG rate assessment: normal     Rhythm: sinus  rhythm     Ectopy: none     Conduction: normal    MEDICATIONS ORDERED IN ED: Medications  meloxicam (MOBIC) tablet 15 mg (15 mg Oral Given 10/15/22 0316)   IMPRESSION / MDM / ASSESSMENT AND PLAN / ED COURSE  I reviewed the triage vital signs and the nursing notes.                             The patient is on the cardiac monitor to evaluate for evidence of arrhythmia and/or significant heart rate changes. Patient's presentation is most consistent with acute presentation with potential threat to life or bodily function. Workup: ECG, CXR, CBC, BMP, Troponin Findings: ECG: No overt evidence of STEMI. No evidence of Brugadas sign, delta wave, epsilon wave, significantly prolonged QTc, or malignant arrhythmia HS Troponin: Negative x1 Other Labs unremarkable for emergent problems. CXR: Without PTX, PNA, or widened mediastinum HEART Score: 4  Given History, Exam, and Workup I have low suspicion for ACS, Pneumothorax, Pneumonia, Pulmonary Embolus, Tamponade, Aortic Dissection or other emergent problem as a cause for this presentation.   Reassesment: Prior to discharge patients pain was controlled and they were well appearing.  Disposition:  Discharge. Strict return precautions discussed with patient with full understanding. Advised patient to follow up promptly with primary care provider    FINAL CLINICAL IMPRESSION(S) / ED DIAGNOSES   Final diagnoses:  Chest pain, unspecified type  Atypical chest pain  History of recent pneumonia  Esophageal dysfunction   Rx / DC Orders   ED Discharge Orders          Ordered    ketorolac (TORADOL) 10 MG tablet  Every 6 hours PRN       Note to Pharmacy: Patient given an IM/IV loading dose in emergency department   10/15/22 0244           Note:  This document was prepared using Dragon voice recognition software and may include unintentional dictation errors.   Merwyn Katos, MD 10/15/22 [phone]

## 2022-10-14 NOTE — ED Triage Notes (Signed)
Pt to ed from home via POV for CP. Pt was just seen 6/4 for the same and was diagnosed with pneumonia. Pt is caox4, in no acute distress and ambulatory in triage. Pt has pain in her chest when coughing and laughing in triage.

## 2022-10-15 LAB — TROPONIN I (HIGH SENSITIVITY): Troponin I (High Sensitivity): 6 ng/L (ref <18)

## 2022-10-15 MED ORDER — KETOROLAC TROMETHAMINE 10 MG PO TABS: 10.0000 mg | ORAL_TABLET | Freq: Four times a day (QID) | ORAL | 0 refills | Status: AC | PRN

## 2022-10-15 MED ORDER — MELOXICAM 7.5 MG PO TABS
15.0000 mg | ORAL_TABLET | Freq: Once | ORAL | Status: AC
  Administered 2022-10-15: 15 mg via ORAL
  Filled 2022-10-15: qty 2

## 2023-04-05 ENCOUNTER — Telehealth: Payer: Self-pay

## 2023-04-05 NOTE — Telephone Encounter (Signed)
Millie called from the PCP office to check on the patient referral. I inform her that letters has been sent and she will have to call in to schedule.

## 2023-07-07 ENCOUNTER — Other Ambulatory Visit: Payer: Self-pay

## 2023-07-07 ENCOUNTER — Inpatient Hospital Stay

## 2023-07-07 ENCOUNTER — Emergency Department

## 2023-07-07 ENCOUNTER — Observation Stay
Admission: EM | Admit: 2023-07-07 | Discharge: 2023-07-08 | Disposition: A | Discharge status: Home / Self Care | Attending: Internal Medicine | Admitting: Internal Medicine

## 2023-07-07 DIAGNOSIS — E785 Hyperlipidemia, unspecified: Secondary | ICD-10-CM | POA: Diagnosis not present

## 2023-07-07 DIAGNOSIS — R0781 Pleurodynia: Secondary | ICD-10-CM | POA: Diagnosis present

## 2023-07-07 DIAGNOSIS — I509 Heart failure, unspecified: Secondary | ICD-10-CM | POA: Diagnosis not present

## 2023-07-07 DIAGNOSIS — Z91040 Latex allergy status: Secondary | ICD-10-CM | POA: Insufficient documentation

## 2023-07-07 DIAGNOSIS — I10 Essential (primary) hypertension: Secondary | ICD-10-CM | POA: Diagnosis not present

## 2023-07-07 DIAGNOSIS — J449 Chronic obstructive pulmonary disease, unspecified: Secondary | ICD-10-CM | POA: Diagnosis not present

## 2023-07-07 DIAGNOSIS — Z79899 Other long term (current) drug therapy: Secondary | ICD-10-CM | POA: Diagnosis not present

## 2023-07-07 DIAGNOSIS — D509 Iron deficiency anemia, unspecified: Secondary | ICD-10-CM | POA: Diagnosis not present

## 2023-07-07 DIAGNOSIS — F17210 Nicotine dependence, cigarettes, uncomplicated: Secondary | ICD-10-CM | POA: Insufficient documentation

## 2023-07-07 DIAGNOSIS — I2510 Atherosclerotic heart disease of native coronary artery without angina pectoris: Secondary | ICD-10-CM | POA: Diagnosis not present

## 2023-07-07 DIAGNOSIS — E119 Type 2 diabetes mellitus without complications: Secondary | ICD-10-CM | POA: Insufficient documentation

## 2023-07-07 DIAGNOSIS — K449 Diaphragmatic hernia without obstruction or gangrene: Secondary | ICD-10-CM

## 2023-07-07 DIAGNOSIS — I110 Hypertensive heart disease with heart failure: Secondary | ICD-10-CM | POA: Diagnosis not present

## 2023-07-07 DIAGNOSIS — K219 Gastro-esophageal reflux disease without esophagitis: Secondary | ICD-10-CM | POA: Diagnosis not present

## 2023-07-07 DIAGNOSIS — F32A Depression, unspecified: Secondary | ICD-10-CM | POA: Insufficient documentation

## 2023-07-07 DIAGNOSIS — Z1152 Encounter for screening for COVID-19: Secondary | ICD-10-CM | POA: Insufficient documentation

## 2023-07-07 DIAGNOSIS — J189 Pneumonia, unspecified organism: Secondary | ICD-10-CM | POA: Diagnosis not present

## 2023-07-07 DIAGNOSIS — R079 Chest pain, unspecified: Principal | ICD-10-CM | POA: Diagnosis present

## 2023-07-07 LAB — IRON AND TIBC
Iron: 23 ug/dL — ABNORMAL LOW (ref 28–170)
Saturation Ratios: 4 % — ABNORMAL LOW (ref 10.4–31.8)
TIBC: 623 ug/dL — ABNORMAL HIGH (ref 250–450)
UIBC: 600 ug/dL

## 2023-07-07 LAB — RETICULOCYTES
Immature Retic Fract: 17 % — ABNORMAL HIGH (ref 2.3–15.9)
RBC.: 4.71 MIL/uL (ref 3.87–5.11)
Retic Count, Absolute: 68.3 10*3/uL (ref 19.0–186.0)
Retic Ct Pct: 1.5 % (ref 0.4–3.1)

## 2023-07-07 LAB — BASIC METABOLIC PANEL
Anion gap: 9 (ref 5–15)
BUN: 11 mg/dL (ref 8–23)
CO2: 23 mmol/L (ref 22–32)
Calcium: 8.8 mg/dL — ABNORMAL LOW (ref 8.9–10.3)
Chloride: 102 mmol/L (ref 98–111)
Creatinine, Ser: 0.66 mg/dL (ref 0.44–1.00)
GFR, Estimated: >60 mL/min (ref >60)
Glucose, Bld: 90 mg/dL (ref 70–99)
Potassium: 3.8 mmol/L (ref 3.5–5.1)
Sodium: 134 mmol/L — ABNORMAL LOW (ref 135–145)

## 2023-07-07 LAB — RESP PANEL BY RT-PCR (RSV, FLU A&B, COVID)  RVPGX2
Influenza A by PCR: NEGATIVE
Influenza B by PCR: NEGATIVE
Resp Syncytial Virus by PCR: NEGATIVE
SARS Coronavirus 2 by RT PCR: NEGATIVE

## 2023-07-07 LAB — CBC
HCT: 30.6 % — ABNORMAL LOW (ref 36.0–46.0)
Hemoglobin: 9.4 g/dL — ABNORMAL LOW (ref 12.0–15.0)
MCH: 20 pg — ABNORMAL LOW (ref 26.0–34.0)
MCHC: 30.7 g/dL (ref 30.0–36.0)
MCV: 65.1 fL — ABNORMAL LOW (ref 80.0–100.0)
Platelets: 128 10*3/uL — ABNORMAL LOW (ref 150–400)
RBC: 4.7 MIL/uL (ref 3.87–5.11)
RDW: 19.9 % — ABNORMAL HIGH (ref 11.5–15.5)
WBC: 6.4 10*3/uL (ref 4.0–10.5)
nRBC: 0 % (ref 0.0–0.2)

## 2023-07-07 LAB — TROPONIN I (HIGH SENSITIVITY)
Troponin I (High Sensitivity): 9 ng/L (ref <18)
Troponin I (High Sensitivity): 10 ng/L (ref <18)

## 2023-07-07 LAB — LACTIC ACID, PLASMA: Lactic Acid, Venous: 1.2 mmol/L (ref 0.5–1.9)

## 2023-07-07 LAB — VITAMIN B12: Vitamin B-12: 368 pg/mL (ref 180–914)

## 2023-07-07 LAB — FOLATE: Folate: 17.2 ng/mL (ref >5.9)

## 2023-07-07 LAB — PROCALCITONIN: Procalcitonin: <0.1 ng/mL

## 2023-07-07 LAB — FERRITIN: Ferritin: 7 ng/mL — ABNORMAL LOW (ref 11–307)

## 2023-07-07 MED ORDER — MORPHINE SULFATE (PF) 4 MG/ML IV SOLN
4.0000 mg | Freq: Once | INTRAVENOUS | Status: AC
  Administered 2023-07-07: 4 mg via INTRAVENOUS
  Filled 2023-07-07: qty 1

## 2023-07-07 MED ORDER — SODIUM CHLORIDE 0.9 % IV SOLN: INTRAVENOUS | Status: AC

## 2023-07-07 MED ORDER — TRAZODONE HCL 50 MG PO TABS: 25.0000 mg | ORAL_TABLET | Freq: Every evening | ORAL | Status: DC | PRN

## 2023-07-07 MED ORDER — PANTOPRAZOLE SODIUM 40 MG PO TBEC
40.0000 mg | DELAYED_RELEASE_TABLET | Freq: Two times a day (BID) | ORAL | Status: DC
  Administered 2023-07-07 – 2023-07-08 (×3): 40 mg via ORAL
  Filled 2023-07-07 (×3): qty 1

## 2023-07-07 MED ORDER — LIDOCAINE 5 % EX PTCH
1.0000 | MEDICATED_PATCH | CUTANEOUS | Status: DC
Start: 2023-07-07 — End: 2023-07-08
  Administered 2023-07-07 – 2023-07-08 (×2): 1 via TRANSDERMAL
  Filled 2023-07-07 (×2): qty 1

## 2023-07-07 MED ORDER — VENLAFAXINE HCL ER 75 MG PO CP24
150.0000 mg | ORAL_CAPSULE | Freq: Every day | ORAL | Status: DC
  Administered 2023-07-07 – 2023-07-08 (×2): 150 mg via ORAL
  Filled 2023-07-07: qty 1
  Filled 2023-07-07: qty 2

## 2023-07-07 MED ORDER — NITROGLYCERIN 0.4 MG SL SUBL
0.4000 mg | SUBLINGUAL_TABLET | SUBLINGUAL | Status: DC | PRN
  Filled 2023-07-07: qty 1

## 2023-07-07 MED ORDER — NAPROXEN 250 MG PO TABS
500.0000 mg | ORAL_TABLET | Freq: Two times a day (BID) | ORAL | Status: DC
  Administered 2023-07-08: 500 mg via ORAL
  Filled 2023-07-07 (×4): qty 2

## 2023-07-07 MED ORDER — MAGNESIUM HYDROXIDE 400 MG/5ML PO SUSP: 30.0000 mL | Freq: Every day | ORAL | Status: DC | PRN

## 2023-07-07 MED ORDER — KETOROLAC TROMETHAMINE 30 MG/ML IJ SOLN
30.0000 mg | Freq: Once | INTRAMUSCULAR | Status: AC
  Administered 2023-07-07: 30 mg via INTRAVENOUS
  Filled 2023-07-07: qty 1

## 2023-07-07 MED ORDER — SODIUM CHLORIDE 0.9 % IV SOLN
500.0000 mg | INTRAVENOUS | Status: DC
  Administered 2023-07-08: 500 mg via INTRAVENOUS
  Filled 2023-07-07: qty 5

## 2023-07-07 MED ORDER — ONDANSETRON HCL 4 MG/2ML IJ SOLN
4.0000 mg | Freq: Once | INTRAMUSCULAR | Status: AC
  Administered 2023-07-07: 4 mg via INTRAVENOUS
  Filled 2023-07-07: qty 2

## 2023-07-07 MED ORDER — SODIUM CHLORIDE 0.9 % IV SOLN
2.0000 g | INTRAVENOUS | Status: DC
  Administered 2023-07-08: 2 g via INTRAVENOUS
  Filled 2023-07-07: qty 20

## 2023-07-07 MED ORDER — GUAIFENESIN ER 600 MG PO TB12
600.0000 mg | ORAL_TABLET | Freq: Two times a day (BID) | ORAL | Status: DC
  Administered 2023-07-07 – 2023-07-08 (×3): 600 mg via ORAL
  Filled 2023-07-07 (×3): qty 1

## 2023-07-07 MED ORDER — NYSTATIN 100000 UNIT/GM EX POWD
Freq: Three times a day (TID) | CUTANEOUS | Status: DC
  Filled 2023-07-07: qty 15

## 2023-07-07 MED ORDER — ENOXAPARIN SODIUM 40 MG/0.4ML IJ SOSY
40.0000 mg | PREFILLED_SYRINGE | INTRAMUSCULAR | Status: DC
  Administered 2023-07-07: 40 mg via SUBCUTANEOUS
  Filled 2023-07-07: qty 0.4

## 2023-07-07 MED ORDER — ONDANSETRON HCL 4 MG PO TABS: 4.0000 mg | ORAL_TABLET | Freq: Four times a day (QID) | ORAL | Status: DC | PRN

## 2023-07-07 MED ORDER — HYDROXYZINE HCL 50 MG PO TABS
25.0000 mg | ORAL_TABLET | Freq: Three times a day (TID) | ORAL | Status: DC
Start: 2023-07-07 — End: 2023-07-08
  Administered 2023-07-07 – 2023-07-08 (×4): 25 mg via ORAL
  Filled 2023-07-07 (×4): qty 1

## 2023-07-07 MED ORDER — FERROUS SULFATE 325 (65 FE) MG PO TABS
325.0000 mg | ORAL_TABLET | ORAL | Status: DC
  Administered 2023-07-07: 325 mg via ORAL
  Filled 2023-07-07: qty 1

## 2023-07-07 MED ORDER — ACETAMINOPHEN 325 MG PO TABS
650.0000 mg | ORAL_TABLET | Freq: Four times a day (QID) | ORAL | Status: DC | PRN
  Administered 2023-07-07: 650 mg via ORAL
  Filled 2023-07-07: qty 2

## 2023-07-07 MED ORDER — BENZONATATE 100 MG PO CAPS
200.0000 mg | ORAL_CAPSULE | Freq: Once | ORAL | Status: AC
  Administered 2023-07-07: 200 mg via ORAL
  Filled 2023-07-07: qty 2

## 2023-07-07 MED ORDER — PANTOPRAZOLE SODIUM 40 MG IV SOLR
40.0000 mg | Freq: Once | INTRAVENOUS | Status: AC
  Administered 2023-07-07: 40 mg via INTRAVENOUS
  Filled 2023-07-07: qty 10

## 2023-07-07 MED ORDER — CARVEDILOL 3.125 MG PO TABS
3.1250 mg | ORAL_TABLET | Freq: Two times a day (BID) | ORAL | Status: DC
Start: 2023-07-07 — End: 2023-07-08
  Administered 2023-07-07 – 2023-07-08 (×3): 3.125 mg via ORAL
  Filled 2023-07-07 (×3): qty 1

## 2023-07-07 MED ORDER — SODIUM CHLORIDE 0.9 % IV SOLN
1.0000 g | Freq: Once | INTRAVENOUS | Status: AC
  Administered 2023-07-07: 1 g via INTRAVENOUS
  Filled 2023-07-07: qty 10

## 2023-07-07 MED ORDER — IPRATROPIUM-ALBUTEROL 0.5-2.5 (3) MG/3ML IN SOLN
3.0000 mL | Freq: Four times a day (QID) | RESPIRATORY_TRACT | Status: DC
  Administered 2023-07-07 (×3): 3 mL via RESPIRATORY_TRACT
  Filled 2023-07-07 (×4): qty 3

## 2023-07-07 MED ORDER — NAPROXEN 500 MG PO TABS
500.0000 mg | ORAL_TABLET | Freq: Two times a day (BID) | ORAL | Status: DC
  Filled 2023-07-07: qty 1

## 2023-07-07 MED ORDER — FUROSEMIDE 20 MG PO TABS: 20.0000 mg | ORAL_TABLET | Freq: Every day | ORAL | Status: DC | PRN

## 2023-07-07 MED ORDER — IOHEXOL 350 MG/ML SOLN
75.0000 mL | Freq: Once | INTRAVENOUS | Status: AC | PRN
  Administered 2023-07-07: 75 mL via INTRAVENOUS

## 2023-07-07 MED ORDER — ONDANSETRON HCL 4 MG/2ML IJ SOLN: 4.0000 mg | Freq: Four times a day (QID) | INTRAMUSCULAR | Status: DC | PRN

## 2023-07-07 MED ORDER — ENSURE ENLIVE PO LIQD
237.0000 mL | Freq: Three times a day (TID) | ORAL | Status: DC
  Administered 2023-07-07 – 2023-07-08 (×3): 237 mL via ORAL

## 2023-07-07 MED ORDER — IRON SUCROSE 200 MG IVPB - SIMPLE MED
200.0000 mg | Freq: Once | Status: AC
  Administered 2023-07-07: 200 mg via INTRAVENOUS
  Filled 2023-07-07: qty 200

## 2023-07-07 MED ORDER — PREGABALIN 75 MG PO CAPS
300.0000 mg | ORAL_CAPSULE | Freq: Two times a day (BID) | ORAL | Status: DC
  Administered 2023-07-07 – 2023-07-08 (×3): 300 mg via ORAL
  Filled 2023-07-07 (×3): qty 4

## 2023-07-07 MED ORDER — ACETAMINOPHEN 650 MG RE SUPP: 650.0000 mg | Freq: Four times a day (QID) | RECTAL | Status: DC | PRN

## 2023-07-07 MED ORDER — SODIUM CHLORIDE 0.9 % IV SOLN
500.0000 mg | Freq: Once | INTRAVENOUS | Status: AC
  Administered 2023-07-07: 500 mg via INTRAVENOUS
  Filled 2023-07-07: qty 5

## 2023-07-07 MED ORDER — BENZONATATE 100 MG PO CAPS
200.0000 mg | ORAL_CAPSULE | Freq: Three times a day (TID) | ORAL | Status: DC | PRN
  Administered 2023-07-07 – 2023-07-08 (×2): 200 mg via ORAL
  Filled 2023-07-07 (×2): qty 2

## 2023-07-07 NOTE — Assessment & Plan Note (Signed)
-   We will continue antihypertensive therapy.

## 2023-07-07 NOTE — Assessment & Plan Note (Signed)
 -  We will continue PPI therapy

## 2023-07-07 NOTE — Assessment & Plan Note (Addendum)
 Patient likely is having post pneumonia pleuritic chest pain also concern of musculoskeletal pain.  Troponin negative. -Naproxen twice daily added -Lidocaine patch -Supportive care

## 2023-07-07 NOTE — Assessment & Plan Note (Signed)
 -  We will continue statin therapy.

## 2023-07-07 NOTE — H&P (Addendum)
 [place]   PATIENT NAME: Meghan Jimenez    MR#:  [id number]  DATE OF BIRTH:  [birth year]  DATE OF ADMISSION:  07/07/2023  PRIMARY CARE PHYSICIAN: [facility]   Patient is coming from: Home  REQUESTING/REFERRING PHYSICIAN: Chiquita Loth, MD  CHIEF COMPLAINT:   Chief Complaint  Patient presents with   Chest Pain    HISTORY OF PRESENT ILLNESS:  Meghan Jimenez is a 63 y.o. female with medical history significant for coronary artery disease, CHF, post hepatitis C liver cirrhosis, COPD, type II diabetes mellitus and essential hypertension, presented to the emergency room with acute onset of right-sided chest pain for the last couple days.  The patient has been having productive cough of yellowish sputum and dyspnea.  She  finished a course of antibiotics 5 days ago for community-acquired pneumonia without improvement.  She has been having tactile fever and chills.  No nausea or vomiting or abdominal pain.  No dysuria, oliguria or hematuria or flank pain.  ED Course: When she came the ER, BP was 156/85 with otherwise normal vital signs.  Labs revealed mild hyponatremia 134 and calcium of 8.8.  High sensitive troponin I was 9 and later 10.  Lactic acid was 1.2.  CBC showed anemia with hemoglobin 9.4 hematocrit 30.6 and platelets 128.  Respiratory panel came back negative.  Blood cultures were drawn. EKG as reviewed by me : EKG showed normal sinus rhythm and rate of 88 with right bundle branch block and Q waves inferiorly. Imaging: Two-view chest x-ray showed the following: 1. Partial medial right middle lobe collapse and consolidation. Follow-up chest radiograph is recommended in 3-4 weeks, following conservative therapy, to document resolution. If persistent at that time, dedicated contrast enhanced CT imaging of the chest is recommended for further evaluation.  The patient was given 4 mg of IV morphine sulfate, 4 mg of IV Zofran, 40 mg of IV Protonix and IV  Rocephin and Zithromax as well as Tessalon Perles.  She will be admitted to a medical telemetry bed for further evaluation and management. PAST MEDICAL HISTORY:   Past Medical History:  Diagnosis Date   CAD (coronary artery disease)    CHF (congestive heart failure) (HCC)    Cirrhosis (HCC)    COPD (chronic obstructive pulmonary disease) (HCC)    Diabetes mellitus without complication (HCC)    Hepatitis C    Hypertension     PAST SURGICAL HISTORY:   Past Surgical History:  Procedure Laterality Date   BACK SURGERY     CARDIAC SURGERY     ESOPHAGOGASTRODUODENOSCOPY (EGD) WITH PROPOFOL N/A 12/11/2015   Procedure: ESOPHAGOGASTRODUODENOSCOPY (EGD) WITH PROPOFOL;  Surgeon: [name], MD;  Location: [facility] ENDOSCOPY;  Service: Gastroenterology;  Laterality: N/A;   LEFT HEART CATH AND CORONARY ANGIOGRAPHY N/A 11/13/2016   Procedure: Left Heart Cath and Coronary Angiography;  Surgeon: [name], MD;  Location: [facility] INVASIVE CV LAB;  Service: Cardiovascular;  Laterality: N/A;    SOCIAL HISTORY:   Social History   Tobacco Use   Smoking status: Every Day    Current packs/day: 1.00    Types: Cigarettes   Smokeless tobacco: Never   Tobacco comments:    SMOKES 3-4 PKS/DAY  Substance Use Topics   Alcohol use: Yes    Comment: Drinks occasionally every week    FAMILY HISTORY:   Family History  Problem Relation Age of Onset   CAD Mother    Diabetes Mother    Lung  cancer Father     DRUG ALLERGIES:   Allergies  Allergen Reactions   Aspirin Anaphylaxis and Palpitations    Patient takes ibuprofen as home med, so no problem with NSAIDs  Pt took aspirin today and no reaction at all 10/03/22   Tramadol Anaphylaxis   Acetaminophen     Patient reports she is supposed to limit intake due to liver issues   Latex    Codeine Rash   Vicodin [Hydrocodone-Acetaminophen] Palpitations    REVIEW OF SYSTEMS:   ROS As per history of present illness. All pertinent systems were  reviewed above. Constitutional, HEENT, cardiovascular, respiratory, GI, GU, musculoskeletal, neuro, psychiatric, endocrine, integumentary and hematologic systems were reviewed and are otherwise negative/unremarkable except for positive findings mentioned above in the HPI.   MEDICATIONS AT HOME:   Prior to Admission medications   Medication Sig Start Date End Date Taking? Authorizing Provider  albuterol (VENTOLIN HFA) 108 (90 Base) MCG/ACT inhaler Inhale 1-2 puffs into the lungs every 4 (four) hours as needed for wheezing or shortness of breath. 01/10/22   [provider]  carvedilol (COREG) 3.125 MG tablet Take 3.125 mg by mouth 2 (two) times daily. 11/21/21   [provider]  diphenhydrAMINE (BENADRYL) 50 MG capsule Take 50 mg by mouth every 6 (six) hours as needed.    [provider]  feeding supplement (ENSURE ENLIVE / ENSURE PLUS) LIQD Take 237 mLs by mouth 3 (three) times daily between meals. 10/06/22   Lucile Shutters, MD  Ferrous Sulfate (IRON) 325 (65 Fe) MG TABS Take 1 tablet by mouth once a week. 07/19/22   [provider]  furosemide (LASIX) 20 MG tablet Take 20 mg by mouth daily as needed for edema or fluid. 07/19/22   [provider]  hydrOXYzine (ATARAX) 25 MG tablet Take 25 mg by mouth 3 (three) times daily. 12/26/21   [provider]  ketorolac (TORADOL) 10 MG tablet Take 1 tablet (10 mg total) by mouth every 6 (six) hours as needed. 10/15/22   Merwyn Katos, MD  NITROSTAT 0.4 MG SL tablet Place 0.4 mg under the tongue every 5 (five) minutes as needed for chest pain. 07/19/22   [provider]  pantoprazole (PROTONIX) 40 MG tablet Take 40 mg by mouth 2 (two) times daily before a meal.    [provider]  pregabalin (LYRICA) 300 MG capsule Take 300 mg by mouth 2 (two) times daily.    [provider]  SPIRIVA HANDIHALER 18 MCG inhalation capsule Place 1 capsule into inhaler and inhale daily. 03/01/22   [provider]  spironolactone (ALDACTONE) 50 MG tablet Take 50 mg by mouth daily as needed. 07/19/22   [provider]  venlafaxine XR (EFFEXOR-XR) 150 MG 24 hr capsule Take 150 mg by mouth daily. 11/21/21   [provider]      VITAL SIGNS:  Blood pressure (!) 140/80, pulse 94, temperature 98.4 F (36.9 C), temperature source Oral, resp. rate 20, height 5\' 4"  (1.626 m), weight 79.4 kg, SpO2 94%.  PHYSICAL EXAMINATION:  Physical Exam  GENERAL:  63 y.o.-year-old patient lying in the bed with no acute distress.  EYES: Pupils equal, round, reactive to light and accommodation. No scleral icterus. Extraocular muscles intact.  HEENT: Head atraumatic, normocephalic. Oropharynx and nasopharynx clear.  NECK:  Supple, no jugular venous distention. No thyroid enlargement, no tenderness.  LUNGS: Diminished right basal and midlung zone breath sounds with right midlung zone crackles.. No use of accessory muscles  of respiration.  CARDIOVASCULAR: Regular rate and rhythm, S1, S2 normal. No murmurs, rubs, or gallops.  ABDOMEN: Soft, nondistended, nontender. Bowel sounds present. No organomegaly or mass.  EXTREMITIES: No pedal edema, cyanosis, or clubbing.  NEUROLOGIC: Cranial nerves II through XII are intact. Muscle strength 5/5 in all extremities. Sensation intact. Gait not checked.  PSYCHIATRIC: The patient is alert and oriented x 3.  Normal affect and good eye contact. SKIN: No obvious rash, lesion, or ulcer.   LABORATORY PANEL:   CBC Recent Labs  Lab 07/07/23 0033  WBC 6.4  HGB 9.4*  HCT 30.6*  PLT 128*   ------------------------------------------------------------------------------------------------------------------  Chemistries  Recent Labs  Lab 07/07/23 0033  NA 134*  K 3.8  CL 102  CO2 23  GLUCOSE 90  BUN 11  CREATININE 0.66  CALCIUM 8.8*    ------------------------------------------------------------------------------------------------------------------  Cardiac Enzymes No results for input(s): "TROPONINI" in the last 168 hours. ------------------------------------------------------------------------------------------------------------------  RADIOLOGY:  DG Chest 2 View Result Date: 07/07/2023 CLINICAL DATA:  Chest pain EXAM: CHEST - 2 VIEW COMPARISON:  10/14/2022 FINDINGS: There is partial medial right middle lobe collapse and consolidation resulting in opacification of the right cardiophrenic angle. Linear atelectasis within the right mid lung zone. Lungs are otherwise clear. No pneumothorax or pleural effusion. Coronary artery bypass grafting has been performed. Cardiac size within normal limits. Pulmonary vascularity is normal. No acute bone abnormality. IMPRESSION: 1. Partial medial right middle lobe collapse and consolidation. Follow-up chest radiograph is recommended in 3-4 weeks, following conservative therapy, to document resolution. If persistent at that time, dedicated contrast enhanced CT imaging of the chest is recommended for further evaluation. Electronically Signed   By: Helyn Numbers M.D.   On: 07/07/2023 01:35      IMPRESSION AND PLAN:  Assessment and Plan: * CAP (community acquired pneumonia) - The patient will be admitted to a medical telemetry bed. - Will continue antibiotic therapy with IV Rocephin and Zithromax. - Mucolytic therapy be provided as well as duo nebs q.i.d. and q.4 hours p.r.n. - We will follow blood cultures.   Pleuritic chest pain - To be placed on as needed IV Toradol. - We will follow serial troponins. - She will still be placed on as needed IV morphine sulfate and sublingual nitroglycerin. - We will continue aspirin and beta-blocker therapy as well as statin therapy.  Dyslipidemia - We will continue statin therapy.  Essential hypertension - We will continue antihypertensive  therapy.  GERD without esophagitis - We will continue PPI therapy.  Depression - We will continue her Effexor XR.   Will obtain chest CTA to rule out PE.  DVT prophylaxis: Lovenox.  Advanced Care Planning:  Code Status: full code.  Family Communication:  The plan of care was discussed in details with the patient (and family). I answered all questions. The patient agreed to proceed with the above mentioned plan. Further management will depend upon hospital course. Disposition Plan: Back to previous home environment Consults called: none.  All the records are reviewed and case discussed with ED provider.  Status is: Inpatient  At the time of the admission, it appears that the appropriate admission status for this patient is inpatient.  This is judged to be reasonable and necessary in order to provide the required intensity of service to ensure the patient's safety given the presenting symptoms, physical exam findings and initial radiographic and laboratory data in the context of comorbid conditions.  The patient requires inpatient status due to high intensity of service, high risk  of further deterioration and high frequency of surveillance required.  I certify that at the time of admission, it is my clinical judgment that the patient will require inpatient hospital care extending more than 2 midnights.                            Dispo: The patient is from: Home              Anticipated d/c is to: Home              Patient currently is not medically stable to d/c.              Difficult to place patient: No  Hannah Beat M.D on 07/07/2023 at 7:10 AM  [facility]   From 7 PM-7 AM, contact night-coverage [email or web address]  CC: Primary care physician; [employer]

## 2023-07-07 NOTE — ED Provider Notes (Signed)
 [facility] Provider Note    Event Date/Time   First MD Initiated Contact with Patient 07/07/23 0154     (approximate)   History   Chest Pain   HPI  Meghan Jimenez is a 63 y.o. female who presents to the ED from home with a chief complaint of right sided chest pain x 1.5 days.  History of CAD, COPD, CHF, diabetes not on home oxygen.  States that she finished antibiotics 5 days ago for pneumonia.  Continues with productive cough.  Known large hiatal hernia.  Denies fever/chills, shortness of breath, abdominal pain, nausea, vomiting or dizziness.     Past Medical History   Past Medical History:  Diagnosis Date   CAD (coronary artery disease)    CHF (congestive heart failure) (HCC)    Cirrhosis (HCC)    COPD (chronic obstructive pulmonary disease) (HCC)    Diabetes mellitus without complication (HCC)    Hepatitis C    Hypertension      Active Problem List   Patient Active Problem List   Diagnosis Date Noted   CAP (community acquired pneumonia) 10/03/2022   Pneumonia 10/03/2022   Epigastric hernia 10/03/2022   Acute on chronic respiratory failure with hypoxia (HCC) 08/15/2022   COPD exacerbation (HCC) 08/13/2022   DDD (degenerative disc disease), cervical 08/13/2022   Diabetes mellitus, type 2 (HCC) 08/13/2022   Hepatitis C 08/13/2022   Chronic diastolic CHF (congestive heart failure) (HCC) 08/13/2022   Hypokalemia 02/24/2022   Substance abuse (HCC) 02/24/2022   Tobacco use disorder 02/24/2022   Essential hypertension 02/24/2022   GERD (gastroesophageal reflux disease) 02/24/2022   Emphysema lung (HCC) 02/24/2022   Iron deficiency anemia 05/03/2021   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   Hyponatremia 06/29/2017   Poor dentition 06/29/2017   History of Clostridium difficile infection 05/03/2017   Severe protein-calorie malnutrition (HCC) 03/20/2017   Hypoalbuminemia 01/09/2017   Delayed postoperative wound closure  12/07/2016   Mediastinitis 12/07/2016   History of coronary artery bypass graft x 1 11/17/2016   Cirrhosis of liver (HCC) 11/14/2016   Hyperlipidemia 11/14/2016   Chest pain 11/12/2016   PTSD (post-traumatic stress disorder) 12/13/2015   Dysthymia 12/13/2015   GIB (gastrointestinal bleeding) 12/10/2015   Unstable angina (HCC) 11/11/2015   Anxiety 06/14/2011   History of alcohol abuse 12/15/2010   History of cocaine abuse (HCC) 12/15/2010   Dysphagia 07/04/2010     Past Surgical History   Past Surgical History:  Procedure Laterality Date   BACK SURGERY     CARDIAC SURGERY     ESOPHAGOGASTRODUODENOSCOPY (EGD) WITH PROPOFOL N/A 12/11/2015   Procedure: ESOPHAGOGASTRODUODENOSCOPY (EGD) WITH PROPOFOL;  Surgeon: [name], MD;  Location: [facility] ENDOSCOPY;  Service: Gastroenterology;  Laterality: N/A;   LEFT HEART CATH AND CORONARY ANGIOGRAPHY N/A 11/13/2016   Procedure: Left Heart Cath and Coronary Angiography;  Surgeon: [name], MD;  Location: [facility] INVASIVE CV LAB;  Service: Cardiovascular;  Laterality: N/A;     Home Medications   Prior to Admission medications   Medication Sig Start Date End Date Taking? Authorizing Provider  albuterol (VENTOLIN HFA) 108 (90 Base) MCG/ACT inhaler Inhale 1-2 puffs into the lungs every 4 (four) hours as needed for wheezing or shortness of breath. 01/10/22   [provider]  carvedilol (COREG) 3.125 MG tablet Take 3.125 mg by mouth 2 (two) times daily. 11/21/21   [provider]  diphenhydrAMINE (BENADRYL) 50 MG capsule Take 50 mg by mouth every 6 (six) hours  as needed.    [provider]  feeding supplement (ENSURE ENLIVE / ENSURE PLUS) LIQD Take 237 mLs by mouth 3 (three) times daily between meals. 10/06/22   Lucile Shutters, MD  Ferrous Sulfate (IRON) 325 (65 Fe) MG TABS Take 1 tablet by mouth once a week. 07/19/22   [provider]  furosemide (LASIX) 20 MG tablet Take 20 mg by mouth daily as needed for  edema or fluid. 07/19/22   [provider]  hydrOXYzine (ATARAX) 25 MG tablet Take 25 mg by mouth 3 (three) times daily. 12/26/21   [provider]  ketorolac (TORADOL) 10 MG tablet Take 1 tablet (10 mg total) by mouth every 6 (six) hours as needed. 10/15/22   Merwyn Katos, MD  NITROSTAT 0.4 MG SL tablet Place 0.4 mg under the tongue every 5 (five) minutes as needed for chest pain. 07/19/22   [provider]  pantoprazole (PROTONIX) 40 MG tablet Take 40 mg by mouth 2 (two) times daily before a meal.    [provider]  pregabalin (LYRICA) 300 MG capsule Take 300 mg by mouth 2 (two) times daily.    [provider]  SPIRIVA HANDIHALER 18 MCG inhalation capsule Place 1 capsule into inhaler and inhale daily. 03/01/22   [provider]  spironolactone (ALDACTONE) 50 MG tablet Take 50 mg by mouth daily as needed. 07/19/22   [provider]  venlafaxine XR (EFFEXOR-XR) 150 MG 24 hr capsule Take 150 mg by mouth daily. 11/21/21   [provider]     Allergies  Aspirin, Tramadol, Acetaminophen, Latex, Codeine, and Vicodin [hydrocodone-acetaminophen]   Family History   Family History  Problem Relation Age of Onset   CAD Mother    Diabetes Mother    Lung cancer Father      Physical Exam  Triage Vital Signs: ED Triage Vitals  Encounter Vitals Group     BP 07/07/23 0027 (!) 156/85     Systolic BP Percentile --      Diastolic BP Percentile --      Pulse Rate 07/07/23 0027 95     Resp 07/07/23 0027 16     Temp 07/07/23 0027 98.4 F (36.9 C)     Temp Source 07/07/23 0027 Oral     SpO2 07/07/23 0027 97 %     Weight 07/07/23 0028 175 lb (79.4 kg)     Height 07/07/23 0028 5\' 4"  (1.626 m)     Head Circumference --      Peak Flow --      Pain Score 07/07/23 0028 9     Pain Loc --      Pain Education --      Exclude from Growth Chart --     Updated Vital Signs: BP (!) 147/67   Pulse 92   Temp 98.4 F (36.9 C) (Oral)    Resp 16   Ht 5\' 4"  (1.626 m)   Wt 79.4 kg   SpO2 97%   BMI 30.04 kg/m    General: Awake, mild distress.  CV:  RRR.  Good peripheral perfusion.  Resp:  Normal effort.  CTAB.  Loose, rattly cough noted. Abd:  Protruding hernia easily reducible.  Nontender.  No distention.  Other:     ED Results / Procedures / Treatments  Labs (all labs ordered are listed, but only abnormal results are displayed) Labs Reviewed  BASIC METABOLIC PANEL - Abnormal; Notable for the following components:      Result  Value   Sodium 134 (*)    Calcium 8.8 (*)    All other components within normal limits  CBC - Abnormal; Notable for the following components:   Hemoglobin 9.4 (*)    HCT 30.6 (*)    MCV 65.1 (*)    MCH 20.0 (*)    RDW 19.9 (*)    Platelets 128 (*)    All other components within normal limits  RESP PANEL BY RT-PCR (RSV, FLU A&B, COVID)  RVPGX2  CULTURE, BLOOD (ROUTINE X 2)  CULTURE, BLOOD (ROUTINE X 2)  LACTIC ACID, PLASMA  TROPONIN I (HIGH SENSITIVITY)  TROPONIN I (HIGH SENSITIVITY)     EKG  ED ECG REPORT I, Yumi Insalaco J, the attending physician, personally viewed and interpreted this ECG.   Date: 07/07/2023  EKG Time: 0021  Rate: 95  Rhythm: normal sinus rhythm  Axis: Normal  Intervals:right bundle branch block  ST&T Change: Nonspecific    RADIOLOGY I have independently visualized and interpreted patient's imaging study as well as noted the radiology interpretation:  Chest x-ray: Partial medial right middle lobe collapse and consolidation  Official radiology report(s): DG Chest 2 View Result Date: 07/07/2023 CLINICAL DATA:  Chest pain EXAM: CHEST - 2 VIEW COMPARISON:  10/14/2022 FINDINGS: There is partial medial right middle lobe collapse and consolidation resulting in opacification of the right cardiophrenic angle. Linear atelectasis within the right mid lung zone. Lungs are otherwise clear. No pneumothorax or pleural effusion. Coronary artery bypass grafting has been  performed. Cardiac size within normal limits. Pulmonary vascularity is normal. No acute bone abnormality. IMPRESSION: 1. Partial medial right middle lobe collapse and consolidation. Follow-up chest radiograph is recommended in 3-4 weeks, following conservative therapy, to document resolution. If persistent at that time, dedicated contrast enhanced CT imaging of the chest is recommended for further evaluation. Electronically Signed   By: Helyn Numbers M.D.   On: 07/07/2023 01:35     PROCEDURES:  Critical Care performed: No  .1-3 Lead EKG Interpretation  Performed by: Irean Hong, MD Authorized by: Irean Hong, MD     Interpretation: normal     ECG rate:  90   ECG rate assessment: normal     Rhythm: sinus rhythm     Ectopy: none     Conduction: normal   Comments:     Patient placed on cardiac monitor to evaluate for arrhythmias    MEDICATIONS ORDERED IN ED: Medications  ondansetron (ZOFRAN) injection 4 mg (4 mg Intravenous Given 07/07/23 0242)  morphine (PF) 4 MG/ML injection 4 mg (4 mg Intravenous Given 07/07/23 0242)  pantoprazole (PROTONIX) injection 40 mg (40 mg Intravenous Given 07/07/23 0241)  cefTRIAXone (ROCEPHIN) 1 g in sodium chloride 0.9 % 100 mL IVPB (0 g Intravenous Stopped 07/07/23 0310)  azithromycin (ZITHROMAX) 500 mg in sodium chloride 0.9 % 250 mL IVPB (500 mg Intravenous New Bag/Given 07/07/23 0241)  benzonatate (TESSALON) capsule 200 mg (200 mg Oral Given 07/07/23 0241)     IMPRESSION / MDM / ASSESSMENT AND PLAN / ED COURSE  I reviewed the triage vital signs and the nursing notes.                             63 year old female presenting with chest pain. Differential diagnosis includes, but is not limited to, ACS, aortic dissection, pulmonary embolism, cardiac tamponade, pneumothorax, pneumonia, pericarditis, myocarditis, GI-related causes including esophagitis/gastritis, and musculoskeletal chest wall pain.   I personally reviewed  patient's records and note admission  for pneumonia and hiatal hernia from 02/02/2023.  Patient's presentation is most consistent with acute illness / injury with system symptoms.  The patient is on the cardiac monitor to evaluate for evidence of arrhythmia and/or significant heart rate changes.  Laboratory results unremarkable; stable anemia.  Troponin negative.  Chest x-ray concerning for right middle lobe consolidation.  Given patient was recently on antibiotics for pneumonia, will obtain blood cultures, lactic acid, respiratory panel.  Administer IV Rocephin/azithromycin, morphine for chest pain, Protonix for hiatal hernia, Tessalon for cough.  Will reassess.  Clinical Course as of 07/07/23 0415  Sat Jul 07, 2023  0319 Lactic acid unremarkable.  Will consult hospitalist services for evaluation and admission for chest pain and community-acquired pneumonia failed outpatient antibiotics. [JS]    Clinical Course User Index [JS] Irean Hong, MD     FINAL CLINICAL IMPRESSION(S) / ED DIAGNOSES   Final diagnoses:  Chest pain, unspecified type  Community acquired pneumonia of right lung, unspecified part of lung  Hiatal hernia     Rx / DC Orders   ED Discharge Orders     None        Note:  This document was prepared using Dragon voice recognition software and may include unintentional dictation errors.   Irean Hong, MD 07/07/23 [phone]

## 2023-07-07 NOTE — Assessment & Plan Note (Signed)
-   We will continue her Effexor XR.

## 2023-07-07 NOTE — ED Notes (Signed)
 Pt was placed on 2 liters of Oxygen for comfort. O2 sats 97% on RA.

## 2023-07-07 NOTE — Progress Notes (Signed)
 Progress Note   Patient: Meghan Jimenez:[id number] DOB: Sep 06, 1960 DOA: 07/07/2023     0 DOS: the patient was seen and examined on 07/07/2023   Brief hospital course: Taken from H&P.  Meghan Jimenez is a 63 y.o. female with medical history significant for coronary artery disease, CHF, post hepatitis C liver cirrhosis, COPD, type II diabetes mellitus and essential hypertension, presented to the emergency room with acute onset of right-sided chest pain for the last couple days.  The patient has been having productive cough of yellowish sputum and dyspnea.  She  finished a course of antibiotics 5 days ago for community-acquired pneumonia without improvement.   On presentation stable vital, no hypoxia.  Labs with borderline hyponatremia at 134, negative troponin, lactic acid 1.2, CBC with hemoglobin of 9.4 and platelet of 128.  No leukocytosis.  Respiratory panel negative.  Blood cultures were ordered. CXR with partial medial right middle lobe collapse and consolidation with recommendation for a repeat imaging in 3 to 4 weeks to document resolution if persists she will need a contrast enhanced CT imaging for further evaluation. EKG with NSR, RBBB and Q waves inferiorly.  Due to worsening pleuritic chest pain-CTA chest was also ordered.  3/8: Vitals stable, CTA with no PE, no significant pneumonia or consolidation, right middle lobe volume loss with atelectasis.  A 4 mm subpleural left upper lobe nodule was seen which is new and need follow-up for incidental pulmonary nodule guidelines.  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  Transverse colon herniated up through the midline epigastric region into the subcutaneous tissue anterior to the lower sternum, no complicating features noted.  Chronic liver cirrhosis.  Procalcitonin negative. Patient with very nonspecific complaints, likely musculoskeletal right-sided chest pain for which lidocaine patch was ordered. Patient was  feeling weak so PT evaluation ordered. He was complaining of pruritic rash under breast and at lower abdomen-nystatin powder ordered.       Assessment and Plan: * CAP (community acquired pneumonia) - The patient will be admitted to a medical telemetry bed. - Will continue antibiotic therapy with IV Rocephin and Zithromax. - Mucolytic therapy be provided as well as duo nebs q.i.d. and q.4 hours p.r.n. - We will follow blood cultures. -Procalcitonin negative-if cultures remain negative we can stop antibiotics tomorrow   Pleuritic chest pain Patient likely is having post pneumonia pleuritic chest pain also concern of musculoskeletal pain.  Troponin negative. -Naproxen twice daily added -Lidocaine patch -Supportive care  Dyslipidemia - We will continue statin therapy.  Essential hypertension - We will continue antihypertensive therapy.  GERD without esophagitis - We will continue PPI therapy.  Depression - We will continue her Effexor XR.  Iron deficiency anemia Patient has microcytic anemia, anemia panel with iron deficiency. -Ordered 1 dose of IV iron -P.o. iron supplement for home   Subjective: Patient was complaining of very nonspecific chest pain mostly involving lower sternal and right chest wall. She had a large hernia on the front lower chest wall which she was keep finding that this is my heart and hurting. reducible. She was also complaining of rash under breast and lower abdomen which is itchy.  Physical Exam: Vitals:   07/07/23 0901 07/07/23 0958 07/07/23 0959 07/07/23 1145  BP: (!) 179/64   (!) 154/65  Pulse: 100 90 94 89  Resp: (!) 21 18 16 19   Temp:      TempSrc:      SpO2: 92% 100% 96% 91%  Weight:  Height:       General.  Frail lady, in no acute distress.  Midline sternotomy scar with hernia noted in lower sternum area. Pulmonary.  Lungs clear bilaterally, normal respiratory effort. CV.  Regular rate and rhythm, no JVD, rub or  murmur. Abdomen.  Soft, nontender, nondistended, BS positive. CNS.  Alert and oriented .  No focal neurologic deficit. Extremities.  No edema, no cyanosis, pulses intact and symmetrical.  Data Reviewed: Prior data reviewed  Family Communication: Discussed with patient  Disposition: Status is: Inpatient Remains inpatient appropriate because: Likely be discharged tomorrow  Planned Discharge Destination: Home  DVT prophylaxis.  Lovenox Time spent:  minutes  This record has been created using Conservation officer, historic buildings. Errors have been sought and corrected,but may not always be located. Such creation errors do not reflect on the standard of care.   Author: Arnetha Courser, MD 07/07/2023 1:48 PM  For on call review [email or web address].

## 2023-07-07 NOTE — ED Notes (Signed)
 Patient assisted to [facility] and then back to bed. Side rails up and bed locked, in lowest position. Call bell in reach.

## 2023-07-07 NOTE — Hospital Course (Addendum)
 Taken from H&P.  Meghan Jimenez is a 63 y.o. female with medical history significant for coronary artery disease, CHF, post hepatitis C liver cirrhosis, COPD, type II diabetes mellitus and essential hypertension, presented to the emergency room with acute onset of right-sided chest pain for the last couple days.  The patient has been having productive cough of yellowish sputum and dyspnea.  She  finished a course of antibiotics 5 days ago for community-acquired pneumonia without improvement.   On presentation stable vital, no hypoxia.  Labs with borderline hyponatremia at 134, negative troponin, lactic acid 1.2, CBC with hemoglobin of 9.4 and platelet of 128.  No leukocytosis.  Respiratory panel negative.  Blood cultures were ordered. CXR with partial medial right middle lobe collapse and consolidation with recommendation for a repeat imaging in 3 to 4 weeks to document resolution if persists she will need a contrast enhanced CT imaging for further evaluation. EKG with NSR, RBBB and Q waves inferiorly.  Due to worsening pleuritic chest pain-CTA chest was also ordered.  3/8: Vitals stable, CTA with no PE, no significant pneumonia or consolidation, right middle lobe volume loss with atelectasis.  A 4 mm subpleural left upper lobe nodule was seen which is new and need follow-up for incidental pulmonary nodule guidelines.  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  Transverse colon herniated up through the midline epigastric region into the subcutaneous tissue anterior to the lower sternum, no complicating features noted.  Chronic liver cirrhosis.  Procalcitonin negative. Patient with very nonspecific complaints, likely musculoskeletal right-sided chest pain for which lidocaine patch was ordered. Patient was feeling weak so PT evaluation ordered. He was complaining of pruritic rash under breast and at lower abdomen-nystatin powder ordered.  3/9: Patient remained hemodynamically  stable.  We discontinued antibiotic as she has been adequately treated for pneumonia.  Chest pain is likely musculoskeletal with some pleuritic component.  She was given lidocaine patches and can continue with her home Toradol tablets. She was also given nystatin powder for intertriginous under breast and lower abdomen.  PT evaluated her and recommended home health services which was ordered.  Patient was also instructed to see outpatient general surgery to explore options for her large hernia involving lower sternal area.  She will continue the rest of her home medications and need to have a close follow-up with her providers for further assistance.

## 2023-07-07 NOTE — ED Notes (Signed)
 Pt reporting no relief in chest pain since morphine. RN offered nitroglycerin PRN. Pt refused at this time.

## 2023-07-07 NOTE — Assessment & Plan Note (Addendum)
-   The patient will be admitted to a medical telemetry bed. - Will continue antibiotic therapy with IV Rocephin and Zithromax. - Mucolytic therapy be provided as well as duo nebs q.i.d. and q.4 hours p.r.n. - We will follow blood cultures. -Procalcitonin negative-if cultures remain negative we can stop antibiotics tomorrow

## 2023-07-07 NOTE — ED Triage Notes (Signed)
 Pt reports she started with chest pain since Thursday night, pt reports she was laying in bed when started to feel  tightness on her chest pt reports she took nitroglycerin and help some with the pain. Pt reports pain has not improved today and has shortness of breath. Pt reports hx of COPD uses oxygen only at night and continues to smoke. Upon assessment noted pt has a protruding hernia in the epigastric area. Pt talks in complete sentences no respiratory distress noted

## 2023-07-07 NOTE — ED Notes (Signed)
 Pt to CT at this time.

## 2023-07-07 NOTE — Assessment & Plan Note (Signed)
 Patient has microcytic anemia, anemia panel with iron deficiency. -Ordered 1 dose of IV iron -P.o. iron supplement for home

## 2023-07-08 DIAGNOSIS — F32A Depression, unspecified: Secondary | ICD-10-CM

## 2023-07-08 DIAGNOSIS — D509 Iron deficiency anemia, unspecified: Secondary | ICD-10-CM

## 2023-07-08 DIAGNOSIS — J189 Pneumonia, unspecified organism: Secondary | ICD-10-CM | POA: Diagnosis not present

## 2023-07-08 DIAGNOSIS — I10 Essential (primary) hypertension: Secondary | ICD-10-CM | POA: Diagnosis not present

## 2023-07-08 DIAGNOSIS — E785 Hyperlipidemia, unspecified: Secondary | ICD-10-CM | POA: Diagnosis not present

## 2023-07-08 DIAGNOSIS — R0781 Pleurodynia: Secondary | ICD-10-CM | POA: Diagnosis not present

## 2023-07-08 LAB — BASIC METABOLIC PANEL
Anion gap: 5 (ref 5–15)
BUN: 15 mg/dL (ref 8–23)
CO2: 25 mmol/L (ref 22–32)
Calcium: 8 mg/dL — ABNORMAL LOW (ref 8.9–10.3)
Chloride: 105 mmol/L (ref 98–111)
Creatinine, Ser: 0.65 mg/dL (ref 0.44–1.00)
GFR, Estimated: >60 mL/min (ref >60)
Glucose, Bld: 122 mg/dL — ABNORMAL HIGH (ref 70–99)
Potassium: 3.9 mmol/L (ref 3.5–5.1)
Sodium: 135 mmol/L (ref 135–145)

## 2023-07-08 LAB — CBC
HCT: 26.5 % — ABNORMAL LOW (ref 36.0–46.0)
Hemoglobin: 8.2 g/dL — ABNORMAL LOW (ref 12.0–15.0)
MCH: 20 pg — ABNORMAL LOW (ref 26.0–34.0)
MCHC: 30.9 g/dL (ref 30.0–36.0)
MCV: 64.6 fL — ABNORMAL LOW (ref 80.0–100.0)
Platelets: 109 10*3/uL — ABNORMAL LOW (ref 150–400)
RBC: 4.1 MIL/uL (ref 3.87–5.11)
RDW: 19.9 % — ABNORMAL HIGH (ref 11.5–15.5)
WBC: 5 10*3/uL (ref 4.0–10.5)
nRBC: 0 % (ref 0.0–0.2)

## 2023-07-08 MED ORDER — IPRATROPIUM-ALBUTEROL 0.5-2.5 (3) MG/3ML IN SOLN: 3.0000 mL | Freq: Four times a day (QID) | RESPIRATORY_TRACT | Status: DC | PRN

## 2023-07-08 MED ORDER — FE FUM-VIT C-VIT B12-FA 460-60-0.01-1 MG PO CAPS
1.0000 | ORAL_CAPSULE | Freq: Two times a day (BID) | ORAL | Status: DC
  Administered 2023-07-08: 1 via ORAL
  Filled 2023-07-08: qty 1

## 2023-07-08 MED ORDER — BENZONATATE 200 MG PO CAPS
200.0000 mg | ORAL_CAPSULE | Freq: Three times a day (TID) | ORAL | 0 refills | Status: AC | PRN
Start: 2023-07-08 — End: ?

## 2023-07-08 MED ORDER — NYSTATIN 100000 UNIT/GM EX POWD
Freq: Three times a day (TID) | CUTANEOUS | 0 refills | Status: AC
Start: 2023-07-08 — End: ?

## 2023-07-08 MED ORDER — LIDOCAINE 5 % EX PTCH: 1.0000 | MEDICATED_PATCH | CUTANEOUS | 0 refills | Status: AC

## 2023-07-08 NOTE — TOC Transition Note (Signed)
 Transition of Care [facility]) - Discharge Note   Patient Details  Name: Meghan Jimenez MRN: [id number] Date of Birth: July 19, 1960  [facility]) CM/SW Contact:  Bing Quarry, RN Phone Number: 07/08/2023, 11:26 AM   Clinical Narrative: 3/ 9: Patient has discharge orders with home health set up via [facility] for PT/OT post discharge. Attempted to reach out to son, but may be invalid number.  Home health orders in the chart.    Gabriel Cirri MSN RN CM  RN Case Manager [place]  Transitions of Care Direct Dial: [phone] (Weekends Only) [facility] Main Office Phone: [phone] [facility] Fax: [phone] [email or web address]      Final next level of care: Home w Home Health Services     Patient Goals and CMS Choice            Discharge Placement                       Discharge Plan and Services Additional resources added to the After Visit Summary for                              [facility] Agency: [facility]        Social Drivers of Health (SDOH) Interventions SDOH Screenings   Food Insecurity: No Food Insecurity (07/07/2023)  Housing: Low Risk  (07/07/2023)  Transportation Needs: No Transportation Needs (07/07/2023)  Utilities: Not At Risk (07/07/2023)  Financial Resource Strain: High Risk (02/06/2023)   Received from [facility]  Tobacco Use: High Risk (02/06/2023)   Received from [facility]     Readmission Risk Interventions    10/06/2022   12:02 PM  Readmission Risk Prevention Plan  Transportation Screening Complete  PCP or Specialist Appt within 3-5 Days Complete  HRI or Home Care Consult Complete  Social Work Consult for Recovery Care Planning/Counseling Complete  Palliative Care Screening Not Applicable  Medication Review [employer]) Referral to Pharmacy

## 2023-07-08 NOTE — Care Management CC44 (Signed)
 Condition Code 44 Documentation Completed  Patient Details  Name: TANASHIA CIESLA MRN: [id number] Date of Birth: [birth year]   Condition Code 44 given:  Yes Patient signature on Condition Code 44 notice:  Yes Documentation of 2 MD's agreement:  Yes Code 44 added to claim:  Yes    Bing Quarry, RN 07/08/2023, 12:18 PM

## 2023-07-08 NOTE — Discharge Summary (Addendum)
 Physician Discharge Summary   Patient: Meghan Jimenez MRN: [id number] DOB: [birth year]  Admit date:     07/07/2023  Discharge date: 07/08/23  Discharge Physician: Arnetha Courser   PCP: [facility]   Recommendations at discharge:  Please obtain CBC and BMP on follow-up Please repeat chest imaging in 3 to 4 weeks Please consider referring her to see general surgery to explore options for this large hernia. Follow-up with primary care provider within a week  Discharge Diagnoses: Principal Problem:   CAP (community acquired pneumonia) Active Problems:   Pleuritic chest pain   Dyslipidemia   Essential hypertension   Iron deficiency anemia   Depression   GERD without esophagitis   Hospital Course: Taken from H&P.  KRUPA Jimenez is a 63 y.o. female with medical history significant for coronary artery disease, CHF, post hepatitis C liver cirrhosis, COPD, type II diabetes mellitus and essential hypertension, presented to the emergency room with acute onset of right-sided chest pain for the last couple days.  The patient has been having productive cough of yellowish sputum and dyspnea.  She  finished a course of antibiotics 5 days ago for community-acquired pneumonia without improvement.   On presentation stable vital, no hypoxia.  Labs with borderline hyponatremia at 134, negative troponin, lactic acid 1.2, CBC with hemoglobin of 9.4 and platelet of 128.  No leukocytosis.  Respiratory panel negative.  Blood cultures were ordered. CXR with partial medial right middle lobe collapse and consolidation with recommendation for a repeat imaging in 3 to 4 weeks to document resolution if persists she will need a contrast enhanced CT imaging for further evaluation. EKG with NSR, RBBB and Q waves inferiorly.  Due to worsening pleuritic chest pain-CTA chest was also ordered.  3/8: Vitals stable, CTA with no PE, no significant pneumonia or consolidation, right middle lobe volume  loss with atelectasis.  A 4 mm subpleural left upper lobe nodule was seen which is new and need follow-up for incidental pulmonary nodule guidelines.  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  Transverse colon herniated up through the midline epigastric region into the subcutaneous tissue anterior to the lower sternum, no complicating features noted.  Chronic liver cirrhosis.  Procalcitonin negative. Patient with very nonspecific complaints, likely musculoskeletal right-sided chest pain for which lidocaine patch was ordered. Patient was feeling weak so PT evaluation ordered. He was complaining of pruritic rash under breast and at lower abdomen-nystatin powder ordered.  3/9: Patient remained hemodynamically stable.  We discontinued antibiotic as she has been adequately treated for pneumonia.  Blood cultures remain negative.  Chest pain is likely musculoskeletal with some pleuritic component.  She was given lidocaine patches and can continue with her home Toradol tablets. She was also given nystatin powder for intertriginous under breast and lower abdomen.  PT evaluated her and recommended home health services which was ordered.  Patient was also instructed to see outpatient general surgery to explore options for her large hernia involving lower sternal area.  She will continue the rest of her home medications and need to have a close follow-up with her providers for further assistance.   Assessment and Plan: * CAP (community acquired pneumonia) - The patient will be admitted to a medical telemetry bed. - Will continue antibiotic therapy with IV Rocephin and Zithromax. - Mucolytic therapy be provided as well as duo nebs q.i.d. and q.4 hours p.r.n. -Procalcitonin negative-cultures remain negative so antibiotics were discontinued.   Pleuritic chest pain Patient likely  is having post pneumonia pleuritic chest pain also concern of musculoskeletal pain.  Troponin  negative. -Naproxen twice daily added -Lidocaine patch -Supportive care  Dyslipidemia - We will continue statin therapy.  Essential hypertension - We will continue antihypertensive therapy.  GERD without esophagitis - We will continue PPI therapy.  Depression - We will continue her Effexor XR.  Iron deficiency anemia Patient has microcytic anemia, anemia panel with iron deficiency. -Ordered 1 dose of IV iron -P.o. iron supplement for home  Consultants: None Procedures performed: None Disposition: Home health Diet recommendation:  Discharge Diet Orders (From admission, onward)     Start     Ordered   07/08/23 0000  Diet - low sodium heart healthy        07/08/23 1042           Cardiac diet DISCHARGE MEDICATION: Allergies as of 07/08/2023       Reactions   Aspirin Anaphylaxis, Palpitations   Patient takes ibuprofen as home med, so no problem with NSAIDs Pt took aspirin today and no reaction at all 10/03/22   Tramadol Anaphylaxis   Acetaminophen    Patient reports she is supposed to limit intake due to liver issues   Latex    Codeine Rash   Vicodin [hydrocodone-acetaminophen] Palpitations        Medication List     TAKE these medications    albuterol 108 (90 Base) MCG/ACT inhaler Commonly known as: VENTOLIN HFA Inhale 1-2 puffs into the lungs every 4 (four) hours as needed for wheezing or shortness of breath.   atorvastatin 40 MG tablet Commonly known as: LIPITOR Take 1 tablet by mouth daily.   benzonatate 200 MG capsule Commonly known as: TESSALON Take 1 capsule (200 mg total) by mouth 3 (three) times daily as needed for cough.   carvedilol 3.125 MG tablet Commonly known as: COREG Take 3.125 mg by mouth 2 (two) times daily.   diphenhydrAMINE 50 MG capsule Commonly known as: BENADRYL Take 50 mg by mouth every 6 (six) hours as needed.   feeding supplement Liqd Take 237 mLs by mouth 3 (three) times daily between meals.   furosemide 20 MG  tablet Commonly known as: LASIX Take 20 mg by mouth daily as needed for edema or fluid.   hydrOXYzine 25 MG tablet Commonly known as: ATARAX Take 25 mg by mouth 3 (three) times daily.   Iron 325 (65 Fe) MG Tabs Take 1 tablet by mouth once a week.   ketorolac 10 MG tablet Commonly known as: TORADOL Take 1 tablet (10 mg total) by mouth every 6 (six) hours as needed.   lidocaine 5 % Commonly known as: LIDODERM Place 1 patch onto the skin daily. Remove & Discard patch within 12 hours or as directed by MD Start taking on: July 09, 2023   Nitrostat 0.4 MG SL tablet Generic drug: nitroGLYCERIN Place 0.4 mg under the tongue every 5 (five) minutes as needed for chest pain.   nystatin powder Commonly known as: MYCOSTATIN/NYSTOP Apply topically 3 (three) times daily.   pantoprazole 40 MG tablet Commonly known as: PROTONIX Take 40 mg by mouth 2 (two) times daily before a meal.   pregabalin 300 MG capsule Commonly known as: LYRICA Take 300 mg by mouth 2 (two) times daily.   Spiriva HandiHaler 18 MCG inhalation capsule Generic drug: tiotropium Place 1 capsule into inhaler and inhale daily.   spironolactone 50 MG tablet Commonly known as: ALDACTONE Take 50 mg by mouth daily as needed.  venlafaxine XR 150 MG 24 hr capsule Commonly known as: EFFEXOR-XR Take 150 mg by mouth daily.        Discharge Exam: Filed Weights   07/07/23 0028 07/07/23 1556  Weight: 79.4 kg 85.3 kg   General.  Well-developed lady, in no acute distress.  Sternotomy scar with a large hernia involving lower sternal area Pulmonary.  Lungs clear bilaterally, normal respiratory effort. CV.  Regular rate and rhythm, no JVD, rub or murmur. Abdomen.  Soft, nontender, nondistended, BS positive. CNS.  Alert and oriented .  No focal neurologic deficit. Extremities.  No edema, no cyanosis, pulses intact and symmetrical.  Condition at discharge: stable  The results of significant diagnostics from this  hospitalization (including imaging, microbiology, ancillary and laboratory) are listed below for reference.   Imaging Studies: CT Angio Chest Pulmonary Embolism (PE) W or WO Contrast Result Date: 07/07/2023 CLINICAL DATA:  Right-sided chest pain. EXAM: CT ANGIOGRAPHY CHEST WITH CONTRAST TECHNIQUE: Multidetector CT imaging of the chest was performed using the standard protocol during bolus administration of intravenous contrast. Multiplanar CT image reconstructions and MIPs were obtained to evaluate the vascular anatomy. RADIATION DOSE REDUCTION: This exam was performed according to the departmental dose-optimization program which includes automated exposure control, adjustment of the mA and/or kV according to patient size and/or use of iterative reconstruction technique. CONTRAST:  75mL OMNIPAQUE IOHEXOL 350 MG/ML SOLN COMPARISON:  10/03/2022 FINDINGS: Cardiovascular: The heart size is normal. No substantial pericardial effusion. Coronary artery calcification is evident. Mild atherosclerotic calcification is noted in the wall of the thoracic aorta. Status post CABG. There is no filling defect within the opacified pulmonary arteries to suggest the presence of an acute pulmonary embolus. Mediastinum/Nodes: No mediastinal lymphadenopathy. There is no hilar lymphadenopathy. The esophagus has normal imaging features. There is no axillary lymphadenopathy. Lungs/Pleura: Right middle lobe volume loss with atelectasis/scarring. 4 mm subpleural left upper lobe nodule identified on 45/7, new in the interval. Atelectasis noted left lower lobe. No dense consolidative airspace disease to suggest pneumonia. No pleural effusion. Upper Abdomen: Markedly nodular liver contours compatible with cirrhosis. 16 mm left adrenal nodule is stable since prior. Although lesion has attenuation too high to allow classification as an adenoma. This has been present since a CT scan from 09/21/2019 consistent with benign etiology such as lipid  poor adenoma. No followup imaging is recommended. 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 The transverse colon herniates up through the midline epigastric region into the region anterior to the low sternum. No complicating features evident. Review of the MIP images confirms the above findings. IMPRESSION: 1. No CT evidence for acute pulmonary embolus. 2. Right middle lobe volume loss with atelectasis/scarring. 3. 4 mm subpleural left upper lobe nodule, new in the interval. No follow-up needed if patient is low-risk.This recommendation follows the consensus statement: Guidelines for Management of Incidental Pulmonary Nodules Detected on CT Images: From the Fleischner Society 2017; Radiology 2017; 284:228-243. 4. 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 The transverse colon herniates up through the midline epigastric region into the subcutaneous tissues anterior to the low sternum. No complicating features evident. 5. Markedly nodular liver contours compatible with cirrhosis. Electronically Signed   By: Kennith Center M.D.   On: 07/07/2023 08:45   DG Chest 2 View Result Date: 07/07/2023 CLINICAL DATA:  Chest pain EXAM: CHEST - 2 VIEW COMPARISON:  10/14/2022 FINDINGS: There is partial medial right middle lobe collapse and consolidation resulting in opacification of the right cardiophrenic angle. Linear atelectasis  within the right mid lung zone. Lungs are otherwise clear. No pneumothorax or pleural effusion. Coronary artery bypass grafting has been performed. Cardiac size within normal limits. Pulmonary vascularity is normal. No acute bone abnormality. IMPRESSION: 1. Partial medial right middle lobe collapse and consolidation. Follow-up chest radiograph is recommended in 3-4 weeks, following conservative therapy, to document resolution. If persistent at that time, dedicated contrast enhanced CT imaging of the chest is recommended for further evaluation. Electronically  Signed   By: Helyn Numbers M.D.   On: 07/07/2023 01:35    Microbiology: Results for orders placed or performed during the hospital encounter of 07/07/23  Culture, blood (routine x 2)     Status: None (Preliminary result)   Collection Time: 07/07/23  2:00 AM   Specimen: BLOOD  Result Value Ref Range Status   Specimen Description BLOOD RIGHT [facility]  Final   Special Requests   Final    BOTTLES DRAWN AEROBIC AND ANAEROBIC Blood Culture results may not be optimal due to an inadequate volume of blood received in culture bottles   Culture   Final    NO GROWTH 1 DAY Performed at [facility], [address] [place], [place] [address]    Report Status PENDING  Incomplete  Culture, blood (routine x 2)     Status: None (Preliminary result)   Collection Time: 07/07/23  2:38 AM   Specimen: BLOOD  Result Value Ref Range Status   Specimen Description BLOOD LEFT AC  Final   Special Requests   Final    BOTTLES DRAWN AEROBIC AND ANAEROBIC Blood Culture results may not be optimal due to an inadequate volume of blood received in culture bottles   Culture   Final    NO GROWTH 1 DAY Performed at [facility], [address] [place], [place] [address]    Report Status PENDING  Incomplete  Resp panel by RT-PCR (RSV, Flu A&B, Covid) Anterior Nasal Swab     Status: None   Collection Time: 07/07/23  2:38 AM   Specimen: Anterior Nasal Swab  Result Value Ref Range Status   SARS Coronavirus 2 by RT PCR NEGATIVE NEGATIVE Final    Comment: (NOTE) SARS-CoV-2 target nucleic acids are NOT DETECTED.  The SARS-CoV-2 RNA is generally detectable in upper respiratory specimens during the acute phase of infection. The lowest concentration of SARS-CoV-2 viral copies this assay can detect is 138 copies/mL. A negative result does not preclude SARS-Cov-2 infection and should not be used as the sole basis for treatment or other patient management decisions. A negative result may occur with  improper  specimen collection/handling, submission of specimen other than nasopharyngeal swab, presence of viral mutation(s) within the areas targeted by this assay, and inadequate number of viral copies(<138 copies/mL). A negative result must be combined with clinical observations, patient history, and epidemiological information. The expected result is Negative.  Fact Sheet for Patients:  BloggerCourse.com  Fact Sheet for Healthcare Providers:  SeriousBroker.it  This test is no t yet approved or cleared by the [place] FDA and  has been authorized for detection and/or diagnosis of SARS-CoV-2 by FDA under an Emergency Use Authorization (EUA). This EUA will remain  in effect (meaning this test can be used) for the duration of the COVID-19 declaration under Section 564(b)(1) of the Act, 21 U.S.C.section 360bbb-3(b)(1), unless the authorization is terminated  or revoked sooner.       Influenza A by PCR NEGATIVE NEGATIVE Final   Influenza B by PCR NEGATIVE  NEGATIVE Final    Comment: (NOTE) The Xpert Xpress SARS-CoV-2/FLU/RSV plus assay is intended as an aid in the diagnosis of influenza from Nasopharyngeal swab specimens and should not be used as a sole basis for treatment. Nasal washings and aspirates are unacceptable for Xpert Xpress SARS-CoV-2/FLU/RSV testing.  Fact Sheet for Patients: BloggerCourse.com  Fact Sheet for Healthcare Providers: SeriousBroker.it  This test is not yet approved or cleared by the [place] FDA and has been authorized for detection and/or diagnosis of SARS-CoV-2 by FDA under an Emergency Use Authorization (EUA). This EUA will remain in effect (meaning this test can be used) for the duration of the COVID-19 declaration under Section 564(b)(1) of the Act, 21 U.S.C. section 360bbb-3(b)(1), unless the authorization is terminated or revoked.     Resp  Syncytial Virus by PCR NEGATIVE NEGATIVE Final    Comment: (NOTE) Fact Sheet for Patients: BloggerCourse.com  Fact Sheet for Healthcare Providers: SeriousBroker.it  This test is not yet approved or cleared by the [place] FDA and has been authorized for detection and/or diagnosis of SARS-CoV-2 by FDA under an Emergency Use Authorization (EUA). This EUA will remain in effect (meaning this test can be used) for the duration of the COVID-19 declaration under Section 564(b)(1) of the Act, 21 U.S.C. section 360bbb-3(b)(1), unless the authorization is terminated or revoked.  Performed at [facility], [address] [place], [place] [address]     Labs: CBC: Recent Labs  Lab 07/07/23 0033 07/08/23 0628  WBC 6.4 5.0  HGB 9.4* 8.2*  HCT 30.6* 26.5*  MCV 65.1* 64.6*  PLT 128* 109*   Basic Metabolic Panel: Recent Labs  Lab 07/07/23 0033 07/08/23 0628  NA 134* 135  K 3.8 3.9  CL 102 105  CO2 23 25  GLUCOSE 90 122*  BUN 11 15  CREATININE 0.66 0.65  CALCIUM 8.8* 8.0*   Liver Function Tests: No results for input(s): "AST", "ALT", "ALKPHOS", "BILITOT", "PROT", "ALBUMIN" in the last 168 hours. CBG: No results for input(s): "GLUCAP" in the last 168 hours.  Discharge time spent: greater than 30 minutes.  This record has been created using Conservation officer, historic buildings. Errors have been sought and corrected,but may not always be located. Such creation errors do not reflect on the standard of care.   Signed: Arnetha Courser, MD [facility] 07/08/2023

## 2023-07-08 NOTE — Evaluation (Signed)
 Physical Therapy Evaluation Patient Details Name: Meghan Jimenez MRN: [id number] DOB: 1961/03/25 Today's Date: 07/08/2023  History of Present Illness  Patient is a 63 year old with community acquired pneumonia, pleuritic chest pain. History of CAD, CHF, post hepatitis C liver cirrhosis, COPD, type II diabetes mellitus and essential hypertension.  Clinical Impression  PT evaluation completed. Patient is agreeable to PT session. She lives with her son and is usually independent with mobility without device.  Toady the patient is Mod I for bed mobility. Supervision provided for transfers. She walked a short distance in the room with CGA, progressing to supervision. She initially was reaching out for furniture for support and could likely benefit from rolling walker, however she declined. Patient reports mild generalized weakness and fatigue with mobility. Recommend to continue PT to maximize independence and decrease caregiver burden.       If plan is discharge home, recommend the following: Assist for transportation;Help with stairs or ramp for entrance   Can travel by private vehicle        Equipment Recommendations None recommended by PT  Recommendations for Other Services       Functional Status Assessment Patient has had a recent decline in their functional status and demonstrates the ability to make significant improvements in function in a reasonable and predictable amount of time.     Precautions / Restrictions Precautions Precautions: Fall Restrictions Weight Bearing Restrictions Per Provider Order: No      Mobility  Bed Mobility Overal bed mobility: Modified Independent             General bed mobility comments: HOB elevated    Transfers Overall transfer level: Needs assistance Equipment used: None Transfers: Sit to/from Stand, Bed to chair/wheelchair/BSC Sit to Stand: Supervision   Step pivot transfers: Supervision       General transfer comment:  supervision for safety    Ambulation/Gait Ambulation/Gait assistance: Supervision, Contact guard assist Gait Distance (Feet): 20 Feet Assistive device: None Gait Pattern/deviations: Step-through pattern Gait velocity: decreased     General Gait Details: patient declined the need for rolling walker but would likely be more steady as she intermittently reached out for furniture for support. activity tolerance limited by fatigue and patient declined walking further  Stairs            Wheelchair Mobility     Tilt Bed    Modified Rankin (Stroke Patients Only)       Balance Overall balance assessment: Needs assistance Sitting-balance support: Feet supported Sitting balance-Leahy Scale: Good     Standing balance support: No upper extremity supported Standing balance-Leahy Scale: Fair                               Pertinent Vitals/Pain Pain Assessment Pain Assessment: Faces Faces Pain Scale: Hurts a little bit Pain Location: upper chest (chronic per patient report) Pain Descriptors / Indicators: Discomfort Pain Intervention(s): Limited activity within patient's tolerance, Monitored during session    Home Living Family/patient expects to be discharged to:: Private residence Living Arrangements: Children (son and grandson) Available Help at Discharge: Family;Available PRN/intermittently Type of Home: Mobile home Home Access: Stairs to enter Entrance Stairs-Rails: Right Entrance Stairs-Number of Steps: 4   Home Layout: One level Home Equipment: Agricultural consultant (2 wheels)      Prior Function Prior Level of Function : Independent/Modified Independent;Driving             Mobility Comments: no  device for ambulation       Extremity/Trunk Assessment   Upper Extremity Assessment Upper Extremity Assessment: Overall WFL for tasks assessed    Lower Extremity Assessment Lower Extremity Assessment: Generalized weakness (endurance impaired for  sustained activity)       Communication   Communication Communication: No apparent difficulties    Cognition Arousal: Alert Behavior During Therapy: WFL for tasks assessed/performed   PT - Cognitive impairments: No apparent impairments                         Following commands: Intact       Cueing Cueing Techniques: Verbal cues     General Comments General comments (skin integrity, edema, etc.): mild dizziness initially reported with sitting upright that subsides. patient reports generalized weakness with standing    Exercises     Assessment/Plan    PT Assessment Patient needs continued PT services  PT Problem List Decreased strength;Decreased range of motion;Decreased activity tolerance;Decreased balance;Decreased mobility;Decreased safety awareness;Decreased knowledge of precautions       PT Treatment Interventions DME instruction;Gait training;Stair training;Functional mobility training;Therapeutic activities;Therapeutic exercise;Balance training;Neuromuscular re-education;Patient/family education    PT Goals (Current goals can be found in the Care Plan section)  Acute Rehab PT Goals Patient Stated Goal: to go home when ready PT Goal Formulation: With patient Time For Goal Achievement: 07/22/23 Potential to Achieve Goals: Good    Frequency Min 2X/week     Co-evaluation               AM-PAC PT "6 Clicks" Mobility  Outcome Measure Help needed turning from your back to your side while in a flat bed without using bedrails?: None Help needed moving from lying on your back to sitting on the side of a flat bed without using bedrails?: A Little Help needed moving to and from a bed to a chair (including a wheelchair)?: A Little Help needed standing up from a chair using your arms (e.g., wheelchair or bedside chair)?: A Little Help needed to walk in hospital room?: A Little Help needed climbing 3-5 steps with a railing? : A Little 6 Click Score:  19    End of Session   Activity Tolerance: Patient tolerated treatment well Patient left: in bed;with call bell/phone within reach;with bed alarm set Nurse Communication: Mobility status PT Visit Diagnosis: Muscle weakness (generalized) (M62.81);Unsteadiness on feet (R26.81)    Time: 1610-9604 PT Time Calculation (min) (ACUTE ONLY): 17 min   Charges:   PT Evaluation $PT Eval Low Complexity: 1 Low PT Treatments $Therapeutic Activity: 8-22 mins PT General Charges $$ ACUTE PT VISIT: 1 Visit         Donna Bernard, PT, MPT   Ina Homes 07/08/2023, 10:43 AM

## 2023-07-12 LAB — CULTURE, BLOOD (ROUTINE X 2)
Culture: NO GROWTH
Culture: NO GROWTH

## 2023-09-30 ENCOUNTER — Other Ambulatory Visit: Payer: Self-pay

## 2023-09-30 ENCOUNTER — Emergency Department

## 2023-09-30 ENCOUNTER — Emergency Department
Admission: EM | Admit: 2023-09-30 | Discharge: 2023-09-30 | Disposition: A | Discharge status: Home / Self Care | Attending: Emergency Medicine | Admitting: Emergency Medicine

## 2023-09-30 DIAGNOSIS — M25551 Pain in right hip: Secondary | ICD-10-CM | POA: Insufficient documentation

## 2023-09-30 DIAGNOSIS — I110 Hypertensive heart disease with heart failure: Secondary | ICD-10-CM | POA: Insufficient documentation

## 2023-09-30 DIAGNOSIS — E119 Type 2 diabetes mellitus without complications: Secondary | ICD-10-CM | POA: Insufficient documentation

## 2023-09-30 DIAGNOSIS — J449 Chronic obstructive pulmonary disease, unspecified: Secondary | ICD-10-CM | POA: Insufficient documentation

## 2023-09-30 DIAGNOSIS — M25561 Pain in right knee: Secondary | ICD-10-CM | POA: Diagnosis not present

## 2023-09-30 DIAGNOSIS — Y92007 Garden or yard of unspecified non-institutional (private) residence as the place of occurrence of the external cause: Secondary | ICD-10-CM | POA: Insufficient documentation

## 2023-09-30 DIAGNOSIS — S80212A Abrasion, left knee, initial encounter: Secondary | ICD-10-CM | POA: Insufficient documentation

## 2023-09-30 DIAGNOSIS — W57XXXA Bitten or stung by nonvenomous insect and other nonvenomous arthropods, initial encounter: Secondary | ICD-10-CM | POA: Insufficient documentation

## 2023-09-30 DIAGNOSIS — I509 Heart failure, unspecified: Secondary | ICD-10-CM | POA: Insufficient documentation

## 2023-09-30 DIAGNOSIS — I2510 Atherosclerotic heart disease of native coronary artery without angina pectoris: Secondary | ICD-10-CM | POA: Diagnosis not present

## 2023-09-30 DIAGNOSIS — M7918 Myalgia, other site: Secondary | ICD-10-CM

## 2023-09-30 LAB — URINALYSIS, ROUTINE W REFLEX MICROSCOPIC
Bacteria, UA: NONE SEEN
Bilirubin Urine: NEGATIVE
Glucose, UA: NEGATIVE mg/dL
Ketones, ur: NEGATIVE mg/dL
Nitrite: NEGATIVE
Protein, ur: 100 mg/dL — AB
Specific Gravity, Urine: 1.004 — ABNORMAL LOW (ref 1.005–1.030)
pH: 7 (ref 5.0–8.0)

## 2023-09-30 LAB — CBC
HCT: 36 % (ref 36.0–46.0)
Hemoglobin: 10.6 g/dL — ABNORMAL LOW (ref 12.0–15.0)
MCH: 20.1 pg — ABNORMAL LOW (ref 26.0–34.0)
MCHC: 29.4 g/dL — ABNORMAL LOW (ref 30.0–36.0)
MCV: 68.3 fL — ABNORMAL LOW (ref 80.0–100.0)
Platelets: 174 10*3/uL (ref 150–400)
RBC: 5.27 MIL/uL — ABNORMAL HIGH (ref 3.87–5.11)
RDW: 18.6 % — ABNORMAL HIGH (ref 11.5–15.5)
WBC: 7 10*3/uL (ref 4.0–10.5)
nRBC: 0 % (ref 0.0–0.2)

## 2023-09-30 LAB — BASIC METABOLIC PANEL WITH GFR
Anion gap: 7 (ref 5–15)
BUN: 9 mg/dL (ref 8–23)
CO2: 24 mmol/L (ref 22–32)
Calcium: 8.8 mg/dL — ABNORMAL LOW (ref 8.9–10.3)
Chloride: 104 mmol/L (ref 98–111)
Creatinine, Ser: 0.46 mg/dL (ref 0.44–1.00)
GFR, Estimated: >60 mL/min (ref >60)
Glucose, Bld: 107 mg/dL — ABNORMAL HIGH (ref 70–99)
Potassium: 3.3 mmol/L — ABNORMAL LOW (ref 3.5–5.1)
Sodium: 135 mmol/L (ref 135–145)

## 2023-09-30 MED ORDER — SODIUM CHLORIDE 0.9 % IV BOLUS: 1000.0000 mL | Freq: Once | INTRAVENOUS | Status: DC

## 2023-09-30 MED ORDER — NAPROXEN 500 MG PO TABS
500.0000 mg | ORAL_TABLET | Freq: Once | ORAL | Status: AC
  Administered 2023-09-30: 500 mg via ORAL
  Filled 2023-09-30: qty 1

## 2023-09-30 NOTE — ED Triage Notes (Addendum)
 Pt arrived from home via POV c/o fall in yard last night. C/o lower right back pain 10/10. Pt states that she believes that she was bit by a spider while she was on the ground. Pt noted to have an open area on the posterior surface of left knee. 10/10. Pt states that she hurts all over from the fall. Pt also states that she thinks she saw some blood in her urine as well.

## 2023-09-30 NOTE — Discharge Instructions (Signed)
 Your lab tests and x-rays today were all okay.  Please continue taking your medication and follow-up with your doctor for further evaluation of your symptoms.

## 2023-09-30 NOTE — ED Provider Notes (Signed)
 [facility] Provider Note    Event Date/Time   First MD Initiated Contact with Patient 09/30/23 2007     (approximate)   History   Chief Complaint: Fall   HPI  Meghan Jimenez is a 63 y.o. female with a history of CAD cirrhosis COPD diabetes hypertension polysubstance abuse who comes ED complaining of fall in her yard.  Reports that her leg gave out.  She does have a history of chronic musculoskeletal pain and neuropathy.  Reports that she was on the ground for about 30 minutes when a neighbor came to help her.  Complains of pain in her right hip, right knee.  Also is worried that she was bitten by a spider in her left popliteal fossa.  Denies head injury or neck pain or loss of consciousness.  No acute chest pain or shortness of breath or belly pain or other recent illnesses.        Past Medical History:  Diagnosis Date   CAD (coronary artery disease)    CHF (congestive heart failure) (HCC)    Cirrhosis (HCC)    COPD (chronic obstructive pulmonary disease) (HCC)    Diabetes mellitus without complication (HCC)    Hepatitis C    Hypertension     Current Outpatient Rx   Order #: [id number] Class: Historical Med   Order #: [id number] Class: Historical Med   Order #: [id number] Class: Normal   Order #: [id number] Class: Historical Med   Order #: [id number] Class: Historical Med   Order #: [id number] Class: Print   Order #: [id number] Class: Historical Med   Order #: [id number] Class: Historical Med   Order #: [id number] Class: Historical Med   Order #: [id number] Class: Print   Order #: [id number] Class: Normal   Order #: [id number] Class: Historical Med   Order #: [id number] Class: Normal   Order #: [id number] Class: Historical Med   Order #: [id number] Class: Historical Med   Order #: [id number] Class: Historical Med   Order #: [id number] Class: Historical Med   Order #: [id number] Class: Historical Med    Past Surgical History:  Procedure Laterality Date   BACK SURGERY      CARDIAC SURGERY     ESOPHAGOGASTRODUODENOSCOPY (EGD) WITH PROPOFOL  N/A 12/11/2015   Procedure: ESOPHAGOGASTRODUODENOSCOPY (EGD) WITH PROPOFOL ;  Surgeon: Odella Bending, MD;  Location: [facility] ENDOSCOPY;  Service: Gastroenterology;  Laterality: N/A;   LEFT HEART CATH AND CORONARY ANGIOGRAPHY N/A 11/13/2016   Procedure: Left Heart Cath and Coronary Angiography;  Surgeon: Michelle Aid, MD;  Location: [facility] INVASIVE CV LAB;  Service: Cardiovascular;  Laterality: N/A;    Physical Exam   Triage Vital Signs: ED Triage Vitals  Encounter Vitals Group     BP 09/30/23 1953 113/82     Systolic BP Percentile --      Diastolic BP Percentile --      Pulse Rate 09/30/23 1953 72     Resp 09/30/23 1953 18     Temp 09/30/23 1953 98.1 F (36.7 C)     Temp Source 09/30/23 1953 Oral     SpO2 09/30/23 1953 99 %     Weight 09/30/23 1938 200 lb (90.7 kg)     Height 09/30/23 1938 5\' 4"  (1.626 m)     Head Circumference --      Peak Flow --      Pain Score 09/30/23 1954 10     Pain Loc --      Pain Education --      Exclude from Growth Chart --     Most  recent vital signs: Vitals:   09/30/23 1953 09/30/23 2130  BP: 113/82 (!) 176/93  Pulse: 72 96  Resp: 18 15  Temp: 98.1 F (36.7 C)   SpO2: 99% 100%    General: Awake, no distress.  CV:  Good peripheral perfusion.  Regular rate rhythm Resp:  Normal effort.  Clear to auscultation bilaterally Abd:  No distention.  Soft nontender Other:  Tenderness at the right hip without deformity.  Intact range of motion.  Tenderness at the right knee, intact range of motion.  There are some mild swelling at the right knee.  Left popliteal fossa has a 1 cm area of skin excoriation without signs of infection.  No fluctuance induration or crepitus. Moving all extremities.  No drift.  Normal speech and language.  Normal cerebellar function.  NIH stroke scale 0   ED Results / Procedures / Treatments   Labs (all labs ordered are listed, but only abnormal results  are displayed) Labs Reviewed  URINALYSIS, ROUTINE W REFLEX MICROSCOPIC - Abnormal; Notable for the following components:      Result Value   Color, Urine STRAW (*)    APPearance CLEAR (*)    Specific Gravity, Urine 1.004 (*)    Hgb urine dipstick SMALL (*)    Protein, ur 100 (*)    Leukocytes,Ua TRACE (*)    All other components within normal limits  BASIC METABOLIC PANEL WITH GFR - Abnormal; Notable for the following components:   Potassium 3.3 (*)    Glucose, Bld 107 (*)    Calcium  8.8 (*)    All other components within normal limits  CBC - Abnormal; Notable for the following components:   RBC 5.27 (*)    Hemoglobin 10.6 (*)    MCV 68.3 (*)    MCH 20.1 (*)    MCHC 29.4 (*)    RDW 18.6 (*)    All other components within normal limits     EKG    RADIOLOGY X-ray right hip and pelvis interpreted by me, negative for fracture or dislocation.  Radiology report reviewed   PROCEDURES:  Procedures   MEDICATIONS ORDERED IN ED: Medications  naproxen  (NAPROSYN ) tablet 500 mg (500 mg Oral Given 09/30/23 2117)     IMPRESSION / MDM / ASSESSMENT AND PLAN / ED COURSE  I reviewed the triage vital signs and the nursing notes.  DDx: Hip fracture, pelvis fracture, right knee fracture, AKI, electrolyte derangement, anemia, UTI, chronic neuropathy/chronic pain  Patient's presentation is most consistent with acute presentation with potential threat to life or bodily function.  Patient presents with multiple pain complaints, reports falling in her yard yesterday.  Pain and exam seem to localize to the right hip and right knee, but exam is also inconsistent as is her HPI.  Labs are reassuring.  X-ray right hip and pelvis negative, with her pain complaints will obtain CT pelvis along with x-ray of the right knee.   ----------------------------------------- 10:20 PM on 09/30/2023 ----------------------------------------- Imaging all reassuring.  Stable for discharge.      FINAL  CLINICAL IMPRESSION(S) / ED DIAGNOSES   Final diagnoses:  Musculoskeletal pain     Rx / DC Orders   ED Discharge Orders     None        Note:  This document was prepared using Dragon voice recognition software and may include unintentional dictation errors.   Jacquie Maudlin, MD 09/30/23 2220

## 2023-09-30 NOTE — ED Notes (Signed)
 Physician is in patient room at this time explaining her discharge.

## 2023-10-09 ENCOUNTER — Emergency Department: Admission: EM | Admit: 2023-10-09 | Discharge: 2023-10-09 | Discharge status: Against Medical Advice

## 2023-12-03 NOTE — ED Notes (Signed)
 Pt discharged to home. AVS reviewed with patient. All questions answered. Pt verbalized understanding of discharge instructions. Pt VSS at time of discharge.

## 2024-01-13 ENCOUNTER — Encounter: Payer: Self-pay | Admitting: Emergency Medicine

## 2024-01-13 ENCOUNTER — Emergency Department
Admission: EM | Admit: 2024-01-13 | Discharge: 2024-01-13 | Disposition: A | Discharge status: Home / Self Care | Attending: Emergency Medicine | Admitting: Emergency Medicine

## 2024-01-13 ENCOUNTER — Emergency Department

## 2024-01-13 ENCOUNTER — Other Ambulatory Visit: Payer: Self-pay

## 2024-01-13 DIAGNOSIS — R0602 Shortness of breath: Secondary | ICD-10-CM | POA: Diagnosis not present

## 2024-01-13 DIAGNOSIS — R0789 Other chest pain: Secondary | ICD-10-CM | POA: Diagnosis present

## 2024-01-13 DIAGNOSIS — R509 Fever, unspecified: Secondary | ICD-10-CM | POA: Diagnosis not present

## 2024-01-13 DIAGNOSIS — K439 Ventral hernia without obstruction or gangrene: Secondary | ICD-10-CM | POA: Diagnosis not present

## 2024-01-13 LAB — BASIC METABOLIC PANEL WITH GFR
Anion gap: 10 (ref 5–15)
BUN: 13 mg/dL (ref 8–23)
CO2: 23 mmol/L (ref 22–32)
Calcium: 8.6 mg/dL — ABNORMAL LOW (ref 8.9–10.3)
Chloride: 101 mmol/L (ref 98–111)
Creatinine, Ser: 0.65 mg/dL (ref 0.44–1.00)
GFR, Estimated: >60 mL/min (ref >60)
Glucose, Bld: 191 mg/dL — ABNORMAL HIGH (ref 70–99)
Potassium: 3.8 mmol/L (ref 3.5–5.1)
Sodium: 134 mmol/L — ABNORMAL LOW (ref 135–145)

## 2024-01-13 LAB — CBC
HCT: 33.2 % — ABNORMAL LOW (ref 36.0–46.0)
Hemoglobin: 10.2 g/dL — ABNORMAL LOW (ref 12.0–15.0)
MCH: 20.4 pg — ABNORMAL LOW (ref 26.0–34.0)
MCHC: 30.7 g/dL (ref 30.0–36.0)
MCV: 66.5 fL — ABNORMAL LOW (ref 80.0–100.0)
Platelets: 149 K/uL — ABNORMAL LOW (ref 150–400)
RBC: 4.99 MIL/uL (ref 3.87–5.11)
RDW: 20.2 % — ABNORMAL HIGH (ref 11.5–15.5)
WBC: 5.6 K/uL (ref 4.0–10.5)
nRBC: 0 % (ref 0.0–0.2)

## 2024-01-13 LAB — RESP PANEL BY RT-PCR (RSV, FLU A&B, COVID)  RVPGX2
Influenza A by PCR: NEGATIVE
Influenza B by PCR: NEGATIVE
Resp Syncytial Virus by PCR: NEGATIVE
SARS Coronavirus 2 by RT PCR: NEGATIVE

## 2024-01-13 LAB — URINALYSIS, ROUTINE W REFLEX MICROSCOPIC
Bilirubin Urine: NEGATIVE
Glucose, UA: NEGATIVE mg/dL
Ketones, ur: NEGATIVE mg/dL
Leukocytes,Ua: NEGATIVE
Nitrite: NEGATIVE
Protein, ur: >=300 mg/dL — AB
Specific Gravity, Urine: 1.011 (ref 1.005–1.030)
pH: 6 (ref 5.0–8.0)

## 2024-01-13 LAB — TROPONIN I (HIGH SENSITIVITY)
Troponin I (High Sensitivity): 8 ng/L (ref <18)
Troponin I (High Sensitivity): 7 ng/L (ref <18)

## 2024-01-13 MED ORDER — IOHEXOL 350 MG/ML SOLN
100.0000 mL | Freq: Once | INTRAVENOUS | Status: AC | PRN
  Administered 2024-01-13: 100 mL via INTRAVENOUS

## 2024-01-13 MED ORDER — ONDANSETRON 4 MG PO TBDP
4.0000 mg | ORAL_TABLET | Freq: Three times a day (TID) | ORAL | 0 refills | Status: DC | PRN
Start: 2024-01-13 — End: 2024-01-13

## 2024-01-13 MED ORDER — ONDANSETRON HCL 4 MG/2ML IJ SOLN
4.0000 mg | Freq: Once | INTRAMUSCULAR | Status: AC
  Administered 2024-01-13: 4 mg via INTRAVENOUS
  Filled 2024-01-13: qty 2

## 2024-01-13 MED ORDER — IPRATROPIUM-ALBUTEROL 0.5-2.5 (3) MG/3ML IN SOLN
3.0000 mL | Freq: Once | RESPIRATORY_TRACT | Status: AC
  Administered 2024-01-13: 3 mL via RESPIRATORY_TRACT
  Filled 2024-01-13: qty 3

## 2024-01-13 MED ORDER — FENTANYL CITRATE PF 50 MCG/ML IJ SOSY
50.0000 ug | PREFILLED_SYRINGE | Freq: Once | INTRAMUSCULAR | Status: AC
  Administered 2024-01-13: 50 ug via INTRAVENOUS
  Filled 2024-01-13: qty 1

## 2024-01-13 MED ORDER — ONDANSETRON 4 MG PO TBDP: 4.0000 mg | ORAL_TABLET | Freq: Three times a day (TID) | ORAL | 0 refills | Status: DC | PRN

## 2024-01-13 MED ORDER — KETOROLAC TROMETHAMINE 15 MG/ML IJ SOLN
15.0000 mg | Freq: Once | INTRAMUSCULAR | Status: AC
  Administered 2024-01-13: 15 mg via INTRAVENOUS
  Filled 2024-01-13: qty 1

## 2024-01-13 MED ORDER — SODIUM CHLORIDE 0.9 % IV BOLUS
1000.0000 mL | Freq: Once | INTRAVENOUS | Status: AC
  Administered 2024-01-13: 1000 mL via INTRAVENOUS

## 2024-01-13 MED ORDER — ONDANSETRON 4 MG PO TBDP: 4.0000 mg | ORAL_TABLET | Freq: Three times a day (TID) | ORAL | 0 refills | Status: AC | PRN

## 2024-01-13 NOTE — ED Notes (Signed)
 Pt given her clothing to change into. Pt changed independently with this tech in the rm.

## 2024-01-13 NOTE — ED Provider Notes (Signed)
 [facility] Provider Note    Event Date/Time   First MD Initiated Contact with Patient 01/13/24 1849     (approximate)   History   Shortness of Breath   HPI  Meghan Jimenez is a 63 y.o. female who presented to the emergency department today with primary concern for shortness of breath.  She states that she has been feeling sick for about 3 weeks. She has had some associated chest pain and weakness. She has had fevers. States that a grandchild was diagnosed with covid. Also complains of continued hernia to her upper abdomen.      Physical Exam   Triage Vital Signs: ED Triage Vitals  Encounter Vitals Group     BP 01/13/24 1601 (!) 150/74     Girls Systolic BP Percentile --      Girls Diastolic BP Percentile --      Boys Systolic BP Percentile --      Boys Diastolic BP Percentile --      Pulse Rate 01/13/24 1601 (!) 111     Resp 01/13/24 1601 17     Temp 01/13/24 1601 99 F (37.2 C)     Temp Source 01/13/24 1601 Oral     SpO2 01/13/24 1601 94 %     Weight 01/13/24 1602 182 lb (82.6 kg)     Height 01/13/24 1602 5' 4 (1.626 m)     Head Circumference --      Peak Flow --      Pain Score 01/13/24 1613 10     Pain Loc --      Pain Education --      Exclude from Growth Chart --     Most recent vital signs: Vitals:   01/13/24 1601  BP: (!) 150/74  Pulse: (!) 111  Resp: 17  Temp: 99 F (37.2 C)  SpO2: 94%   General: Awake, alert, oriented. CV:  Good peripheral perfusion. Tachycardia. Resp:  Normal effort. Lungs clear. Abd:  No distention.  Other:  Large hernia to epigastric region, mild tenderness, no erythema.    ED Results / Procedures / Treatments   Labs (all labs ordered are listed, but only abnormal results are displayed) Labs Reviewed  BASIC METABOLIC PANEL WITH GFR - Abnormal; Notable for the following components:      Result Value   Sodium 134 (*)    Glucose, Bld 191 (*)    Calcium  8.6 (*)    All other components within  normal limits  CBC - Abnormal; Notable for the following components:   Hemoglobin 10.2 (*)    HCT 33.2 (*)    MCV 66.5 (*)    MCH 20.4 (*)    RDW 20.2 (*)    Platelets 149 (*)    All other components within normal limits  URINALYSIS, ROUTINE W REFLEX MICROSCOPIC  TROPONIN I (HIGH SENSITIVITY)  TROPONIN I (HIGH SENSITIVITY)     EKG  I, Guadalupe Eagles, attending physician, personally viewed and interpreted this EKG  EKG Time: 1607 Rate: 108 Rhythm: sinus tachycardia Axis: normal  Intervals: qtc 511 QRS: RBBB ST changes: no st elevation Impression: abnormal ekg  RADIOLOGY I independently interpreted and visualized the CXR. My interpretation: No pneumonia Radiology interpretation:  IMPRESSION:  No active cardiopulmonary disease.   I independently interpreted and visualized the CTAPE. My interpretation: No PE Radiology interpretation:  IMPRESSION:  No evidence of pulmonary emboli.    Resolution of previously seen left upper lobe nodule.  Stable hernia in the epigastric region containing a loop of  transverse colon. No obstructive changes are seen.    Stable changes of cirrhosis and splenomegaly.    Stable diastasis of the prior median sternotomy.    Aortic Atherosclerosis (ICD10-I70.0).    I independently interpreted and visualized the CT abd/pel. My interpretation: large abdominal wall hernia without obstruction Radiology interpretation:  IMPRESSION:  1. Epigastric ventral hernia, incompletely visualized, containing a partially  visualized loop of mid transverse colon. The hernia defect measures at least  4.9 x 5.9 cm.  2. Cirrhosis with splenorenal shunt, consistent with portal venous  hypertension. No enhancing intrahepatic mass or biliary ductal dilation.  3. 3 mm nonobstructing calculus in the left kidney. No ureteral calculi or  hydronephrosis.  4. No acute findings.     PROCEDURES:  Critical Care performed: No    MEDICATIONS ORDERED IN  ED: Medications - No data to display   IMPRESSION / MDM / ASSESSMENT AND PLAN / ED COURSE  I reviewed the triage vital signs and the nursing notes.                              Differential diagnosis includes, but is not limited to, viral infection, pneumonia, COPD, CHF, PE, ACS, anemia  Patient's presentation is most consistent with acute presentation with potential threat to life or bodily function.   The patient is on the cardiac monitor to evaluate for evidence of arrhythmia and/or significant heart rate changes.  Patient presented to the emergency department with primary concern for shortness of breath although patient has.  Other complaints.  On exam patient is tachycardic.  Patient is not hypoxic.  No lower extremity edema.  Chest x-ray did not show any pneumonia.  Initial troponin negative.  Patient is slightly anemic however similar to what the patient had on previous check.  Will check COVID.  Will give breathing treatment.  CT scans do show hernia although no findings concerning for obstruction or incarceration.  Additionally CT angio did not show PE or concerning pneumonia.  COVID and flu negative.  At this time workup is reassuring.  At this time I do not think patient necessitates inpatient admission do think she would benefit from outpatient follow-up.  I discussed this with the patient.  She states she is not happy with her current primary care.  Will give patient primary care referral as well as wound care referral for small wound on chest.  Additionally will give patient surgery referral given hernia.      FINAL CLINICAL IMPRESSION(S) / ED DIAGNOSES   Final diagnoses:  Hernia of abdominal wall     Note:  This document was prepared using Dragon voice recognition software and may include unintentional dictation errors.    Floy Roberts, MD 01/13/24 [id number]

## 2024-01-13 NOTE — Discharge Instructions (Addendum)
 Please be sure to contact the surgery clinic to follow up your hernia.

## 2024-01-13 NOTE — ED Triage Notes (Addendum)
 Pt reports chest pain, fever, cough, and SHOB. Pt also reports a hernia that has been sticking out for 3 weeks.

## 2024-01-22 NOTE — ED Provider Notes (Signed)
 [facility] EMERGENCY DEPT  ED Provider Note History   Chief Complaint  Patient presents with  . Chest Pain   History of Present Illness  Ms. Meghan Jimenez is a 63 year old female with a past medical history most notable for CAD s/p CABG and sternotomy C/B wound nonunion and hiatal/epigastric hernia, COPD on no baseline O2, T2DM, SUD history presenting with multiple complaints.  Patient is primarily presenting with pain all over.  Patient says she has had pain all over for 5 years and there is no change today.  She notes that she does have chest pain with radiation to bilateral upper extremities which is not exertional and not postprandial.  She endorses associated shortness of breath.  She additionally endorses headache for 1 month and abdominal pain at the site of her epigastric hernia.  No nausea, vomiting, dysuria, hematuria.  Patient additionally notes dysphagia for 3 days after taking her amlodipine  and having the sensation that the pill got stuck in her esophagus.  She states she continues to have esophageal pain and has barely eaten or drank due to pain and inability to keep food and water down.  She has not taken any of her pills during this time.  Level of Interpreter Services: No interpreter needed (no language barrier)  Past Medical History:  Diagnosis Date  . Anxiety   . Chronic back pain   . Chronic neck pain   . Cirrhosis of liver (CMS/HHS-HCC) 11/14/2016  . COPD (chronic obstructive pulmonary disease) (CMS/HHS-HCC)   . DDD (degenerative disc disease), cervical   . DDD (degenerative disc disease), lumbar   . Depression   . Dermatitis   . Diabetes mellitus, type 2 (CMS/HHS-HCC)   . Esophageal stricture   . External hemorrhoids   . GERD (gastroesophageal reflux disease)   . Hepatitis C   . History of alcohol abuse 12/15/2010  . History of cocaine abuse (CMS/HHS-HCC) 12/15/2010  . History of coronary artery bypass graft x 1 11/17/2016  . HTN (hypertension)   . Hypokalemia   .  Hypothyroidism   . Joint pain   . Polysubstance abuse (CMS/HHS-HCC) 02/20/2017   PMH of polysubstance abuse    . Pulmonary emphysema (CMS/HHS-HCC)   . Restless leg syndrome   . Sternal pain 02/28/2018   Past Surgical History:  Procedure Laterality Date  . CORONARY ARTERY BYPASS W/SINGLE ARTERY GRAFT N/A 11/17/2016   Procedure: CORONARY ARTERY BYPASS, USING ARTERIAL GRAFT(S); SINGLE ARTERIAL GRAFT, Left internal mammary artery harvest;  Surgeon: Norleen Dow Exon, MD;  Location: [facility];  Service: Cardiothoracic;  Laterality: N/A;  . HARVEST LEFT INTERNAL MAMMARY ARTERY FOR CABG Left 11/17/2016   Procedure: HARVEST LEFT INTERNAL MAMMARY ARTERY FOR CABG;  Surgeon: Norleen Dow Exon, MD;  Location: [facility];  Service: Cardiothoracic;  Laterality: Left;  . INCISION & DRAINAGE SEROMA CHEST N/A 12/06/2016   Procedure: INCISION AND DRAINAGE OF HEMATOMA, SEROMA OR FLUID COLLECTION; CHEST, s/p cabg 11/17/16;  Surgeon: Exon Norleen Dow, MD;  Location: [facility];  Service: Cardiothoracic;  Laterality: N/A;  . APPLICATION WOUND VAC N/A 12/06/2016   Procedure: NEGATIVE PRESSURE WOUND THERAPY, INCLUDING TOPICAL APPLICATION(S), ASSESSMENT, INSTRUCTION(S) FOR ONGOING CARE, PER SESSION; TOTAL WOUND(S) SURFACE AREA LESS THAN OR EQUAL TO 50 SQUARE CENTIMETERS;  Surgeon: Exon Norleen Dow, MD;  Location: [facility];  Service: Cardiothoracic;  Laterality: N/A;  . CHEST TUBE PLACEMENT Left 12/06/2016   Procedure: TUBE THORACOSTOMY, INCLUDES CONNECTION TO DRAINAGE SYSTEM (EG, WATER SEAL), WHEN PERFORMED, OPEN (SEPARATE PROCEDURE);  Surgeon: Exon,  Norleen Don, MD;  Location: [facility];  Service: Cardiothoracic;  Laterality: Left;  . FLAP MYOCUTANEOUS/FASCIOCUTANEOUS CHEST N/A 12/08/2016   Procedure: MUSCLE, MYOCUTANEOUS, OR FASCIOCUTANEOUS FLAP; PECTORALIS, s/p cabg 11/17/16, s/p I and D sternal wound 12/06/16;  Surgeon: Norleen Don Exon, MD;  Location: [facility];  Service: Cardiothoracic;  Laterality: N/A;  . EXPLORATION CHEST FOR POSTOPERATIVE THROMBOSIS/INFECTION THORACOTOMY N/A 01/05/2017   Procedure: EXPLORATION CHEST FOR POSTOPERATIVE HEMORRHAGE, THROMBOSIS OR INFECTION THORACOTOMY;  Surgeon: Exon Norleen Don, MD;  Location: [facility];  Service: Cardiothoracic;  Laterality: N/A;  . APPLICATION WOUND VAC N/A 01/05/2017   Procedure: NEGATIVE PRESSURE WOUND THERAPY, INCLUDING TOPICAL APPLICATION(S), ASSESSMENT, INSTRUCTION(S) FOR ONGOING CARE, PER SESSION; TOTAL WOUND(S) SURFACE AREA LESS THAN OR EQUAL TO 50 SQUARE CENTIMETERS;  Surgeon: Exon Norleen Don, MD;  Location: [facility];  Service: Cardiothoracic;  Laterality: N/A;  . OMENTAL FLAP N/A 01/08/2017   Procedure: OMENTAL FLAP, EXTRA - ABDOMINAL (EG, FOR RECONSTRUCTION OF STERNAL AND CHEST WALL DEFECTS);  Surgeon: Exon Norleen Don, MD;  Location: [facility];  Service: Cardiothoracic;  Laterality: N/A;  . INCISION & DRAINAGE POSTOP WOUND INFECTION CHEST N/A 03/03/2017   Procedure: INCISION AND DRAINAGE, COMPLEX, POSTOPERATIVE WOUND INFECTION; CHEST;  Surgeon: Exon Norleen Don, MD;  Location: [facility];  Service: Cardiothoracic;  Laterality: N/A;  . EXTRACTION TEETH N/A 05/18/2017   Procedure: EXTRACTION TEETH:#22, #23, #24, #25, #26, #27 remaining mandibualr dentition;  Surgeon: Celestia Addie Blizzard, DDS;  Location: [facility];  Service: Plastic Surgery;  Laterality: N/A;  . CERVICAL SPINE SURGERY    . CHOLECYSTECTOMY    . Corrective nose surgery secondary to fracture    . ESOPHAGEAL DILATION    . LUMBAR SPINE SURGERY    . Peritoneal abdominal laparoscopy    . TUBAL LIGATION     Family History  Problem Relation Age of Onset  . High blood pressure (Hypertension) Mother   . Myocardial Infarction (Heart attack) Mother   . Stroke Mother   . Diabetes type II Mother   . Cancer Father    Social History   Socioeconomic History  . Marital status:  Widowed  Tobacco Use  . Smoking status: Every Day  . Smokeless tobacco: Never  . Tobacco comments:    2 sticks a day  Substance and Sexual Activity  . Alcohol use: No    Alcohol/week: 0.0 standard drinks of alcohol  . Drug use: No    Comment: quit cocaine in 2017  . Sexual activity: Defer  Social History Narrative   ** Merged History Encounter **       Social Drivers of Health   Financial Resource Strain: High Risk (02/06/2023)   Received from [facility]   Overall Financial Resource Strain (CARDIA)   . Difficulty of Paying Living Expenses: Very hard  Food Insecurity: No Food Insecurity (07/07/2023)   Received from [facility]   Hunger Vital Sign   . Within the past 12 months, you worried that your food would run out before you got the money to buy more.: Never true   . Within the past 12 months, the food you bought just didn't last and you didn't have money to get more.: Never true  Transportation Needs: No Transportation Needs (07/07/2023)   Received from [facility] - Transportation   . Lack of Transportation (Medical): No   . Lack of Transportation (Non-Medical): No  Housing Stability: High Risk (02/06/2023)   Received from [facility]   Housing   . Within the past 12 months, have you ever stayed: outside, in a car, in a tent, in an overnight shelter, or temporarily in someone else's home(i.e.couch-surfing)?: No   . Are you worried about losing your housing?: Yes   Review of Systems per HPI  Physical Exam  BP (!) 169/74   Pulse 91   Temp 36.1 C (97 F) (Tympanic)   Resp 20   SpO2 95%  Physical Exam Vitals and nursing note reviewed.  Constitutional:      General: She is not in acute distress.    Appearance: She is well-developed.  HENT:     Head: Normocephalic and atraumatic.  Eyes:     Conjunctiva/sclera: Conjunctivae normal.  Cardiovascular:     Rate and Rhythm: Normal rate and regular rhythm.     Pulses:          Radial pulses are 2+ on the  right side and 2+ on the left side.       Dorsalis pedis pulses are 2+ on the right side and 2+ on the left side.     Heart sounds: No murmur heard. Pulmonary:     Effort: Pulmonary effort is normal. Tachypnea present. No respiratory distress.     Breath sounds: Normal breath sounds.     Comments: Upper airway breath sounds, very mild diffuse expiratory wheezing Chest:     Comments: Medial sternal wound nonunion with no surrounding erythema or purulence Abdominal:     Palpations: Abdomen is soft.     Tenderness: There is no abdominal tenderness.     Comments: Epigastric hernia easily reducible  Musculoskeletal:        General: No swelling.     Cervical back: Neck supple.     Right lower leg: No edema.     Left lower leg: No edema.  Skin:    General: Skin is warm and dry.     Capillary Refill: Capillary refill takes less than 2 seconds.  Neurological:     Mental Status: She is alert.  Psychiatric:        Mood and Affect: Mood normal.     Medical Decision Making   Medical Complexity:    New and requires workup.     Pertinent labs & imaging results that were available during my care of the patient were reviewed by me and considered in my medical decision making.     I reviewed previous medical records.     I independently visualized image(s), tracing(s), and/or specimen(s).    Ms. Meghan Jimenez is a 63 year old female with a past medical history of CAD s/p CABG complicated by chronic sternotomy wound dehiscence and hiatal/epigastric hernia, HTN, cirrhosis, T2DM presenting with chronic diffuse pain particularly in the chest as well as dysphagia for 3 days.  She is afebrile and hypertensive to systolic 190s with otherwise normal vital signs in the ED.  Physical exam notable for mildly increased work of breathing and mild diffuse end expiratory wheezing without crackles or rails and a reducible epigastric hernia.  Low concern for incarcerated or strangulated hernia at this time but no other  abdominal pain to suggest intra-abdominal infection or obstruction.  In terms of chest pain, CXR reassuring against pneumothorax though does note right lower lobe opacity with associated pleural effusion, given no new infectious symptoms will hold on treating with antibiotics until CBC results.  Initially reassured against ACS with nonischemic EKG and initially flat troponin pending repeat.  Patient is  not diffusely volume overloaded and has normal proBNP reassuring against CHF exacerbation.  Lower concern for PE with normal heart rate and oxygenation.  Patient does have new RBBB compared to 01/02/2024 though on further chart review has had RBBB in the past.  In terms of dysphagia, patient has been able to eat and drink a little bit so low concern for obstruction at this time.  Attempted to give water which patient was unable to drink on bedside swallow screen.  Given persistent dysphagia and particularly inability to take any of her medications leading to significant hypertension, will likely need to be admitted to the hospital for further workup.  Final disposition pending further lab workup.  Differential Diagnosis (Differential DX)   Diagnoses that have been ruled out:  None  Diagnoses that are still under consideration:  None  Final diagnoses:  Chest pain, unspecified type    ED Course as of 01/22/24 2339  Tue Jan 22, 2024  2302 SO: Possible gen med admit 89 yof hx of prior CABG w/ sternotomy, chronic chest pain radiates b/l upper extremity, large epigastric hernia, no acute complaints but chest pain. Hypertensive's not taking medications. Failed swallow study. [TW]  2304 X-ray chest PA and lateral Impression: Right-sided pulmonary opacities with a right-sided pleural effusion concerning for infection, or aspiration.  [TW]    ED Course User Index [TW] [name] Lani Sharper, MD      Medications Administered in the Emergency Department   lidocaine  (XYLOCAINE ) 1 % injection 0.5 mL  (has no administration in time range) ketorolac  (TORADOL ) injection 15 mg (15 mg Intravenous Given 01/22/24 2253) ondansetron  (PF) (ZOFRAN ) injection 4 mg (4 mg Intravenous Given 01/22/24 2328)   ED Clinical Impression  1. Chest pain, unspecified type                   Norma Dorien Sic, MD Resident 01/22/24 [phone]

## 2024-01-22 NOTE — ED Notes (Signed)
 Assumed care of patient. Patient presents to ED for multiple complaints. Pt reports her biggest concern today is CP. Pt endorses CP and SOB. Pt reports CP x2 months and elevated BP. Pt reports missing sternum d/t previous sx. Pt noticed to have indentation in sternum with an abrasion that is leaking serosanguineous fluid. Pt reports fevers at home but is afebrile. Patient is attached to continuous cardiac and pulse ox monitoring. Even and unlabored respirations noted. Equal rise and fall of chest bilaterally. No distress noted. Patient denies needs at this time. Call bell within reach.  Past Medical History:  Diagnosis Date  . Anxiety   . Chronic back pain   . Chronic neck pain   . Cirrhosis of liver (CMS/HHS-HCC) 11/14/2016  . COPD (chronic obstructive pulmonary disease) (CMS/HHS-HCC)   . DDD (degenerative disc disease), cervical   . DDD (degenerative disc disease), lumbar   . Depression   . Dermatitis   . Diabetes mellitus, type 2 (CMS/HHS-HCC)   . Esophageal stricture   . External hemorrhoids   . GERD (gastroesophageal reflux disease)   . Hepatitis C   . History of alcohol abuse 12/15/2010  . History of cocaine abuse (CMS/HHS-HCC) 12/15/2010  . History of coronary artery bypass graft x 1 11/17/2016  . HTN (hypertension)   . Hypokalemia   . Hypothyroidism   . Joint pain   . Polysubstance abuse (CMS/HHS-HCC) 02/20/2017   PMH of polysubstance abuse    . Pulmonary emphysema (CMS/HHS-HCC)   . Restless leg syndrome   . Sternal pain 02/28/2018

## 2024-01-22 NOTE — ED Notes (Signed)
 MD Veverka brought to room to assess pt.

## 2024-01-22 NOTE — ED Provider Notes (Signed)
 Initial Provider Assessment  Interpreter used: no Historian: patient  HPI:  63 y.o. female who presents to the ED with chest pain that has been going on for months. Recently seen at [facility]. CTAP without obstruction of hernia, no PE.   Focused Physical Exam: Vitals:   01/22/24 2005 01/22/24 2131 01/22/24 2147  BP: (!) 191/108 (!) 169/74   BP Location: Right upper arm    Patient Position: Sitting    Pulse: 96 94 91  Resp: 18 20   Temp: 36.7 C (98 F) 36.1 C (97 F)   TempSrc: Tympanic Tympanic   SpO2: 91% 95%     Gen: No Acute Distress HEENT: Normocephalic and atraumatic CV: Regular rate Lung: No respiratory distress Abd: Soft, non-tender, non-distended Neuro: Alert and awake, moving all extremities well  Today's Concerns: # chest pain   Ecg - RBB sub mm depression inferior leads, no reciprocal changes, no indication for STEMI actaivation at this time.   HDS, neuro intact. Will order labs and imaging. No immediate life threatening illnesses evident, will obtain labs and imaging and send to waiting room while awaiting open bed.   Medical Decision Making and Plan: Given the patient's initial provider assessment, the following diagnostic evaluation and therapeutic interventions have been ordered. The patient will be placed in the appropriate treatment space, once one is available, to complete the evaluation and treatment.  I have discussed the plan of care with the patient. Prior notes reviewed: yes Tests ordered:  Orders Placed This Encounter  Procedures  . X-ray chest PA and lateral  . Complete Blood Count (CBC) with Differential  . Comprehensive Metabolic Panel (CMP)  . Activated Partial Thromboplastin Time (APTT)  . Prothrombin Time (INR)  . Troponin I, High Sensitivity ED Evaluation (hsTnI)  . Pro-Brain Natriuretic peptide, N-Terminal (NT-pro-BNP)  . Blood Gas, Venous  . Troponin I, High Sensitivity One Hour (hsTnI)  . Droplet isolation status  . Contact  isolation status  . ECG 12-lead  . ECG 12-lead  . ECG 12-lead  . Peripheral IV Access, Obtain & Maintain- Adult   Treatments ordered:  Medications  lidocaine  (XYLOCAINE ) 1 % injection 0.5 mL (has no administration in time range)  ketorolac  (TORADOL ) injection 15 mg (has no administration in time range)      Shyrl Merilee Ina, MD 01/22/24 2251

## 2024-01-22 NOTE — Progress Notes (Signed)
 I performed a history and physical examination of Meghan Jimenez as documented in the resident/fellow/APP note and discussed her management with:  Treatment Team:  Attending Provider: Cort Asberry Carson, MD Resident: Norma Dorien Sic, MD Registered Nurse: Delores Rouse, RN   I agree with the history, physical, assessment, and plan of care, with the following exceptions: None  63YO F with multiple medical comorbidities presents to the ED with concern for chest pain, radiates bilateral upper extremities, 3 days in duration. Headache, burning sensation throat, feels like she needs esophageal dilation procedure again, 3 weeks cough, reports occasional blood tinge in the sputum, fatigue. Upper abdominal discomfort in setting of a large ventral hernia.   Patient examined at bedside with resident MD.  Ventral hernia was soft and reducible.  Chronic appearing wound to her chest that has been nonhealing for several months.     Evaluate for acute coronary syndrome, pneumonia.  Ventral hernia is soft and reducible and less concern for incarcerated hernia or strangulation.  Feel pulmonary embolism is unlikely.  Patient with chronic dysphagia and it sounds like she likely needs repeat endoscopy and dilation procedure which she has had several times before.  Will assess and make sure she can tolerate p.o. intake here.   CXR- Impression: Right-sided pulmonary opacities with a right-sided pleural effusion concerning for infection, or aspiration.  Past Medical History:  Diagnosis Date  . Anxiety   . Chronic back pain   . Chronic neck pain   . Cirrhosis of liver (CMS/HHS-HCC) 11/14/2016  . COPD (chronic obstructive pulmonary disease) (CMS/HHS-HCC)   . DDD (degenerative disc disease), cervical   . DDD (degenerative disc disease), lumbar   . Depression   . Dermatitis   . Diabetes mellitus, type 2 (CMS/HHS-HCC)   . Esophageal stricture   . External hemorrhoids   . GERD (gastroesophageal  reflux disease)   . Hepatitis C   . History of alcohol abuse 12/15/2010  . History of cocaine abuse (CMS/HHS-HCC) 12/15/2010  . History of coronary artery bypass graft x 1 11/17/2016  . HTN (hypertension)   . Hypokalemia   . Hypothyroidism   . Joint pain   . Polysubstance abuse (CMS/HHS-HCC) 02/20/2017   PMH of polysubstance abuse    . Pulmonary emphysema (CMS/HHS-HCC)   . Restless leg syndrome   . Sternal pain 02/28/2018   10:30 PM Await labs.  I was present for the following procedures: None Time Spent in Critical Care of the patient: None Time spent in discussions with the patient and family: 10 minutes  Asberry Carson Donohoe

## 2024-01-22 NOTE — ED Triage Notes (Signed)
 Pt reporting to ED d/t CP x4 months and elevated BP. Pt has missing sternum from past heart surgery with wound that has not healed in 3 months. Pt reports abdominal hernia that is significant in size and tender to touch. Pt complaining of headaches, fatigue, radiating pain from chest to bilateral arms. CP rated 10/10. Pt aox4. GCS 15.  Past Medical History:  Diagnosis Date  . Anxiety   . Chronic back pain   . Chronic neck pain   . Cirrhosis of liver (CMS/HHS-HCC) 11/14/2016  . COPD (chronic obstructive pulmonary disease) (CMS/HHS-HCC)   . DDD (degenerative disc disease), cervical   . DDD (degenerative disc disease), lumbar   . Depression   . Dermatitis   . Diabetes mellitus, type 2 (CMS/HHS-HCC)   . Esophageal stricture   . External hemorrhoids   . GERD (gastroesophageal reflux disease)   . Hepatitis C   . History of alcohol abuse 12/15/2010  . History of cocaine abuse (CMS/HHS-HCC) 12/15/2010  . History of coronary artery bypass graft x 1 11/17/2016  . HTN (hypertension)   . Hypokalemia   . Hypothyroidism   . Joint pain   . Polysubstance abuse (CMS/HHS-HCC) 02/20/2017   PMH of polysubstance abuse    . Pulmonary emphysema (CMS/HHS-HCC)   . Restless leg syndrome   . Sternal pain 02/28/2018

## 2024-01-23 NOTE — ED Notes (Signed)
 Pt assisted to [facility]. Pt provided with ice. Pt denies any other needs at this time.

## 2024-01-23 NOTE — ED Notes (Signed)
 Providers wanted to try see if pt could tolerate swallowing. Pt provided with PO Amlodipine . Pt tolerated well. Pt denies any sensations of choking or pills stuck in throat at this time. Pt reports that this morning she choked on her pills and was having trouble swallowing. MD notified.

## 2024-01-23 NOTE — ED Provider Notes (Signed)
 10:22 PM Assumed care of patient from offgoing team. For more details, please see note from same day.  In brief, this is a 63 y.o. female with past medical history notable for CAD s/p CABG and sternotomy C/B wound nonunion and hiatal/epigastric hernia, COPD on no baseline O2, T2DM, SUD .  Initial vital signs: BP 124/83   Pulse 105   Temp 36 C (96.8 F) (Tympanic)   Resp 16   SpO2 93%   Outstanding tasks / studies / consultations include: - Follow-up rest of her blood work, discussed with general medicine about possible admission given difficulty swallowing  Plan/Dispo at time of sign-out: Pending  ED Course since sign-out: ED Course as of 01/23/24 2222  Tue Jan 22, 2024  2302 SO: Possible gen med admit 59 yof hx of prior CABG w/ sternotomy, chronic chest pain radiates b/l upper extremity, large epigastric hernia, no acute complaints but chest pain. Hypertensive's not taking medications. Failed swallow study. [TW]  2304 X-ray chest PA and lateral Impression: Right-sided pulmonary opacities with a right-sided pleural effusion concerning for infection, or aspiration.  [TW]    ED Course User Index [TW] [name] Lani Sharper, MD    -I seen care of this patient at 2300 pending the rest of her laboratory workup.  On my review of her laboratory result overall reassuring.  Her ED troponins ruled out, negative COVID and flu testing.  Her venous blood gas also reassuring.  Chest x-ray interpreted by radiologist per ED course below concern for possible infection or aspiration.  On my exam she did have some wheezing diffusely and she endorsed mild subjective fevers and chills yesterday and absence of any dysuria or hematuria.  The decision was made given her history of COPD, and possible aspiration events to treat her for a COPD exacerbation, mild in nature with 1 g of ceftriaxone , DuoNeb, and steroids.  On my reassessment of patient patient was able to pass a swallow study and take her home meds  which was a change from previously.  I did discuss admission with her general medicine team given her dysphagia however it appears that this is likely chronic on review of medical records and she is now able to tolerate p.o.  Patient is comfortable going home.  I did discuss with her the need to follow-up with GI as well as her primary care doctor for possible upper endoscopy as well as close follow-up.  She was also given prescriptions for her albuterol  inhaler, 5-day course of prednisone , and 3-day course of doxycycline  given her QTc was prolonged on EKG here at 491.  I discussed tricked return precautions with patient including to return if she aspirates, develops fevers, chills, or new or worsening chest pain.  On my exam her cranial nerves were intact and she had no new neurodeficits reassuring against any sort of central etiology of her dysphagia.  Patient was discharged in stable condition after ED case manager was contacted to facilitate ride home. Dispo: Home     [name] Lani Sharper, MD Resident 01/23/24 [phone]

## 2024-01-23 NOTE — ED Notes (Signed)
 Discharge instructions reviewed with patient. Pt verbalized understanding, questions and concerns addressed. Respirations even and unlabored. Equal and bilateral rise and fall of chest bilaterally. Pt escorted out of department and confirmed having all personal belongings. Vital signs stable at this time. Pt to go home with [facility]. Pt taken outside per request to smoke a cigarette until ride gets here.

## 2024-01-23 NOTE — Consults (Signed)
 ED CM NOTE:   Patient requires assistance with transportation home. CM reviewed chart and met with pt. Pt has no collateral contacts that can provide transportation home, and is unable to pay for transportation home. Pt verified address on the face sheet and states ability to enter the home. CM contacted [employer] [phone] who gave the estimates cost as $150. CM completed sponsorship form and faxed to [employer]. CM notified RN of pt's anticipated transport home.   [id number] Meghan Jimenez [place] [id number]    01/23/24 0612  Transportation Services  Arrangements hospital sponsored  Discharge Transportation taxi / bus / car service  Date 01/23/24  Time 0630  Phone/Fax [phone]  Vendor [employer]  Anticipated Costs 150   Meghan Jimenez, [place], MSW, [place], [place] [facility] Case Management Office Phone:  [phone] Pager: [phone]

## 2024-01-23 NOTE — Progress Notes (Signed)
 [facility] Emergency Department Nursing Handoff    I - Illness Severity:   Patient is being admitted as Emergency to Emergency Medicine  [name], T. The encounter diagnosis was Chest pain, unspecified type. First Call Provider is .  Code Status: Prior Isolation Status:No active isolations Telemetry: No  -   Safety Concern: Falls Risk  Family/Friend at Bedside? no  Weight:   Weight Method:    Mobility: ambulates with assistance  Communication Barriers: None Identified  Transition orders: No   P- Patient Summary:  Chief Complaint  Patient presents with  . Chest Pain   Past Medical History:  Diagnosis Date  . Anxiety   . Chronic back pain   . Chronic neck pain   . Cirrhosis of liver (CMS/HHS-HCC) 11/14/2016  . COPD (chronic obstructive pulmonary disease) (CMS/HHS-HCC)   . DDD (degenerative disc disease), cervical   . DDD (degenerative disc disease), lumbar   . Depression   . Dermatitis   . Diabetes mellitus, type 2 (CMS/HHS-HCC)   . Esophageal stricture   . External hemorrhoids   . GERD (gastroesophageal reflux disease)   . Hepatitis C   . History of alcohol abuse 12/15/2010  . History of cocaine abuse (CMS/HHS-HCC) 12/15/2010  . History of coronary artery bypass graft x 1 11/17/2016  . HTN (hypertension)   . Hypokalemia   . Hypothyroidism   . Joint pain   . Polysubstance abuse (CMS/HHS-HCC) 02/20/2017   PMH of polysubstance abuse    . Pulmonary emphysema (CMS/HHS-HCC)   . Restless leg syndrome   . Sternal pain 02/28/2018   Past Surgical History:  Procedure Laterality Date  . CORONARY ARTERY BYPASS W/SINGLE ARTERY GRAFT N/A 11/17/2016   Procedure: CORONARY ARTERY BYPASS, USING ARTERIAL GRAFT(S); SINGLE ARTERIAL GRAFT, Left internal mammary artery harvest;  Surgeon: Norleen Dow Exon, MD;  Location: [facility];  Service: Cardiothoracic;  Laterality: N/A;  . HARVEST LEFT INTERNAL MAMMARY ARTERY FOR CABG Left 11/17/2016   Procedure: HARVEST LEFT INTERNAL  MAMMARY ARTERY FOR CABG;  Surgeon: Norleen Dow Exon, MD;  Location: [facility];  Service: Cardiothoracic;  Laterality: Left;  . INCISION & DRAINAGE SEROMA CHEST N/A 12/06/2016   Procedure: INCISION AND DRAINAGE OF HEMATOMA, SEROMA OR FLUID COLLECTION; CHEST, s/p cabg 11/17/16;  Surgeon: Exon Norleen Dow, MD;  Location: [facility];  Service: Cardiothoracic;  Laterality: N/A;  . APPLICATION WOUND VAC N/A 12/06/2016   Procedure: NEGATIVE PRESSURE WOUND THERAPY, INCLUDING TOPICAL APPLICATION(S), ASSESSMENT, INSTRUCTION(S) FOR ONGOING CARE, PER SESSION; TOTAL WOUND(S) SURFACE AREA LESS THAN OR EQUAL TO 50 SQUARE CENTIMETERS;  Surgeon: Exon Norleen Dow, MD;  Location: [facility];  Service: Cardiothoracic;  Laterality: N/A;  . CHEST TUBE PLACEMENT Left 12/06/2016   Procedure: TUBE THORACOSTOMY, INCLUDES CONNECTION TO DRAINAGE SYSTEM (EG, WATER SEAL), WHEN PERFORMED, OPEN (SEPARATE PROCEDURE);  Surgeon: Exon Norleen Dow, MD;  Location: [facility];  Service: Cardiothoracic;  Laterality: Left;  . FLAP MYOCUTANEOUS/FASCIOCUTANEOUS CHEST N/A 12/08/2016   Procedure: MUSCLE, MYOCUTANEOUS, OR FASCIOCUTANEOUS FLAP; PECTORALIS, s/p cabg 11/17/16, s/p I and D sternal wound 12/06/16;  Surgeon: Norleen Dow Exon, MD;  Location: [facility];  Service: Cardiothoracic;  Laterality: N/A;  . EXPLORATION CHEST FOR POSTOPERATIVE THROMBOSIS/INFECTION THORACOTOMY N/A 01/05/2017   Procedure: EXPLORATION CHEST FOR POSTOPERATIVE HEMORRHAGE, THROMBOSIS OR INFECTION THORACOTOMY;  Surgeon: Exon Norleen Dow, MD;  Location: [facility];  Service: Cardiothoracic;  Laterality: N/A;  . APPLICATION WOUND VAC N/A 01/05/2017   Procedure: NEGATIVE PRESSURE WOUND THERAPY, INCLUDING TOPICAL APPLICATION(S), ASSESSMENT, INSTRUCTION(S) FOR  ONGOING CARE, PER SESSION; TOTAL WOUND(S) SURFACE AREA LESS THAN OR EQUAL TO 50 SQUARE CENTIMETERS;  Surgeon: Jamey Norleen Don, MD;  Location: [facility];  Service: Cardiothoracic;  Laterality: N/A;  . OMENTAL FLAP N/A 01/08/2017   Procedure: OMENTAL FLAP, EXTRA - ABDOMINAL (EG, FOR RECONSTRUCTION OF STERNAL AND CHEST WALL DEFECTS);  Surgeon: Jamey Norleen Don, MD;  Location: [facility];  Service: Cardiothoracic;  Laterality: N/A;  . INCISION & DRAINAGE POSTOP WOUND INFECTION CHEST N/A 03/03/2017   Procedure: INCISION AND DRAINAGE, COMPLEX, POSTOPERATIVE WOUND INFECTION; CHEST;  Surgeon: Jamey Norleen Don, MD;  Location: [facility];  Service: Cardiothoracic;  Laterality: N/A;  . EXTRACTION TEETH N/A 05/18/2017   Procedure: EXTRACTION TEETH:#22, #23, #24, #25, #26, #27 remaining mandibualr dentition;  Surgeon: Celestia Addie Blizzard, DDS;  Location: [facility];  Service: Plastic Surgery;  Laterality: N/A;  . CERVICAL SPINE SURGERY    . CHOLECYSTECTOMY    . Corrective nose surgery secondary to fracture    . ESOPHAGEAL DILATION    . LUMBAR SPINE SURGERY    . Peritoneal abdominal laparoscopy    . TUBAL LIGATION      Assessment Assessment significant for: Respiratory and Cardiac (see flowsheet for additional assessment details) Assessments (last filed documentation for the encounter) Adult (>=13yo)   Neuro: Glasgow Coma Scale Eye Opening: Spontaneous (01/22/24 2132) Best Verbal Response: Oriented (01/22/24 2132) Best Motor Response: Obeys commands (01/22/24 2132) Glasgow Coma Scale Score: 15 (01/22/24 2132) Richmond Agitation Sedation Scale Richmond Agitation Sedation Scale %%: 0 Alert and calm (01/23/24 0202) Neurological Level of Consciousness: Alert (01/22/24 2132) Orientation Level: Oriented X4 (01/22/24 2132) Cognition:  LAREY) (01/22/24 2132) Comprehension: Follows simple commands;Follows tiered commands (01/22/24 2132) Communication: Verbal (01/22/24 2132) Speech: Clear (01/22/24 2132) Dysphagia Screen: Fail - unable to drink 90mL without stopping (pt also coughed with 90 ml water) (01/22/24 2259) Motor  Function/Sensation Assessment:  (MAEI) (01/22/24 2132) Neuro Additional Assessments: No (01/22/24 2132) HEENT:   Respiratory: Respiratory Respiratory (WDL): Exceptions to WDL (01/22/24 2134) L Breath Sounds: Wheezes, Expiratory (01/22/24 2134) R Breath Sounds: Wheezes, Expiratory (01/22/24 2134) Respiratory Additional Assessments: No (01/22/24 2134) Respiratory Pattern: Dyspnea (even and unlabored) (01/22/24 2134)      Dyspnea grade: Considerable dyspnea (01/22/24 2134) Chest Assessment:  (Ce symm) (01/22/24 2134) Cough/Sputum Description Cough: Productive, sputum not observed (01/22/24 2134) Cardiac: Cardiovascular Cardiovascular  (WDL): Exceptions to WDL (01/22/24 2135) Cardiac Regularity: Regular (01/22/24 2147) Heart Sounds: S1, S2 (01/22/24 2135) Jugular Venous Distention (JVD): No (01/22/24 2135) Cardiac Rhythm: Normal sinus rhythm (01/22/24 2147) Cardiac Symptoms: Shortness of Breath;Pain (01/22/24 2135) Capillary Refill: Brisk - Less than or equal to 2 seconds - bilateral UEs (01/22/24 2135)  GI:   GU:   Musculoskeletal: Neurovascular Assessment Motor Function/Sensation Assessment:  (MAEI) (01/22/24 2132) Stroke:     Skin: Obvious skin breakdown noted in ED: Yes  Pt voided in the ED: Yes   Patient is oriented to person, oriented to place, oriented to time, and oriented to situation.  Last Vital Signs: T 36.1 C (97 F) (01/22/24 2131)  BP (!) 169/74 (01/22/24 2131)  HR 91 (01/22/24 2147)  RR 20 (01/22/24 2131)  O2sat 95 %        Pain   9 (01/23/24 0202)   LDA: Peripheral IV 01/22/24 Right Basilic;Forearm (1)  Pain: Last medicated at 202.   Medications/IV Fluids currently infusing:   A - Action List:   Important Nursing handoff items:abnormal labs, abnormal assessments, and abnormal imaging  S-  Synthesis by Receiver:  For questions or clarification from the ED Nurse please call [phone] and ask for THERSIA DARING  ED Arrival: 01/22/2024 1922

## 2024-03-18 ENCOUNTER — Emergency Department

## 2024-03-18 ENCOUNTER — Other Ambulatory Visit: Payer: Self-pay

## 2024-03-18 DIAGNOSIS — K439 Ventral hernia without obstruction or gangrene: Secondary | ICD-10-CM | POA: Insufficient documentation

## 2024-03-18 DIAGNOSIS — J441 Chronic obstructive pulmonary disease with (acute) exacerbation: Secondary | ICD-10-CM | POA: Diagnosis not present

## 2024-03-18 DIAGNOSIS — I509 Heart failure, unspecified: Secondary | ICD-10-CM | POA: Insufficient documentation

## 2024-03-18 DIAGNOSIS — I110 Hypertensive heart disease with heart failure: Secondary | ICD-10-CM | POA: Diagnosis not present

## 2024-03-18 DIAGNOSIS — M7918 Myalgia, other site: Secondary | ICD-10-CM | POA: Insufficient documentation

## 2024-03-18 DIAGNOSIS — R059 Cough, unspecified: Secondary | ICD-10-CM | POA: Diagnosis present

## 2024-03-18 DIAGNOSIS — E119 Type 2 diabetes mellitus without complications: Secondary | ICD-10-CM | POA: Diagnosis not present

## 2024-03-18 LAB — CBC
HCT: 34.7 % — ABNORMAL LOW (ref 36.0–46.0)
Hemoglobin: 10.6 g/dL — ABNORMAL LOW (ref 12.0–15.0)
MCH: 20.4 pg — ABNORMAL LOW (ref 26.0–34.0)
MCHC: 30.5 g/dL (ref 30.0–36.0)
MCV: 66.7 fL — ABNORMAL LOW (ref 80.0–100.0)
Platelets: 134 K/uL — ABNORMAL LOW (ref 150–400)
RBC: 5.2 MIL/uL — ABNORMAL HIGH (ref 3.87–5.11)
RDW: 18.7 % — ABNORMAL HIGH (ref 11.5–15.5)
WBC: 7.4 K/uL (ref 4.0–10.5)
nRBC: 0 % (ref 0.0–0.2)

## 2024-03-18 NOTE — ED Triage Notes (Addendum)
 Pt reports cough congestion for the past 3-4 months, hx copd. Pt reports generalized body aches also, pt reports pain is the worst in her chest and back. Pt had sternotomy at [facility] aprox 5 years ago. Pt also noted to have hiatal/epigastric hernia visible.

## 2024-03-19 ENCOUNTER — Emergency Department

## 2024-03-19 ENCOUNTER — Emergency Department: Admission: EM | Admit: 2024-03-19 | Discharge: 2024-03-19 | Disposition: A

## 2024-03-19 DIAGNOSIS — M791 Myalgia, unspecified site: Secondary | ICD-10-CM

## 2024-03-19 DIAGNOSIS — K439 Ventral hernia without obstruction or gangrene: Secondary | ICD-10-CM

## 2024-03-19 DIAGNOSIS — J441 Chronic obstructive pulmonary disease with (acute) exacerbation: Secondary | ICD-10-CM

## 2024-03-19 DIAGNOSIS — R112 Nausea with vomiting, unspecified: Secondary | ICD-10-CM

## 2024-03-19 LAB — URINALYSIS, COMPLETE (UACMP) WITH MICROSCOPIC
Bacteria, UA: NONE SEEN
Bilirubin Urine: NEGATIVE
Glucose, UA: NEGATIVE mg/dL
Hgb urine dipstick: NEGATIVE
Ketones, ur: NEGATIVE mg/dL
Leukocytes,Ua: NEGATIVE
Nitrite: NEGATIVE
Protein, ur: 100 mg/dL — AB
RBC / HPF: 0 RBC/hpf (ref 0–5)
Specific Gravity, Urine: 1.009 (ref 1.005–1.030)
pH: 6 (ref 5.0–8.0)

## 2024-03-19 LAB — HEPATIC FUNCTION PANEL
ALT: 43 U/L (ref 0–44)
AST: 66 U/L — ABNORMAL HIGH (ref 15–41)
Albumin: 3.7 g/dL (ref 3.5–5.0)
Alkaline Phosphatase: 150 U/L — ABNORMAL HIGH (ref 38–126)
Bilirubin, Direct: 0.3 mg/dL — ABNORMAL HIGH (ref 0.0–0.2)
Indirect Bilirubin: 0.2 mg/dL — ABNORMAL LOW (ref 0.3–0.9)
Total Bilirubin: 0.5 mg/dL (ref 0.0–1.2)
Total Protein: 7 g/dL (ref 6.5–8.1)

## 2024-03-19 LAB — BASIC METABOLIC PANEL WITH GFR
Anion gap: 12 (ref 5–15)
BUN: 8 mg/dL (ref 8–23)
CO2: 25 mmol/L (ref 22–32)
Calcium: 10.3 mg/dL (ref 8.9–10.3)
Chloride: 97 mmol/L — ABNORMAL LOW (ref 98–111)
Creatinine, Ser: 0.82 mg/dL (ref 0.44–1.00)
GFR, Estimated: >60 mL/min (ref >60)
Glucose, Bld: 108 mg/dL — ABNORMAL HIGH (ref 70–99)
Potassium: 3.6 mmol/L (ref 3.5–5.1)
Sodium: 134 mmol/L — ABNORMAL LOW (ref 135–145)

## 2024-03-19 LAB — BLOOD GAS, VENOUS
Acid-Base Excess: 2.5 mmol/L — ABNORMAL HIGH (ref 0.0–2.0)
Bicarbonate: 27.9 mmol/L (ref 20.0–28.0)
O2 Saturation: 84 %
Patient temperature: 37
pCO2, Ven: 45 mmHg (ref 44–60)
pH, Ven: 7.4 (ref 7.25–7.43)
pO2, Ven: 53 mmHg — ABNORMAL HIGH (ref 32–45)

## 2024-03-19 LAB — TROPONIN T, HIGH SENSITIVITY
Troponin T High Sensitivity: 15 ng/L (ref 0–19)
Troponin T High Sensitivity: <15 ng/L (ref 0–19)

## 2024-03-19 LAB — PRO BRAIN NATRIURETIC PEPTIDE: Pro Brain Natriuretic Peptide: 73.3 pg/mL (ref <300.0)

## 2024-03-19 LAB — RESP PANEL BY RT-PCR (RSV, FLU A&B, COVID)  RVPGX2
Influenza A by PCR: NEGATIVE
Influenza B by PCR: NEGATIVE
Resp Syncytial Virus by PCR: NEGATIVE
SARS Coronavirus 2 by RT PCR: NEGATIVE

## 2024-03-19 LAB — LIPASE, BLOOD: Lipase: 48 U/L (ref 11–51)

## 2024-03-19 MED ORDER — IPRATROPIUM-ALBUTEROL 0.5-2.5 (3) MG/3ML IN SOLN
3.0000 mL | Freq: Once | RESPIRATORY_TRACT | Status: AC
  Administered 2024-03-19: 3 mL via RESPIRATORY_TRACT
  Filled 2024-03-19: qty 3

## 2024-03-19 MED ORDER — AZITHROMYCIN 250 MG PO TABS: ORAL_TABLET | ORAL | 0 refills | Status: AC

## 2024-03-19 MED ORDER — METHYLPREDNISOLONE SODIUM SUCC 125 MG IJ SOLR
125.0000 mg | Freq: Once | INTRAMUSCULAR | Status: AC
  Administered 2024-03-19: 125 mg via INTRAVENOUS
  Filled 2024-03-19: qty 2

## 2024-03-19 MED ORDER — ONDANSETRON HCL 4 MG/2ML IJ SOLN
4.0000 mg | Freq: Once | INTRAMUSCULAR | Status: AC
  Administered 2024-03-19: 4 mg via INTRAVENOUS
  Filled 2024-03-19: qty 2

## 2024-03-19 MED ORDER — ALBUTEROL SULFATE HFA 108 (90 BASE) MCG/ACT IN AERS: 2.0000 | INHALATION_SPRAY | Freq: Four times a day (QID) | RESPIRATORY_TRACT | 2 refills | Status: AC | PRN

## 2024-03-19 MED ORDER — LIDOCAINE 5 % EX PTCH: 1.0000 | MEDICATED_PATCH | CUTANEOUS | 0 refills | Status: AC

## 2024-03-19 MED ORDER — ONDANSETRON 4 MG PO TBDP: 4.0000 mg | ORAL_TABLET | Freq: Three times a day (TID) | ORAL | 0 refills | Status: AC | PRN

## 2024-03-19 MED ORDER — MORPHINE SULFATE (PF) 2 MG/ML IV SOLN
2.0000 mg | Freq: Once | INTRAVENOUS | Status: AC
  Administered 2024-03-19: 2 mg via INTRAVENOUS
  Filled 2024-03-19: qty 1

## 2024-03-19 MED ORDER — PANTOPRAZOLE SODIUM 40 MG IV SOLR
40.0000 mg | Freq: Once | INTRAVENOUS | Status: AC
  Administered 2024-03-19: 40 mg via INTRAVENOUS
  Filled 2024-03-19: qty 10

## 2024-03-19 MED ORDER — PREDNISONE 20 MG PO TABS: 40.0000 mg | ORAL_TABLET | Freq: Every day | ORAL | 0 refills | Status: AC

## 2024-03-19 MED ORDER — IOHEXOL 300 MG/ML  SOLN
100.0000 mL | Freq: Once | INTRAMUSCULAR | Status: AC | PRN
  Administered 2024-03-19: 100 mL via INTRAVENOUS

## 2024-03-19 NOTE — ED Notes (Signed)
 CCMD called to admit patient to monitor

## 2024-03-19 NOTE — ED Notes (Signed)
 Pt called out to use the BR. Pt ambulated to the toilet. Pt provided urine sample. Pt back to bed.

## 2024-03-19 NOTE — ED Notes (Signed)
 Pt called out requesting something for pain and nausea. Clarine, MD notified via secure chat, awaiting response.

## 2024-03-19 NOTE — Discharge Instructions (Addendum)
 Your evaluation in the emergency department was overall reassuring.  I did have a surgeon review your abdominal CT imaging, and they recommend you follow-up with your surgery team at [facility] to discuss outpatient repair of your hernia.  I prescribed you nausea medication as well as Lidoderm  patches to use as needed for any ongoing discomfort.  We also some evidence of a COPD exacerbation, and I have started you on a short course of steroids and antibiotics to treat this.  I also refilled your inhaler.  Please follow-up with your primary care provider in addition.  Return to the emergency department with any new or worsening symptoms.

## 2024-03-19 NOTE — ED Provider Notes (Signed)
 [facility] Provider Note    Event Date/Time   First MD Initiated Contact with Patient 03/19/24 0211     (approximate)   History   Cough  Pt reports cough congestion for the past 3-4 months, hx copd. Pt reports generalized body aches also, pt reports pain is the worst in her chest and back. Pt had sternotomy at [facility] aprox 5 years ago. Pt also noted to have hiatal/epigastric hernia visible.    HPI Meghan Jimenez is a 63 y.o. female PMH multiple medical comorbidities including COPD/emphysema, cirrhosis, diastolic CHF, hyperlipidemia, hyponatremia, prior GI bleed, diabetes, anxiety, hepatitis C, CAD, hypertension presents for evaluation of multiple complaints including cough, congestion, myalgias - Appears patient is primarily here because she is feeling more short of breath than usual, has been having cough productive of yellow sputum.  No clear fevers.  Also is complaining of abdominal pain and a few episodes of vomiting today. - Also notes increased urinary frequency - Has multiple other complaints including myalgias, apparent chronic low back pain, fatigue - Tells me she is on 2 L nasal cannula at baseline at home  Per chart review, last admitted in March of this year for evaluation of chest pain, found to have pneumonia.  Last seen at [facility] emergency department on 01/22/2024 complaining of chest pain.  Workup at that time most consistent with COPD exacerbation.      Physical Exam   Triage Vital Signs: ED Triage Vitals  Encounter Vitals Group     BP 03/18/24 2325 (!) 176/100     Girls Systolic BP Percentile --      Girls Diastolic BP Percentile --      Boys Systolic BP Percentile --      Boys Diastolic BP Percentile --      Pulse Rate 03/18/24 2325 98     Resp 03/18/24 2325 (!) 22     Temp 03/18/24 2325 98 F (36.7 C)     Temp src --      SpO2 03/18/24 2325 94 %     Weight 03/18/24 2321 200 lb (90.7 kg)     Height 03/18/24 2321 5' 4  (1.626 m)     Head Circumference --      Peak Flow --      Pain Score 03/18/24 2321 10     Pain Loc --      Pain Education --      Exclude from Growth Chart --     Most recent vital signs: Vitals:   03/19/24 0307 03/19/24 0415  BP:  (!) 158/64  Pulse:  99  Resp:  15  Temp: 98.1 F (36.7 C) 98.7 F (37.1 C)  SpO2:  97%     General: Awake, no distress.  CV:  Good peripheral perfusion. RRR, RP 2+ Resp:  My tachypnea, diffuse end expiratory wheezing throughout, mildly coarse breath sounds throughout Abd:  Large reducible ventral hernia in upper abdomen with no overlying skin change but no significant pain with palpation, mild tenderness to palpation throughout, not grossly distended.  Mild tenderness to palpation throughout low back. Other:  No significant lower extremity edema   ED Results / Procedures / Treatments   Labs (all labs ordered are listed, but only abnormal results are displayed) Labs Reviewed  BASIC METABOLIC PANEL WITH GFR - Abnormal; Notable for the following components:      Result Value   Sodium 134 (*)    Chloride 97 (*)  Glucose, Bld 108 (*)    All other components within normal limits  CBC - Abnormal; Notable for the following components:   RBC 5.20 (*)    Hemoglobin 10.6 (*)    HCT 34.7 (*)    MCV 66.7 (*)    MCH 20.4 (*)    RDW 18.7 (*)    Platelets 134 (*)    All other components within normal limits  HEPATIC FUNCTION PANEL - Abnormal; Notable for the following components:   AST 66 (*)    Alkaline Phosphatase 150 (*)    Bilirubin, Direct 0.3 (*)    Indirect Bilirubin 0.2 (*)    All other components within normal limits  BLOOD GAS, VENOUS - Abnormal; Notable for the following components:   pO2, Ven 53 (*)    Acid-Base Excess 2.5 (*)    All other components within normal limits  URINALYSIS, COMPLETE (UACMP) WITH MICROSCOPIC - Abnormal; Notable for the following components:   Color, Urine STRAW (*)    APPearance CLEAR (*)    Protein, ur  100 (*)    All other components within normal limits  RESP PANEL BY RT-PCR (RSV, FLU A&B, COVID)  RVPGX2  PRO BRAIN NATRIURETIC PEPTIDE  LIPASE, BLOOD  TROPONIN T, HIGH SENSITIVITY  TROPONIN T, HIGH SENSITIVITY     EKG  See ED course below   RADIOLOGY Radiology interpreted by myself and radiology report reviewed.  No acute pathology identified.  Ventral hernia appears stable from prior, no evidence of obstruction.    PROCEDURES:  Critical Care performed: No  Procedures   MEDICATIONS ORDERED IN ED: Medications  ondansetron  (ZOFRAN ) injection 4 mg (has no administration in time range)  ipratropium-albuterol  (DUONEB) 0.5-2.5 (3) MG/3ML nebulizer solution 3 mL (3 mLs Nebulization Given 03/19/24 0312)  ipratropium-albuterol  (DUONEB) 0.5-2.5 (3) MG/3ML nebulizer solution 3 mL (3 mLs Nebulization Given 03/19/24 9687)  methylPREDNISolone  sodium succinate (SOLU-MEDROL ) 125 mg/2 mL injection 125 mg (125 mg Intravenous Given 03/19/24 0314)  ondansetron  (ZOFRAN ) injection 4 mg (4 mg Intravenous Given 03/19/24 0313)  morphine  (PF) 2 MG/ML injection 2 mg (2 mg Intravenous Given 03/19/24 0318)  pantoprazole  (PROTONIX ) injection 40 mg (40 mg Intravenous Given 03/19/24 0319)  iohexol  (OMNIPAQUE ) 300 MG/ML solution 100 mL (100 mLs Intravenous Contrast Given 03/19/24 0350)     IMPRESSION / MDM / ASSESSMENT AND PLAN / ED COURSE  I reviewed the triage vital signs and the nursing notes.                              DDX/MDM/AP: Differential diagnosis includes, but is not limited to, COPD exacerbation, consider underlying viral syndrome, pneumonia, doubt ACS.  Consider possibility of bowel obstruction though do not suspect incarcerated hernia on my initial eval.  That underlying UTI.  Plan: - Labs - DuoNeb, methylprednisolone  -Chest x-ray - EKG - Pain control, Protonix  - Anticipate need for abdominal imaging in addition   Patient's presentation is most consistent with acute  presentation with potential threat to life or bodily function.  The patient is on the cardiac monitor to evaluate for evidence of arrhythmia and/or significant heart rate changes.  ED course below.  Initial laboratory workup overall unremarkable.  Chest x-ray with no appreciable pulmonary pathology.  Escalated to CT chest abdomen pelvis which showed no clear evidence of acute pathology.  General surgery reviewed imaging as well, agree no concern for obstruction or other acute intra-abdominal pathology they do recommend patient get outpatient  evaluation at her primary surgery team at [facility] for consideration of repairing her hernia.  Patient feeling very well here after treatment for COPD exacerbation and a couple rounds of nausea medication.  No underlying UTI or viral syndrome identified.  Not complaining of any chest pain.  No evidence of acute pathology at this time.  Rx Zofran .  Plan for PMD follow-up.  ED return precautions placed.  Patient agrees with plan.  Clinical Course as of 03/19/24 0742  Wed Mar 19, 2024  0219 CBC with no leukocytosis, stable anemia, stable mild thrombocytopenia [MM]  0219 BMP reviewed, unremarkable  Troponin normal  Viral swab negative [MM]  0219 CXR: IMPRESSION: Large ventral hernia, containing air-filled transverse colon, along the midline of the lower chest wall.   [MM]  0220 Ecg = sinus rhythm, rate 95, no gross ST elevation or depression, no significant repolarization abnormality beyond right bundle branch block, normal axis, normal intervals.  No clear evidence of ischemia nor arrhythmia on my interpretation. [MM]  0351 VBG reassuring [MM]  0352 Rpt trop stable [MM]  0431 Bnp wnl [MM]  0445 Urinalysis with no evidence of infection [MM]  0503 CT CAP: IMPRESSION: 1. 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 Recommend surgical consultation. 2.  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 3. Cirrhosis with patent portal veins and splenorenal shunt; no enhancing intrahepatic mass. 4. Mild bronchial wall thickening with scattered airway impaction and bibasilar atelectasis/scar, compatible with airway inflammation. 5. Mild left nonobstructing nephrolithiasis. No hydronephrosis. No urolithiasis. 6. Status post partial gastrectomy and cholecystectomy.   [MM]  [phone] Patient reevaluated, no she is feeling much better  No further vomiting.  No abdominal pain.  Remains on her 2 L nasal cannula.  Lung exam notably improved after serial nebulizers and steroids.  Normal work of breathing.  Remains with reducible hernia.  Will discuss with general surgery.  Have attempted to contact radiologist initially read report as it appears to be stable from prior in September of this year, unfortunately they are no longer on shift and there is not another radiologist available until 7:30 AM. [MM]  [phone] D/w Dr. Cesar of general surgery Imaging reviewed.  States does not see any complication with hernia or other intra-abdominal pathology.  Does recommend that she gets this repaired in the outpatient setting, would recommend she go back to [facility] where she had her initial sternotomy as the surgery would require both general surgery and thoracic surgery which we would likely not be able to perform at our facility. [MM]    Clinical Course User Index [MM] Clarine Ozell LABOR, MD     FINAL CLINICAL IMPRESSION(S) / ED DIAGNOSES   Final diagnoses:  COPD exacerbation (HCC)  Nausea and vomiting, unspecified vomiting type  Ventral hernia without obstruction or gangrene  Myalgia     Rx / DC Orders   ED Discharge Orders          Ordered    predniSONE  (DELTASONE ) 20 MG tablet  Daily with breakfast        03/19/24 0738    albuterol  (VENTOLIN  HFA) 108 (90 Base) MCG/ACT inhaler  Every 6 hours PRN        03/19/24 0738    ondansetron   (ZOFRAN -ODT) 4 MG disintegrating tablet  Every 8 hours PRN        03/19/24 0738    lidocaine  (LIDODERM ) 5 %  Every 24 hours  03/19/24 0738    azithromycin  (ZITHROMAX  Z-PAK) 250 MG tablet        03/19/24 9261             Note:  This document was prepared using Dragon voice recognition software and may include unintentional dictation errors.   Clarine Ozell LABOR, MD 03/19/24 [phone]

## 2024-04-14 ENCOUNTER — Emergency Department

## 2024-04-14 ENCOUNTER — Ambulatory Visit: Payer: Self-pay

## 2024-04-14 ENCOUNTER — Emergency Department: Admission: EM | Admit: 2024-04-14 | Discharge: 2024-04-14 | Disposition: A | Discharge status: Home / Self Care

## 2024-04-14 DIAGNOSIS — I2510 Atherosclerotic heart disease of native coronary artery without angina pectoris: Secondary | ICD-10-CM | POA: Diagnosis not present

## 2024-04-14 DIAGNOSIS — M542 Cervicalgia: Secondary | ICD-10-CM

## 2024-04-14 DIAGNOSIS — M48061 Spinal stenosis, lumbar region without neurogenic claudication: Secondary | ICD-10-CM | POA: Diagnosis not present

## 2024-04-14 DIAGNOSIS — E119 Type 2 diabetes mellitus without complications: Secondary | ICD-10-CM | POA: Diagnosis not present

## 2024-04-14 DIAGNOSIS — M545 Low back pain, unspecified: Secondary | ICD-10-CM | POA: Diagnosis present

## 2024-04-14 DIAGNOSIS — J449 Chronic obstructive pulmonary disease, unspecified: Secondary | ICD-10-CM | POA: Diagnosis not present

## 2024-04-14 DIAGNOSIS — I509 Heart failure, unspecified: Secondary | ICD-10-CM | POA: Diagnosis not present

## 2024-04-14 LAB — BASIC METABOLIC PANEL WITH GFR
Anion gap: 11 (ref 5–15)
BUN: 13 mg/dL (ref 8–23)
CO2: 25 mmol/L (ref 22–32)
Calcium: 9.7 mg/dL (ref 8.9–10.3)
Chloride: 95 mmol/L — ABNORMAL LOW (ref 98–111)
Creatinine, Ser: 0.6 mg/dL (ref 0.44–1.00)
GFR, Estimated: >60 mL/min (ref >60)
Glucose, Bld: 105 mg/dL — ABNORMAL HIGH (ref 70–99)
Potassium: 4.7 mmol/L (ref 3.5–5.1)
Sodium: 130 mmol/L — ABNORMAL LOW (ref 135–145)

## 2024-04-14 LAB — TROPONIN T, HIGH SENSITIVITY: Troponin T High Sensitivity: <15 ng/L (ref 0–19)

## 2024-04-14 LAB — CBC
HCT: 34.1 % — ABNORMAL LOW (ref 36.0–46.0)
Hemoglobin: 10.4 g/dL — ABNORMAL LOW (ref 12.0–15.0)
MCH: 20.4 pg — ABNORMAL LOW (ref 26.0–34.0)
MCHC: 30.5 g/dL (ref 30.0–36.0)
MCV: 66.7 fL — ABNORMAL LOW (ref 80.0–100.0)
Platelets: 212 K/uL (ref 150–400)
RBC: 5.11 MIL/uL (ref 3.87–5.11)
RDW: 18.2 % — ABNORMAL HIGH (ref 11.5–15.5)
WBC: 7.8 K/uL (ref 4.0–10.5)
nRBC: 0 % (ref 0.0–0.2)

## 2024-04-14 MED ORDER — ALBUTEROL SULFATE HFA 108 (90 BASE) MCG/ACT IN AERS
2.0000 | INHALATION_SPRAY | Freq: Once | RESPIRATORY_TRACT | Status: AC
  Administered 2024-04-14: 23:00:00 2 via RESPIRATORY_TRACT
  Filled 2024-04-14: qty 6.7

## 2024-04-14 MED ORDER — OXYCODONE HCL 5 MG PO TABS
5.0000 mg | ORAL_TABLET | Freq: Once | ORAL | Status: AC
  Administered 2024-04-14: 21:00:00 5 mg via ORAL
  Filled 2024-04-14: qty 1

## 2024-04-14 MED ORDER — ONDANSETRON HCL 4 MG/2ML IJ SOLN
4.0000 mg | Freq: Once | INTRAMUSCULAR | Status: AC
  Administered 2024-04-14: 20:00:00 4 mg via INTRAVENOUS
  Filled 2024-04-14: qty 2

## 2024-04-14 MED ORDER — FENTANYL CITRATE (PF) 50 MCG/ML IJ SOSY
50.0000 ug | PREFILLED_SYRINGE | Freq: Once | INTRAMUSCULAR | Status: AC
  Administered 2024-04-14: 20:00:00 50 ug via INTRAVENOUS
  Filled 2024-04-14: qty 1

## 2024-04-14 NOTE — ED Notes (Signed)
 Pt Sons emergency number was not correct and the person who has that number has no relation to her

## 2024-04-14 NOTE — ED Triage Notes (Signed)
 First nurse note: Pt to ED via [facility] from home. Pt reports lower chronic back and in route reported CP. HX of hernia, MI and HTN. EMS placed pt on 2L [place] for comfort.   153/71 90 HR  RR 20 95% 2L [place]  CBG 120

## 2024-04-14 NOTE — ED Provider Notes (Signed)
 [facility] Provider Note    Event Date/Time   First MD Initiated Contact with Patient 04/14/24 1812     (approximate)   History   Back Pain and Chest Pain   HPI  Meghan Jimenez is a 63 y.o. female with a past medical history of alcoholic cirrhosis of the liver, diabetes, COPD, chronic back pain, CHF, hepatitis C, CAD, large ventral hernia, presenting to the emergency department via EMS for back pain.  The patient reports that she has been having a flareup of this pain and that it is in her neck and in her lower back.  She reports that the pain is making her legs feel weak and making it difficult to walk.  She also reports some numbness and cramping in her arms.  She denies any urinary retention, bowel incontinence.  She reports that she has some urinary incontinence with coughing that is normal for her.  She also reports that she has a large ventral hernia that has been present since she had open heart surgery in 2000 that her chest is hurting her at this time.     Physical Exam   Triage Vital Signs: ED Triage Vitals  Encounter Vitals Group     BP 04/14/24 1640 (!) 164/76     Girls Systolic BP Percentile --      Girls Diastolic BP Percentile --      Boys Systolic BP Percentile --      Boys Diastolic BP Percentile --      Pulse Rate 04/14/24 1636 92     Resp 04/14/24 1636 18     Temp 04/14/24 1636 98.4 F (36.9 C)     Temp Source 04/14/24 1636 Oral     SpO2 04/14/24 1636 95 %     Weight 04/14/24 1637 200 lb (90.7 kg)     Height 04/14/24 1637 5' 4 (1.626 m)     Head Circumference --      Peak Flow --      Pain Score 04/14/24 1637 7     Pain Loc --      Pain Education --      Exclude from Growth Chart --     Most recent vital signs: Vitals:   04/14/24 1636 04/14/24 1640  BP:  (!) 164/76  Pulse: 92   Resp: 18   Temp: 98.4 F (36.9 C)   SpO2: 95%      General: Awake, no distress.  CV:  Good peripheral perfusion.  Resp:  Normal  effort.  Abd:  Large, soft, easily reducible ventral hernia with no overlying skin changes Other:  Neurologic exam is extremely inconsistent. Bilateral upper and lower extremity strength varies from 1/5 to 5/5 depending on patient effort.   ED Results / Procedures / Treatments   Labs (all labs ordered are listed, but only abnormal results are displayed) Labs Reviewed  BASIC METABOLIC PANEL WITH GFR - Abnormal; Notable for the following components:      Result Value   Sodium 130 (*)    Chloride 95 (*)    Glucose, Bld 105 (*)    All other components within normal limits  CBC - Abnormal; Notable for the following components:   Hemoglobin 10.4 (*)    HCT 34.1 (*)    MCV 66.7 (*)    MCH 20.4 (*)    RDW 18.2 (*)    All other components within normal limits  TROPONIN T, HIGH SENSITIVITY  TROPONIN T, HIGH  SENSITIVITY     EKG ED ECG REPORT I, Reche CHRISTELLA Leventhal, the attending physician, personally viewed and interpreted this ECG.  Date: 04/14/2024 Rate: 88 bpm Rhythm: normal sinus rhythm QRS Axis: normal Intervals: normal ST/T Wave abnormalities: normal Narrative Interpretation: no evidence of acute ischemia     RADIOLOGY Chest x-ray was reviewed and independently interpreted by myself as no acute pathology.  Radiology notes known anterior chest wall hernia containing transverse colon  Cervical CT: IMPRESSION: 1. C3-4 central disc protrusion and mild spinal canal narrowing. 2. Additional mild degenerative changes. 3. Status post C5-C7 ACDF.  Thoracic CT: IMPRESSION: 1. Mild degenerative changes of the lower thoracic spine, most prominent at T11-T12.  Lumbar CT: IMPRESSION: 1. Posterior osteophytic ridging at L2-3 with moderate spinal canal narrowing. 2. Additional mild to moderate degenerative changes, as above. 3. Status post PLIF at L5-S1.  Lumbar MRI: IMPRESSION: 1. Multilevel lumbar spondylosis, most pronounced at L3-4 and L4-5 where there is resultant  severe multifactorial spinal stenosis. 2. Degenerative disc disease with facet hypertrophy at L2-3 with resultant moderate spinal stenosis. 3. Mild to moderate bilateral L1 through L4 foraminal narrowing as above, overall worse on the left. 4. Prior plif at L5-S1 without residual or recurrent stenosis. SABRA   PROCEDURES:  Critical Care performed: No  Procedures   MEDICATIONS ORDERED IN ED: Medications  fentaNYL  (SUBLIMAZE ) injection 50 mcg (has no administration in time range)  ondansetron  (ZOFRAN ) injection 4 mg (has no administration in time range)     IMPRESSION / MDM / ASSESSMENT AND PLAN / ED COURSE  I reviewed the triage vital signs and the nursing notes.                              Differential diagnosis includes, but is not limited to, vertebral fracture, spinal stenosis, radiculopathy  Patient's presentation is most consistent with acute presentation with potential threat to life or bodily function.  Patient is a 64 year old female with a past medical history of alcoholic cirrhosis of the liver, diabetes, COPD, chronic back pain, CHF, hepatitis C, CAD, large ventral hernia, presenting to the emergency department via EMS for back pain.  Given the patient's complaints CT of the cervical, thoracic, and lumbar spine were ordered for further evaluation.  Lab work, EKG, and chest x-ray were also performed.  Patient symptoms treated with fentanyl  and Zofran .  The patient's lumbar CT shows spinal canal narrowing.  The patient continues to report difficulty walking and weakness in her legs.  Given her complaints MRI of the lumbar spine was ordered.  MRI of the lumbar spine shows  Multilevel lumbar spondylosis, most pronounced at L3-4 and L4-5 where there is resultant severe multifactorial spinal stenosis, degenerative disc disease, and other nonacute findings.  There are no acute findings that require emergent intervention tonight.  I discussed results with the patient and  discussed need to follow-up with her primary care physician as well as with a neurosurgeon.  I will provide her with the contact information for the neurosurgery clinic however I advised her to speak with her PCP about a referral.  The patient is requesting an albuterol  inhaler here in the emergency department stating that she has 1 at home that she uses and feels like she needs a treatment.  This will be ordered.  Discussed with plan for discharge and the patient is agreeable.  She will return to the emergency department for any new or worsening symptoms.  FINAL CLINICAL IMPRESSION(S) / ED DIAGNOSES   Final diagnoses:  Spinal stenosis of lumbar region at multiple levels  Neck pain     Rx / DC Orders   ED Discharge Orders     None        Note:  This document was prepared using Dragon voice recognition software and may include unintentional dictation errors.   Kimberla Driskill M, MD 04/14/24 2158

## 2024-04-14 NOTE — Telephone Encounter (Signed)
°  Copied from CRM [id number]. Topic: Clinical - Red Word Triage >> Apr 14, 2024  2:39 PM Susanna ORN wrote: Red Word that prompted transfer to Nurse Triage: Patient states she's having back problems and can barely walk. She states she has screws in her back from a surgery in 2000. Wants to be seen but is not a patient of a current primary clinic. Reason for Disposition  [1] SEVERE back pain (e.g., excruciating) AND [2] sudden onset AND [3] age > 60 years  Answer Assessment - Initial Assessment Questions 1. ONSET: When did the pain begin? (e.g., minutes, hours, days)     One month  2. LOCATION: Where does it hurt? (upper, mid or lower back)     Entire back, surgery 2000 3. SEVERITY: How bad is the pain?  (e.g., Scale 1-10; mild, moderate, or severe)     Causing difficulty ambulating 4. PATTERN: Is the pain constant? (e.g., yes, no; constant, intermittent)      Constant  5. RADIATION: Does the pain shoot into your legs or somewhere else?      6. CAUSE:  What do you think is causing the back pain?      Previous surgery 7. BACK OVERUSE:  Any recent lifting of heavy objects, strenuous work or exercise?      8. MEDICINES: What have you taken so far for the pain? (e.g., nothing, acetaminophen , NSAIDS)      9. NEUROLOGIC SYMPTOMS: Do you have any weakness, numbness, or problems with bowel/bladder control?     Falls 10. OTHER SYMPTOMS: Do you have any other symptoms? (e.g., fever, abdomen pain, burning with urination, blood in urine)        11. PREGNANCY: Is there any chance you are pregnant? When was your last menstrual period?  Protocols used: Back Pain-A-AH

## 2024-04-14 NOTE — ED Notes (Signed)
 Patient transported to CT

## 2024-06-09 ENCOUNTER — Ambulatory Visit: Admitting: Surgery
